# Patient Record
Sex: Male | Born: 1960 | Hispanic: Yes | Marital: Married | Smoking: Never smoker
Health system: Southern US, Community
[De-identification: ages and names within clinical notes are randomized; demographics above are authoritative.]

## PROBLEM LIST (undated history)

## (undated) DIAGNOSIS — K219 Gastro-esophageal reflux disease without esophagitis: Secondary | ICD-10-CM

## (undated) DIAGNOSIS — E119 Type 2 diabetes mellitus without complications: Secondary | ICD-10-CM

## (undated) DIAGNOSIS — I10 Essential (primary) hypertension: Secondary | ICD-10-CM

## (undated) DIAGNOSIS — E7800 Pure hypercholesterolemia, unspecified: Secondary | ICD-10-CM

## (undated) DIAGNOSIS — R600 Localized edema: Secondary | ICD-10-CM

## (undated) DIAGNOSIS — Q613 Polycystic kidney, unspecified: Secondary | ICD-10-CM

## (undated) DIAGNOSIS — E785 Hyperlipidemia, unspecified: Secondary | ICD-10-CM

## (undated) DIAGNOSIS — N179 Acute kidney failure, unspecified: Secondary | ICD-10-CM

## (undated) DIAGNOSIS — K810 Acute cholecystitis: Secondary | ICD-10-CM

## (undated) DIAGNOSIS — M1990 Unspecified osteoarthritis, unspecified site: Secondary | ICD-10-CM

## (undated) DIAGNOSIS — I872 Venous insufficiency (chronic) (peripheral): Secondary | ICD-10-CM

## (undated) DIAGNOSIS — L97209 Non-pressure chronic ulcer of unspecified calf with unspecified severity: Secondary | ICD-10-CM

## (undated) HISTORY — DX: Acute cholecystitis: K81.0

## (undated) HISTORY — DX: Essential (primary) hypertension: I10

## (undated) HISTORY — DX: Type 2 diabetes mellitus without complications: E11.9

## (undated) HISTORY — DX: Non-pressure chronic ulcer of unspecified calf with unspecified severity: L97.209

## (undated) HISTORY — PX: VEIN SURGERY: SHX48

## (undated) HISTORY — DX: Localized edema: R60.0

## (undated) HISTORY — DX: Venous insufficiency (chronic) (peripheral): I87.2

---

## 2006-09-18 ENCOUNTER — Other Ambulatory Visit: Payer: Self-pay

## 2006-09-18 ENCOUNTER — Emergency Department: Payer: Self-pay | Admitting: Emergency Medicine

## 2007-04-12 ENCOUNTER — Ambulatory Visit: Payer: Self-pay | Admitting: Family Medicine

## 2011-03-31 ENCOUNTER — Emergency Department: Payer: Self-pay | Admitting: Emergency Medicine

## 2011-05-17 ENCOUNTER — Emergency Department: Payer: Self-pay

## 2011-06-27 ENCOUNTER — Emergency Department: Payer: Self-pay | Admitting: Emergency Medicine

## 2011-06-27 LAB — URINALYSIS, COMPLETE
Bacteria: NONE SEEN
Glucose,UR: NEGATIVE mg/dL (ref 0–75)
Ketone: NEGATIVE
Protein: NEGATIVE
Specific Gravity: 1.019 (ref 1.003–1.030)

## 2011-09-21 ENCOUNTER — Emergency Department: Payer: Self-pay | Admitting: Emergency Medicine

## 2011-10-02 ENCOUNTER — Ambulatory Visit: Payer: Self-pay | Admitting: Internal Medicine

## 2011-11-10 ENCOUNTER — Encounter: Payer: Self-pay | Admitting: Cardiothoracic Surgery

## 2011-11-10 ENCOUNTER — Encounter: Payer: Self-pay | Admitting: Nurse Practitioner

## 2011-12-03 ENCOUNTER — Encounter: Payer: Self-pay | Admitting: Cardiothoracic Surgery

## 2011-12-03 ENCOUNTER — Encounter: Payer: Self-pay | Admitting: Nurse Practitioner

## 2012-03-06 ENCOUNTER — Encounter: Payer: Self-pay | Admitting: Cardiothoracic Surgery

## 2012-03-06 ENCOUNTER — Encounter: Payer: Self-pay | Admitting: Nurse Practitioner

## 2012-04-03 ENCOUNTER — Encounter: Payer: Self-pay | Admitting: Cardiothoracic Surgery

## 2012-04-03 ENCOUNTER — Encounter: Payer: Self-pay | Admitting: Nurse Practitioner

## 2013-01-04 ENCOUNTER — Ambulatory Visit: Payer: Self-pay | Admitting: Family Medicine

## 2013-08-29 DIAGNOSIS — L97209 Non-pressure chronic ulcer of unspecified calf with unspecified severity: Secondary | ICD-10-CM | POA: Insufficient documentation

## 2013-08-29 HISTORY — DX: Non-pressure chronic ulcer of unspecified calf with unspecified severity: L97.209

## 2014-01-02 DIAGNOSIS — R600 Localized edema: Secondary | ICD-10-CM

## 2014-01-02 HISTORY — DX: Localized edema: R60.0

## 2014-01-16 DIAGNOSIS — I872 Venous insufficiency (chronic) (peripheral): Secondary | ICD-10-CM

## 2014-01-16 HISTORY — DX: Venous insufficiency (chronic) (peripheral): I87.2

## 2016-12-09 DIAGNOSIS — R112 Nausea with vomiting, unspecified: Secondary | ICD-10-CM | POA: Diagnosis not present

## 2016-12-09 DIAGNOSIS — R1013 Epigastric pain: Secondary | ICD-10-CM | POA: Insufficient documentation

## 2016-12-09 DIAGNOSIS — I10 Essential (primary) hypertension: Secondary | ICD-10-CM | POA: Diagnosis not present

## 2016-12-09 DIAGNOSIS — E119 Type 2 diabetes mellitus without complications: Secondary | ICD-10-CM | POA: Insufficient documentation

## 2016-12-09 DIAGNOSIS — E860 Dehydration: Secondary | ICD-10-CM | POA: Diagnosis not present

## 2016-12-09 DIAGNOSIS — N289 Disorder of kidney and ureter, unspecified: Secondary | ICD-10-CM | POA: Diagnosis not present

## 2016-12-09 DIAGNOSIS — R109 Unspecified abdominal pain: Secondary | ICD-10-CM | POA: Diagnosis present

## 2016-12-10 ENCOUNTER — Emergency Department
Admission: EM | Admit: 2016-12-10 | Discharge: 2016-12-10 | Disposition: A | Discharge status: Home / Self Care | Payer: Medicaid Other | Attending: Emergency Medicine | Admitting: Emergency Medicine

## 2016-12-10 ENCOUNTER — Encounter: Payer: Self-pay | Admitting: Radiology

## 2016-12-10 ENCOUNTER — Emergency Department: Payer: Medicaid Other

## 2016-12-10 ENCOUNTER — Encounter: Payer: Self-pay | Admitting: Emergency Medicine

## 2016-12-10 ENCOUNTER — Inpatient Hospital Stay
Admission: EM | Admit: 2016-12-10 | Discharge: 2016-12-13 | DRG: 418 | Disposition: A | Discharge status: Home / Self Care | Payer: Medicaid Other | Attending: General Surgery | Admitting: General Surgery

## 2016-12-10 DIAGNOSIS — Z23 Encounter for immunization: Secondary | ICD-10-CM

## 2016-12-10 DIAGNOSIS — E118 Type 2 diabetes mellitus with unspecified complications: Secondary | ICD-10-CM

## 2016-12-10 DIAGNOSIS — Z6841 Body Mass Index (BMI) 40.0 and over, adult: Secondary | ICD-10-CM

## 2016-12-10 DIAGNOSIS — E119 Type 2 diabetes mellitus without complications: Secondary | ICD-10-CM

## 2016-12-10 DIAGNOSIS — K819 Cholecystitis, unspecified: Secondary | ICD-10-CM

## 2016-12-10 DIAGNOSIS — E785 Hyperlipidemia, unspecified: Secondary | ICD-10-CM | POA: Diagnosis present

## 2016-12-10 DIAGNOSIS — E7800 Pure hypercholesterolemia, unspecified: Secondary | ICD-10-CM | POA: Diagnosis present

## 2016-12-10 DIAGNOSIS — R0602 Shortness of breath: Secondary | ICD-10-CM

## 2016-12-10 DIAGNOSIS — R112 Nausea with vomiting, unspecified: Secondary | ICD-10-CM

## 2016-12-10 DIAGNOSIS — Z01811 Encounter for preprocedural respiratory examination: Secondary | ICD-10-CM

## 2016-12-10 DIAGNOSIS — K810 Acute cholecystitis: Principal | ICD-10-CM | POA: Diagnosis present

## 2016-12-10 DIAGNOSIS — E860 Dehydration: Secondary | ICD-10-CM

## 2016-12-10 DIAGNOSIS — K219 Gastro-esophageal reflux disease without esophagitis: Secondary | ICD-10-CM | POA: Diagnosis present

## 2016-12-10 DIAGNOSIS — R1013 Epigastric pain: Secondary | ICD-10-CM

## 2016-12-10 DIAGNOSIS — I10 Essential (primary) hypertension: Secondary | ICD-10-CM | POA: Diagnosis present

## 2016-12-10 DIAGNOSIS — N289 Disorder of kidney and ureter, unspecified: Secondary | ICD-10-CM

## 2016-12-10 DIAGNOSIS — Z833 Family history of diabetes mellitus: Secondary | ICD-10-CM

## 2016-12-10 HISTORY — DX: Type 2 diabetes mellitus without complications: E11.9

## 2016-12-10 HISTORY — DX: Gastro-esophageal reflux disease without esophagitis: K21.9

## 2016-12-10 HISTORY — DX: Essential (primary) hypertension: I10

## 2016-12-10 HISTORY — DX: Unspecified osteoarthritis, unspecified site: M19.90

## 2016-12-10 HISTORY — DX: Hyperlipidemia, unspecified: E78.5

## 2016-12-10 HISTORY — DX: Polycystic kidney, unspecified: Q61.3

## 2016-12-10 HISTORY — DX: Acute kidney failure, unspecified: N17.9

## 2016-12-10 HISTORY — DX: Pure hypercholesterolemia, unspecified: E78.00

## 2016-12-10 LAB — CBC WITH DIFFERENTIAL/PLATELET
Basophils Absolute: 0.1 10*3/uL (ref 0–0.1)
Basophils Relative: 1 %
EOS ABS: 0 10*3/uL (ref 0–0.7)
Eosinophils Relative: 0 %
HEMATOCRIT: 41.1 % (ref 40.0–52.0)
HEMOGLOBIN: 14.5 g/dL (ref 13.0–18.0)
Lymphocytes Relative: 5 %
Lymphs Abs: 1.2 10*3/uL (ref 1.0–3.6)
MCH: 31.2 pg (ref 26.0–34.0)
MCHC: 35.2 g/dL (ref 32.0–36.0)
MCV: 88.7 fL (ref 80.0–100.0)
MONOS PCT: 5 %
Monocytes Absolute: 1.2 10*3/uL — ABNORMAL HIGH (ref 0.2–1.0)
NEUTROS PCT: 89 %
Neutro Abs: 21.3 10*3/uL — ABNORMAL HIGH (ref 1.4–6.5)
Platelets: 210 10*3/uL (ref 150–440)
RBC: 4.63 MIL/uL (ref 4.40–5.90)
RDW: 12.8 % (ref 11.5–14.5)
WBC: 23.9 10*3/uL — ABNORMAL HIGH (ref 3.8–10.6)

## 2016-12-10 LAB — URINALYSIS, COMPLETE (UACMP) WITH MICROSCOPIC
BACTERIA UA: NONE SEEN
Bilirubin Urine: NEGATIVE
Glucose, UA: >=500 mg/dL — AB
Ketones, ur: NEGATIVE mg/dL
Leukocytes, UA: NEGATIVE
Nitrite: NEGATIVE
Protein, ur: 30 mg/dL — AB
SPECIFIC GRAVITY, URINE: 1.024 (ref 1.005–1.030)
SQUAMOUS EPITHELIAL / LPF: NONE SEEN
pH: 5 (ref 5.0–8.0)

## 2016-12-10 LAB — CBC
HCT: 45.5 % (ref 40.0–52.0)
HEMOGLOBIN: 15.9 g/dL (ref 13.0–18.0)
MCH: 31.1 pg (ref 26.0–34.0)
MCHC: 35 g/dL (ref 32.0–36.0)
MCV: 88.9 fL (ref 80.0–100.0)
Platelets: 249 10*3/uL (ref 150–440)
RBC: 5.11 MIL/uL (ref 4.40–5.90)
RDW: 13.1 % (ref 11.5–14.5)
WBC: 15.3 10*3/uL — AB (ref 3.8–10.6)

## 2016-12-10 LAB — COMPREHENSIVE METABOLIC PANEL
ALBUMIN: 3.6 g/dL (ref 3.5–5.0)
ALBUMIN: 4.4 g/dL (ref 3.5–5.0)
ALK PHOS: 121 U/L (ref 38–126)
ALT: 20 U/L (ref 17–63)
ALT: 29 U/L (ref 17–63)
ANION GAP: 10 (ref 5–15)
AST: 19 U/L (ref 15–41)
AST: 32 U/L (ref 15–41)
Alkaline Phosphatase: 86 U/L (ref 38–126)
Anion gap: 8 (ref 5–15)
BILIRUBIN TOTAL: 0.7 mg/dL (ref 0.3–1.2)
BUN: 20 mg/dL (ref 6–20)
BUN: 43 mg/dL — AB (ref 6–20)
CALCIUM: 9.3 mg/dL (ref 8.9–10.3)
CHLORIDE: 98 mmol/L — AB (ref 101–111)
CO2: 22 mmol/L (ref 22–32)
CO2: 25 mmol/L (ref 22–32)
Calcium: 8.9 mg/dL (ref 8.9–10.3)
Chloride: 101 mmol/L (ref 101–111)
Creatinine, Ser: 0.97 mg/dL (ref 0.61–1.24)
Creatinine, Ser: 1.59 mg/dL — ABNORMAL HIGH (ref 0.61–1.24)
GFR calc Af Amer: 54 mL/min — ABNORMAL LOW (ref >60)
GFR calc Af Amer: >60 mL/min (ref >60)
GFR calc non Af Amer: >60 mL/min (ref >60)
GFR, EST NON AFRICAN AMERICAN: 46 mL/min — AB (ref >60)
GLUCOSE: 336 mg/dL — AB (ref 65–99)
GLUCOSE: 385 mg/dL — AB (ref 65–99)
POTASSIUM: 4.2 mmol/L (ref 3.5–5.1)
POTASSIUM: 4.5 mmol/L (ref 3.5–5.1)
Sodium: 131 mmol/L — ABNORMAL LOW (ref 135–145)
Sodium: 133 mmol/L — ABNORMAL LOW (ref 135–145)
TOTAL PROTEIN: 8.7 g/dL — AB (ref 6.5–8.1)
Total Bilirubin: 1.4 mg/dL — ABNORMAL HIGH (ref 0.3–1.2)
Total Protein: 7.6 g/dL (ref 6.5–8.1)

## 2016-12-10 LAB — LIPASE, BLOOD
LIPASE: 47 U/L (ref 11–51)
Lipase: 37 U/L (ref 11–51)

## 2016-12-10 LAB — ETHANOL

## 2016-12-10 LAB — LACTIC ACID, PLASMA: LACTIC ACID, VENOUS: 1.6 mmol/L (ref 0.5–1.9)

## 2016-12-10 LAB — TROPONIN I

## 2016-12-10 MED ORDER — MORPHINE SULFATE (PF) 4 MG/ML IV SOLN
4.0000 mg | Freq: Once | INTRAVENOUS | Status: AC
  Administered 2016-12-10: 4 mg via INTRAVENOUS
  Filled 2016-12-10: qty 1

## 2016-12-10 MED ORDER — ONDANSETRON 4 MG PO TBDP: 4.0000 mg | ORAL_TABLET | Freq: Three times a day (TID) | ORAL | 0 refills | Status: DC | PRN

## 2016-12-10 MED ORDER — FENTANYL CITRATE (PF) 100 MCG/2ML IJ SOLN
50.0000 ug | Freq: Once | INTRAMUSCULAR | Status: AC
  Administered 2016-12-10: 50 ug via INTRAVENOUS
  Filled 2016-12-10: qty 2

## 2016-12-10 MED ORDER — HYDROMORPHONE HCL 1 MG/ML IJ SOLN
0.5000 mg | Freq: Once | INTRAMUSCULAR | Status: AC
  Administered 2016-12-10: 0.5 mg via INTRAVENOUS
  Filled 2016-12-10: qty 1

## 2016-12-10 MED ORDER — ONDANSETRON HCL 4 MG/2ML IJ SOLN
4.0000 mg | Freq: Once | INTRAMUSCULAR | Status: AC
  Administered 2016-12-10: 4 mg via INTRAVENOUS
  Filled 2016-12-10: qty 2

## 2016-12-10 MED ORDER — SODIUM CHLORIDE 0.9 % IV BOLUS (SEPSIS)
1000.0000 mL | Freq: Once | INTRAVENOUS | Status: AC
  Administered 2016-12-10: 1000 mL via INTRAVENOUS

## 2016-12-10 MED ORDER — FAMOTIDINE IN NACL 20-0.9 MG/50ML-% IV SOLN
20.0000 mg | Freq: Once | INTRAVENOUS | Status: AC
  Administered 2016-12-10: 20 mg via INTRAVENOUS
  Filled 2016-12-10: qty 50

## 2016-12-10 MED ORDER — PIPERACILLIN-TAZOBACTAM 3.375 G IVPB 30 MIN
3.3750 g | Freq: Once | INTRAVENOUS | Status: AC
  Administered 2016-12-10: 3.375 g via INTRAVENOUS

## 2016-12-10 MED ORDER — SODIUM CHLORIDE 0.9 % IV BOLUS (SEPSIS)
1000.0000 mL | INTRAVENOUS | Status: AC
  Administered 2016-12-10: 1000 mL via INTRAVENOUS

## 2016-12-10 MED ORDER — ONDANSETRON HCL 4 MG/2ML IJ SOLN
4.0000 mg | INTRAMUSCULAR | Status: AC
  Administered 2016-12-10: 4 mg via INTRAVENOUS
  Filled 2016-12-10: qty 2

## 2016-12-10 MED ORDER — TRAMADOL HCL 50 MG PO TABS: 50.0000 mg | ORAL_TABLET | Freq: Four times a day (QID) | ORAL | 0 refills | Status: DC | PRN

## 2016-12-10 MED ORDER — IOPAMIDOL (ISOVUE-300) INJECTION 61%
100.0000 mL | Freq: Once | INTRAVENOUS | Status: AC | PRN
  Administered 2016-12-10: 100 mL via INTRAVENOUS

## 2016-12-10 MED ORDER — PIPERACILLIN-TAZOBACTAM 3.375 G IVPB 30 MIN
INTRAVENOUS | Status: AC
  Administered 2016-12-10: 3.375 g via INTRAVENOUS
  Filled 2016-12-10: qty 50

## 2016-12-10 NOTE — ED Triage Notes (Signed)
Pt was seen here last night and discharged at 5am, pt states the abd pain has not improved..states pain started around 5pm yesterday..Marland Kitchen

## 2016-12-10 NOTE — ED Triage Notes (Signed)
Pt to tx room direct from triage, report epigastric pain x 6 hours with vomiting x 3, denies diarrhea.    Triage and assessment completed w/ help of tele-interpreter

## 2016-12-10 NOTE — ED Provider Notes (Signed)
[facility] Emergency Department Provider Note   ____________________________________________   First MD Initiated Contact with Patient 12/10/16 0010     (approximate)  I have reviewed the triage vital signs and the nursing notes.   HISTORY  Chief Complaint Abdominal Pain    HPI Richard Hunt is a 56 y.o. male who presents to the ED from home with a chief complaint of abdominal pain, nausea and vomiting. Patient reports onset of epigastric pain approximately 6 PM after eating beef and tortillas.notes sharp, nonradiating epigastric pain which has been constant. Symptoms associated with nausea and vomiting 3. Denies associated fever, chills, chest pain, shortness of breath, dysuria, diarrhea. Denies recent travel or trauma. Nothing makes his symptoms better or worse. No similar symptoms previously.   Past Medical History:  Diagnosis Date  . Diabetes mellitus without complication (HCC)   . Hypertension     There are no active problems to display for this patient.   History reviewed. No pertinent surgical history.  Prior to Admission medications   Not on File    Allergies Patient has no known allergies.  History reviewed. No pertinent family history.  Social History Social History  Substance Use Topics  . Smoking status: Never Smoker  . Smokeless tobacco: Never Used  . Alcohol use No    Review of Systems  Constitutional: No fever/chills. Eyes: No visual changes. ENT: No sore throat. Cardiovascular: Denies chest pain. Respiratory: Denies shortness of breath. Gastrointestinal: positive for abdominal pain, nausea and vomiting.  No diarrhea.  No constipation. Genitourinary: Negative for dysuria. Musculoskeletal: Negative for back pain. Skin: Negative for rash. Neurological: Negative for headaches, focal weakness or numbness.   ____________________________________________   PHYSICAL EXAM:  VITAL SIGNS: ED Triage Vitals   Enc Vitals Group     BP 12/10/16 0008 (!) 190/97     Pulse Rate 12/10/16 0008 89     Resp 12/10/16 0008 18     Temp 12/10/16 0008 97.7 F (36.5 C)     Temp Source 12/10/16 0008 Oral     SpO2 12/10/16 0008 100 %     Weight 12/10/16 0013 240 lb (108.9 kg)     Height 12/10/16 0013  (1.651 m)     Head Circumference --      Peak Flow --      Pain Score 12/10/16 0013 10     Pain Loc --      Pain Edu? --      Excl. in GC? --     Constitutional: Alert and oriented. Well appearing and in moderate acute distress. Tearful. Eyes: Conjunctivae are normal. PERRL. EOMI. Head: Atraumatic. Nose: No congestion/rhinnorhea. Mouth/Throat: Mucous membranes are moist.  Oropharynx non-erythematous. Neck: No stridor.   Cardiovascular: Normal rate, regular rhythm. Grossly normal heart sounds.  Good peripheral circulation. Respiratory: Normal respiratory effort.  No retractions. Lungs CTAB. Gastrointestinal: Soft and moderately tender to palpation epigastrium with voluntary guarding, no rebound. No distention. No abdominal bruits. No CVA tenderness. Musculoskeletal: No lower extremity tenderness nor edema.  No joint effusions. Neurologic:  Normal speech and language. No gross focal neurologic deficits are appreciated.  Skin:  Skin is warm, dry and intact. No rash noted. Psychiatric: Mood and affect are normal. Speech and behavior are normal.  ____________________________________________   LABS (all labs ordered are listed, but only abnormal results are displayed)  Labs Reviewed  COMPREHENSIVE METABOLIC PANEL - Abnormal; Notable for the following:       Result Value  Sodium 133 (*)    Glucose, Bld 385 (*)    BUN 43 (*)    Creatinine, Ser 1.59 (*)    Total Protein 8.7 (*)    GFR calc non Af Amer 46 (*)    GFR calc Af Amer 54 (*)    All other components within normal limits  CBC - Abnormal; Notable for the following:    WBC 15.3 (*)    All other components within normal limits    URINALYSIS, COMPLETE (UACMP) WITH MICROSCOPIC - Abnormal; Notable for the following:    Color, Urine STRAW (*)    APPearance CLEAR (*)    Glucose, UA >=500 (*)    Hgb urine dipstick SMALL (*)    Protein, ur 30 (*)    All other components within normal limits  LIPASE, BLOOD  TROPONIN I  ETHANOL   ____________________________________________  EKG  ED ECG REPORT I, Altan Kraai J, the attending physician, personally viewed and interpreted this ECG.   Date: 12/10/2016  EKG Time: 0003  Rate: 86  Rhythm: normal EKG, normal sinus rhythm  Axis: normal  Intervals:none  ST&T Change: nonspecific  ____________________________________________  RADIOLOGY  US Abdomen Limited Ruq  Result Date: 12/10/2016 CLINICAL DATA:  Epigastric pain.  Nausea and vomiting. EXAM: ULTRASOUND ABDOMEN LIMITED RIGHT UPPER QUADRANT COMPARISON:  None. FINDINGS: Gallbladder: Physiologically distended. Layering sludge without shadowing stone. No gallbladder wall thickening or pericholecystic fluid. No sonographic Murphy sign noted by sonographer. Common bile duct: Diameter: 6 mm. Liver: Increased heterogeneous hepatic parenchyma, difficult to penetrate. No evidence of gross focal lesion. Portal vein is patent on color Doppler imaging with normal direction of blood flow towards the liver. IMPRESSION: 1. Gallbladder sludge without gallstones or gallbladder inflammation. No biliary dilatation. 2. Hepatic steatosis, liver parenchyma is difficult to penetrate. Electronically Signed   By: Rubye Oaks M.D.   On: 12/10/2016 02:28    ____________________________________________   PROCEDURES  Procedure(s) performed: None  Procedures  Critical Care performed: No  ____________________________________________   INITIAL IMPRESSION / ASSESSMENT AND PLAN / ED COURSE  Pertinent labs & imaging results that were available during my care of the patient were reviewed by me and considered in my medical decision making (see  chart for details).  56 year old male with diabetes and hypertension who presents with epigastric pain with vomiting. Laboratory results notable for leukocytosis, hyperglycemia and renal insufficiency. No prior labs for comparison. Symptoms clinically suspicious for cholecystitis. No relief after initial dose of IV morphine. Will administer additional analgesia and obtain abdominal ultrasound.  Clinical Course as of Dec 10 501  Wynelle Link Dec 10, 2016  1610 Updated patient and family members of ultrasound results. I had the chance to review ultrasound results directly with surgeon on call Dr. Earlene Plater. Currently there is no indication that patient requires urgent surgical intervention. Dr. Earlene Plater did recommend fentanyl for analgesic and conversion to oral tramadol. Patient's pain is improved and currently 7/10 after Dilaudid. he is no longer tearful and smiling. Will administer fentanyl bolus and reassess.  [JS]  0501 Pain is much improved. patient was sleeping comfortably and had to be awakened for reexamination. Will discharge home on tramadol with outpatient surgery follow-up. Strict return precautions given. Patient and family members verbalize understanding and agree with plan of care.  [JS]    Clinical Course User Index [JS] Irean Hong, MD     ____________________________________________   FINAL CLINICAL IMPRESSION(S) / ED DIAGNOSES  Final diagnoses:  Epigastric pain  Non-intractable vomiting with nausea, unspecified vomiting  type  Renal insufficiency  Dehydration      NEW MEDICATIONS STARTED DURING THIS VISIT:  New Prescriptions   No medications on file     Note:  This document was prepared using Dragon voice recognition software and may include unintentional dictation errors.    Irean HongSung, Rheagan Nayak J, MD 12/10/16 [phone]

## 2016-12-10 NOTE — ED Notes (Signed)
Pt actively vomiting.

## 2016-12-10 NOTE — ED Notes (Signed)
Pt taken to US

## 2016-12-10 NOTE — Discharge Instructions (Signed)
1. You may take pain and nausea medicines as needed (tramadol/Zofran #20). 2. Parke SimmersBland diet 5 days, then slowly advance diet as tolerated. Avoid fatty, greasy, spicy meals and alcohol. 3. Return to the ER for worsening symptoms, persistent vomiting, difficulty breathing or other concerns.

## 2016-12-10 NOTE — ED Provider Notes (Signed)
[facility] Emergency Department Provider Note  ____________________________________________   First MD Initiated Contact with Patient 12/10/16 2226     (approximate)  I have reviewed the triage vital signs and the nursing notes.   HISTORY  Chief Complaint Abdominal Pain  The patient and/or family speak(s) Spanish.  They understand they have the right to the use of a hospital interpreter, however at this time they prefer to speak directly with me in Spanish.  They know that they can ask for an interpreter at any time.   HPI Richard Hunt is a 56 y.o. male with medical history as listed below presents for reevaluation of abdominal pain.  He was seen overnight last night for the same symptoms.His symptoms started acutely yesterday evening shortly after eating dinner.  He reports sharp and nonradiating epigastric pain that has been constant since that time.  He has had some nausea and multiple episodes of vomiting yesterday although he has not vomited today.  He came to the emergency department and his labs were generally reassuring with a slight elevation of his creatinine and a mild leukocytosis of 15.3.  He had a generally reassuring ultrasound as well with some gallbladder sludge but no wall thickening or stones and no ductal dilatation.  The ED physician spoke with the surgeon on call and agreed that if his symptoms were adequately controlled with pain medicine he could follow-up as an outpatient.  Symptoms were much improved and he agreed with the plan to go home.    He reports that the pain WAS improved when he left the hospital early this morning.  He went home and went to sleep, but when he woke up his pain was back and severe.  Kidneys to be sharp and located primarily in his epigastrium and right upper quadrant.  Nothing makes his symptoms better or worse when he tried to eat.  Today he has had nausea but no more vomiting.  His pain is severe tonight  so he came back to the emergency department for reevaluation.  He is unaware of having any fevers.  He denies chest pain and shortness of breath.  He has had no diarrhea.   Past Medical History:  Diagnosis Date  . Diabetes mellitus without complication (HCC)   . Hypertension     There are no active problems to display for this patient.   No past surgical history on file.  Prior to Admission medications   Medication Sig Start Date End Date Taking? Authorizing Provider  ondansetron (ZOFRAN ODT) 4 MG disintegrating tablet Take 1 tablet (4 mg total) by mouth every 8 (eight) hours as needed for nausea or vomiting. 12/10/16  Yes Irean HongSung, Jade J, MD  traMADol (ULTRAM) 50 MG tablet Take 1 tablet (50 mg total) by mouth every 6 (six) hours as needed. 12/10/16  Yes Irean HongSung, Jade J, MD    Allergies Patient has no known allergies.  No family history on file.  Social History Social History  Substance Use Topics  . Smoking status: Never Smoker  . Smokeless tobacco: Never Used  . Alcohol use No    Review of Systems Constitutional: No fever/chills Eyes: No visual changes. ENT: No sore throat. Cardiovascular: Denies chest pain. Respiratory: Denies shortness of breath. Gastrointestinal: Severe epigastric and right upper quadrant pain with nausea, but no vomiting since yesterday.  No diarrhea. Genitourinary: Negative for dysuria. Musculoskeletal: Negative for neck pain.  Negative for back pain. Integumentary: Negative for rash. Neurological: Negative for headaches, focal  weakness or numbness.   ____________________________________________   PHYSICAL EXAM:  VITAL SIGNS: ED Triage Vitals  Enc Vitals Group     BP 12/10/16 1745 (!) 168/79     Pulse Rate 12/10/16 1745 (!) 117     Resp --      Temp 12/10/16 1745 99.8 F (37.7 C)     Temp Source 12/10/16 1745 Oral     SpO2 12/10/16 1745 94 %     Weight 12/10/16 1747 110.2 kg (243 lb)     Height 12/10/16 1747 1.651 m ( )     Head  Circumference --      Peak Flow --      Pain Score 12/10/16 1756 8     Pain Loc --      Pain Edu? --      Excl. in GC? --     Constitutional: Alert and oriented. Appears uncomfortable Eyes: Conjunctivae are normal.  Head: Atraumatic. Nose: No congestion/rhinnorhea. Mouth/Throat: Mucous membranes are moist. Neck: No stridor.  No meningeal signs.   Cardiovascular: Normal rate, regular rhythm. Good peripheral circulation. Grossly normal heart sounds. Respiratory: Normal respiratory effort.  No retractions. Lungs CTAB. Gastrointestinal: Obese.  Severe epigastric and right upper quadrant tenderness with guarding.  Mild distention. Musculoskeletal: No lower extremity tenderness nor edema. No gross deformities of extremities. Neurologic:  Normal speech and language. No gross focal neurologic deficits are appreciated.  Skin:  Skin is warm, dry and intact. No rash noted. Psychiatric: Mood and affect are normal. Speech and behavior are normal.  ____________________________________________   LABS (all labs ordered are listed, but only abnormal results are displayed)  Labs Reviewed  COMPREHENSIVE METABOLIC PANEL - Abnormal; Notable for the following:       Result Value   Sodium 131 (*)    Chloride 98 (*)    Glucose, Bld 336 (*)    Total Bilirubin 1.4 (*)    All other components within normal limits  CBC WITH DIFFERENTIAL/PLATELET - Abnormal; Notable for the following:    WBC 23.9 (*)    Neutro Abs 21.3 (*)    Monocytes Absolute 1.2 (*)    All other components within normal limits  LIPASE, BLOOD  LACTIC ACID, PLASMA  TROPONIN I  LACTIC ACID, PLASMA   ____________________________________________  EKG  ED ECG REPORT I, Natisha Trzcinski, the attending physician, personally viewed and interpreted this ECG.  Date: 12/10/2016 EKG Time: 22:09 Rate: 103 Rhythm: normal sinus rhythm QRS Axis: normal Intervals: normal ST/T Wave abnormalities: normal Narrative Interpretation: no  evidence of acute ischemia  ____________________________________________  RADIOLOGY   Ct Abdomen Pelvis W Contrast  Result Date: 12/10/2016 CLINICAL DATA:  Abdominal pain with nausea and vomiting which began at 6 p.m. tonight. EXAM: CT ABDOMEN AND PELVIS WITH CONTRAST TECHNIQUE: Multidetector CT imaging of the abdomen and pelvis was performed using the standard protocol following bolus administration of intravenous contrast. CONTRAST:  100 ml ISOVUE-300 IOPAMIDOL (ISOVUE-300) INJECTION 61% COMPARISON:  Right upper quadrant ultrasound this same day. FINDINGS: Lower chest: Mild dependent atelectasis is seen lung bases. No pleural or pericardial effusion. Heart size is normal. Hepatobiliary: Fatty infiltration of the liver is identified. No focal lesion. There is stranding about the gallbladder. No stones are identified. Pancreas: Unremarkable. No pancreatic ductal dilatation or surrounding inflammatory changes. Spleen: Normal in size without focal abnormality. Adrenals/Urinary Tract: 2 small left renal cysts are identified. There is a punctate low attenuating lesion the right kidney which is also likely a cyst. The kidneys are  otherwise unremarkable. Ureters and urinary bladder appear normal. The adrenal glands are normal appearance P Stomach/Bowel: Stomach is within normal limits. Appendix appears normal. No evidence of bowel wall thickening, distention, or inflammatory changes. Vascular/Lymphatic: Aortic atherosclerosis. No enlarged abdominal or pelvic lymph nodes. Reproductive: The prostate gland is mildly prominent. Other: No ascites. Very small fat containing umbilical hernia noted. Musculoskeletal: No lytic or sclerotic bony lesion. No acute bony abnormality. IMPRESSION: Stranding about the gallbladder worrisome for inflammatory change. Note is made the patient's right upper quadrant ultrasound showed only some gallbladder sludge. Fatty infiltration of the liver. Atherosclerosis. Mild prostatomegaly.  Very small fat containing umbilical hernia. Electronically Signed   By: Drusilla Kanner M.D.   On: 12/10/2016 23:27   US Abdomen Limited Ruq  Result Date: 12/10/2016 CLINICAL DATA:  Epigastric pain.  Nausea and vomiting. EXAM: ULTRASOUND ABDOMEN LIMITED RIGHT UPPER QUADRANT COMPARISON:  None. FINDINGS: Gallbladder: Physiologically distended. Layering sludge without shadowing stone. No gallbladder wall thickening or pericholecystic fluid. No sonographic Murphy sign noted by sonographer. Common bile duct: Diameter: 6 mm. Liver: Increased heterogeneous hepatic parenchyma, difficult to penetrate. No evidence of gross focal lesion. Portal vein is patent on color Doppler imaging with normal direction of blood flow towards the liver. IMPRESSION: 1. Gallbladder sludge without gallstones or gallbladder inflammation. No biliary dilatation. 2. Hepatic steatosis, liver parenchyma is difficult to penetrate. Electronically Signed   By: Rubye Oaks M.D.   On: 12/10/2016 02:28    ____________________________________________   PROCEDURES  Critical Care performed: No   Procedure(s) performed:   Procedures   ____________________________________________   INITIAL IMPRESSION / ASSESSMENT AND PLAN / ED COURSE  Pertinent labs & imaging results that were available during my care of the patient were reviewed by me and considered in my medical decision making (see chart for details).    Clinical Course as of Dec 10 2356  Wynelle Link Dec 10, 2016  2301 Talked to Dr. Earlene Plater with general surgery about the fact the patient's white count has gone up to 24 since last night in spite of his any function improving, his ongoing pain, etc.  He feels we should obtain a CT scan because biliary colic/gallbladder sludge would be very unlikely to cause that substantial of a leukocytosis.  I will proceed with a CT scan of the abdomen and pelvis with IV contrast only and we will reassess.  He will see the patient after the CT scan  is available for review.  In the meantime I am giving a liter of fluids, analgesia and antiemetics, and under the circumstances I will give him a dose of empiric antibiotics.  [CF]  2336 CT scan is back showing inflammatory changes around the gallbladder.  I discussed the case with Dr. Earlene Plater and he will come down to see the patient and admit him for probable cholecystectomy.  [CF]    Clinical Course User Index [CF] Loleta Rose, MD    ____________________________________________  FINAL CLINICAL IMPRESSION(S) / ED DIAGNOSES  Final diagnoses:  Cholecystitis     MEDICATIONS GIVEN DURING THIS VISIT:  Medications  sodium chloride 0.9 % bolus 1,000 mL (1,000 mLs Intravenous New Bag/Given 12/10/16 2327)  fentaNYL (SUBLIMAZE) injection 50 mcg (50 mcg Intravenous Given 12/10/16 2328)  ondansetron (ZOFRAN) injection 4 mg (4 mg Intravenous Given 12/10/16 2327)  piperacillin-tazobactam (ZOSYN) IVPB 3.375 g (3.375 g Intravenous New Bag/Given 12/10/16 2328)  iopamidol (ISOVUE-300) 61 % injection 100 mL (100 mLs Intravenous Contrast Given 12/10/16 2308)     NEW OUTPATIENT MEDICATIONS STARTED  DURING THIS VISIT:  New Prescriptions   No medications on file    Modified Medications   No medications on file    Discontinued Medications   No medications on file     Note:  This document was prepared using Dragon voice recognition software and may include unintentional dictation errors.    Loleta Rose, MD 12/10/16 2358

## 2016-12-11 ENCOUNTER — Observation Stay: Payer: Medicaid Other | Admitting: Anesthesiology

## 2016-12-11 ENCOUNTER — Observation Stay: Payer: Medicaid Other

## 2016-12-11 ENCOUNTER — Encounter
Admission: EM | Disposition: A | Discharge status: Home / Self Care | Payer: Self-pay | Source: Home / Self Care | Attending: General Surgery

## 2016-12-11 ENCOUNTER — Encounter: Payer: Self-pay | Admitting: Internal Medicine

## 2016-12-11 DIAGNOSIS — I10 Essential (primary) hypertension: Secondary | ICD-10-CM

## 2016-12-11 DIAGNOSIS — K810 Acute cholecystitis: Secondary | ICD-10-CM | POA: Diagnosis not present

## 2016-12-11 DIAGNOSIS — E119 Type 2 diabetes mellitus without complications: Secondary | ICD-10-CM

## 2016-12-11 DIAGNOSIS — E785 Hyperlipidemia, unspecified: Secondary | ICD-10-CM | POA: Diagnosis present

## 2016-12-11 HISTORY — DX: Type 2 diabetes mellitus without complications: E11.9

## 2016-12-11 HISTORY — DX: Acute cholecystitis: K81.0

## 2016-12-11 HISTORY — PX: CHOLECYSTECTOMY: SHX55

## 2016-12-11 HISTORY — DX: Essential (primary) hypertension: I10

## 2016-12-11 LAB — COMPREHENSIVE METABOLIC PANEL
ALBUMIN: 3.3 g/dL — AB (ref 3.5–5.0)
ALK PHOS: 79 U/L (ref 38–126)
ALT: 29 U/L (ref 17–63)
AST: 30 U/L (ref 15–41)
Anion gap: 8 (ref 5–15)
BILIRUBIN TOTAL: 1.8 mg/dL — AB (ref 0.3–1.2)
BUN: 20 mg/dL (ref 6–20)
CALCIUM: 8.4 mg/dL — AB (ref 8.9–10.3)
CO2: 25 mmol/L (ref 22–32)
CREATININE: 1.08 mg/dL (ref 0.61–1.24)
Chloride: 99 mmol/L — ABNORMAL LOW (ref 101–111)
GFR calc Af Amer: >60 mL/min (ref >60)
GFR calc non Af Amer: >60 mL/min (ref >60)
GLUCOSE: 346 mg/dL — AB (ref 65–99)
Potassium: 4.3 mmol/L (ref 3.5–5.1)
SODIUM: 132 mmol/L — AB (ref 135–145)
Total Protein: 7 g/dL (ref 6.5–8.1)

## 2016-12-11 LAB — LIPID PANEL
CHOL/HDL RATIO: 3.4 ratio
Cholesterol: 156 mg/dL (ref 0–200)
HDL: 46 mg/dL (ref >40)
LDL CALC: 85 mg/dL (ref 0–99)
Triglycerides: 127 mg/dL (ref <150)
VLDL: 25 mg/dL (ref 0–40)

## 2016-12-11 LAB — SURGICAL PCR SCREEN
MRSA, PCR: NEGATIVE
Staphylococcus aureus: POSITIVE — AB

## 2016-12-11 LAB — CBC
HCT: 38 % — ABNORMAL LOW (ref 40.0–52.0)
Hemoglobin: 13.6 g/dL (ref 13.0–18.0)
MCH: 31.9 pg (ref 26.0–34.0)
MCHC: 35.8 g/dL (ref 32.0–36.0)
MCV: 89 fL (ref 80.0–100.0)
PLATELETS: 182 10*3/uL (ref 150–440)
RBC: 4.27 MIL/uL — ABNORMAL LOW (ref 4.40–5.90)
RDW: 12.9 % (ref 11.5–14.5)
WBC: 22.2 10*3/uL — ABNORMAL HIGH (ref 3.8–10.6)

## 2016-12-11 LAB — GLUCOSE, CAPILLARY
GLUCOSE-CAPILLARY: 297 mg/dL — AB (ref 65–99)
GLUCOSE-CAPILLARY: 236 mg/dL — AB (ref 65–99)
GLUCOSE-CAPILLARY: 316 mg/dL — AB (ref 65–99)
Glucose-Capillary: 295 mg/dL — ABNORMAL HIGH (ref 65–99)
Glucose-Capillary: 214 mg/dL — ABNORMAL HIGH (ref 65–99)
Glucose-Capillary: 277 mg/dL — ABNORMAL HIGH (ref 65–99)
Glucose-Capillary: 164 mg/dL — ABNORMAL HIGH (ref 65–99)

## 2016-12-11 LAB — TROPONIN I
TROPONIN I: 0.06 ng/mL — AB (ref <0.03)
Troponin I: <0.03 ng/mL (ref <0.03)

## 2016-12-11 LAB — LACTIC ACID, PLASMA: LACTIC ACID, VENOUS: 1.3 mmol/L (ref 0.5–1.9)

## 2016-12-11 SURGERY — LAPAROSCOPIC CHOLECYSTECTOMY
Anesthesia: General | Wound class: Clean Contaminated

## 2016-12-11 MED ORDER — DEXTROSE 5 % IV SOLN
2.0000 g | INTRAVENOUS | Status: DC
  Administered 2016-12-11: 2 g via INTRAVENOUS
  Filled 2016-12-11 (×3): qty 2

## 2016-12-11 MED ORDER — LIDOCAINE HCL (PF) 1 % IJ SOLN
INTRAMUSCULAR | Status: AC
  Filled 2016-12-11: qty 30

## 2016-12-11 MED ORDER — MUPIROCIN 2 % EX OINT
1.0000 "application " | TOPICAL_OINTMENT | Freq: Two times a day (BID) | CUTANEOUS | Status: DC
  Administered 2016-12-11 – 2016-12-13 (×5): 1 via NASAL
  Filled 2016-12-11: qty 22

## 2016-12-11 MED ORDER — ROCURONIUM BROMIDE 50 MG/5ML IV SOLN
INTRAVENOUS | Status: AC
  Filled 2016-12-11: qty 1

## 2016-12-11 MED ORDER — BUPIVACAINE HCL (PF) 0.5 % IJ SOLN
INTRAMUSCULAR | Status: AC
  Filled 2016-12-11: qty 30

## 2016-12-11 MED ORDER — ACETAMINOPHEN 325 MG PO TABS
650.0000 mg | ORAL_TABLET | Freq: Four times a day (QID) | ORAL | Status: DC | PRN
  Administered 2016-12-13: 650 mg via ORAL
  Filled 2016-12-11: qty 2

## 2016-12-11 MED ORDER — FENTANYL CITRATE (PF) 100 MCG/2ML IJ SOLN
50.0000 ug | INTRAMUSCULAR | Status: DC | PRN
  Administered 2016-12-11: 50 ug via INTRAVENOUS
  Filled 2016-12-11: qty 2

## 2016-12-11 MED ORDER — ONDANSETRON HCL 4 MG/2ML IJ SOLN
4.0000 mg | Freq: Four times a day (QID) | INTRAMUSCULAR | Status: DC | PRN
  Administered 2016-12-11: 4 mg via INTRAVENOUS
  Filled 2016-12-11: qty 2

## 2016-12-11 MED ORDER — ONDANSETRON HCL 4 MG/2ML IJ SOLN
INTRAMUSCULAR | Status: AC
  Filled 2016-12-11: qty 2

## 2016-12-11 MED ORDER — ENOXAPARIN SODIUM 40 MG/0.4ML ~~LOC~~ SOLN
40.0000 mg | SUBCUTANEOUS | Status: DC
  Administered 2016-12-12 – 2016-12-13 (×2): 40 mg via SUBCUTANEOUS
  Filled 2016-12-11 (×2): qty 0.4

## 2016-12-11 MED ORDER — FENTANYL CITRATE (PF) 100 MCG/2ML IJ SOLN
25.0000 ug | INTRAMUSCULAR | Status: DC | PRN
  Administered 2016-12-11: 50 ug via INTRAVENOUS

## 2016-12-11 MED ORDER — FENTANYL CITRATE (PF) 100 MCG/2ML IJ SOLN
INTRAMUSCULAR | Status: AC
  Filled 2016-12-11: qty 2

## 2016-12-11 MED ORDER — INSULIN ASPART 100 UNIT/ML ~~LOC~~ SOLN
0.0000 [IU] | SUBCUTANEOUS | Status: DC
  Administered 2016-12-11: 15 [IU] via SUBCUTANEOUS
  Administered 2016-12-11: 4 [IU] via SUBCUTANEOUS
  Administered 2016-12-11 (×3): 11 [IU] via SUBCUTANEOUS
  Administered 2016-12-12: 7 [IU] via SUBCUTANEOUS
  Administered 2016-12-12: 4 [IU] via SUBCUTANEOUS
  Administered 2016-12-12: 3 [IU] via SUBCUTANEOUS
  Administered 2016-12-12: 7 [IU] via SUBCUTANEOUS
  Administered 2016-12-12: 3 [IU] via SUBCUTANEOUS
  Administered 2016-12-12: 7 [IU] via SUBCUTANEOUS
  Administered 2016-12-13 (×2): 3 [IU] via SUBCUTANEOUS
  Administered 2016-12-13: 4 [IU] via SUBCUTANEOUS
  Filled 2016-12-11 (×13): qty 1

## 2016-12-11 MED ORDER — PROPOFOL 10 MG/ML IV BOLUS
INTRAVENOUS | Status: AC
  Filled 2016-12-11: qty 20

## 2016-12-11 MED ORDER — LACTATED RINGERS IV SOLN
INTRAVENOUS | Status: DC
  Administered 2016-12-11 – 2016-12-12 (×6): via INTRAVENOUS

## 2016-12-11 MED ORDER — PNEUMOCOCCAL VAC POLYVALENT 25 MCG/0.5ML IJ INJ
0.5000 mL | INJECTION | INTRAMUSCULAR | Status: AC
  Administered 2016-12-12: 0.5 mL via INTRAMUSCULAR
  Filled 2016-12-11: qty 0.5

## 2016-12-11 MED ORDER — OXYCODONE HCL 5 MG/5ML PO SOLN: 5.0000 mg | Freq: Once | ORAL | Status: DC | PRN

## 2016-12-11 MED ORDER — FENTANYL CITRATE (PF) 100 MCG/2ML IJ SOLN
INTRAMUSCULAR | Status: DC | PRN
  Administered 2016-12-11: 100 ug via INTRAVENOUS
  Administered 2016-12-11 (×3): 50 ug via INTRAVENOUS

## 2016-12-11 MED ORDER — OXYCODONE HCL 5 MG PO TABS: 5.0000 mg | ORAL_TABLET | Freq: Once | ORAL | Status: DC | PRN

## 2016-12-11 MED ORDER — SUGAMMADEX SODIUM 500 MG/5ML IV SOLN
INTRAVENOUS | Status: AC
  Filled 2016-12-11: qty 5

## 2016-12-11 MED ORDER — INSULIN GLARGINE 100 UNIT/ML ~~LOC~~ SOLN
20.0000 [IU] | Freq: Every day | SUBCUTANEOUS | Status: DC
  Administered 2016-12-11 – 2016-12-12 (×2): 20 [IU] via SUBCUTANEOUS
  Filled 2016-12-11 (×3): qty 0.2

## 2016-12-11 MED ORDER — MIDAZOLAM HCL 2 MG/2ML IJ SOLN
INTRAMUSCULAR | Status: DC | PRN
  Administered 2016-12-11: 2 mg via INTRAVENOUS

## 2016-12-11 MED ORDER — LIDOCAINE HCL (PF) 2 % IJ SOLN
INTRAMUSCULAR | Status: AC
  Filled 2016-12-11: qty 2

## 2016-12-11 MED ORDER — ACETAMINOPHEN 650 MG RE SUPP: 650.0000 mg | Freq: Four times a day (QID) | RECTAL | Status: DC | PRN

## 2016-12-11 MED ORDER — BUPIVACAINE HCL 0.5 % IJ SOLN
INTRAMUSCULAR | Status: DC | PRN
  Administered 2016-12-11: 20 mL via INTRAMUSCULAR

## 2016-12-11 MED ORDER — OXYCODONE-ACETAMINOPHEN 5-325 MG PO TABS
1.0000 | ORAL_TABLET | ORAL | Status: DC | PRN
  Administered 2016-12-11 – 2016-12-12 (×4): 1 via ORAL
  Administered 2016-12-13: 2 via ORAL
  Filled 2016-12-11 (×4): qty 1
  Filled 2016-12-11: qty 2
  Filled 2016-12-11: qty 1

## 2016-12-11 MED ORDER — PROPOFOL 10 MG/ML IV BOLUS
INTRAVENOUS | Status: DC | PRN
  Administered 2016-12-11: 50 mg via INTRAVENOUS
  Administered 2016-12-11: 160 mg via INTRAVENOUS

## 2016-12-11 MED ORDER — CEFAZOLIN SODIUM-DEXTROSE 1-4 GM/50ML-% IV SOLN
INTRAVENOUS | Status: DC | PRN
  Administered 2016-12-11: 2 g via INTRAVENOUS

## 2016-12-11 MED ORDER — MIDAZOLAM HCL 2 MG/2ML IJ SOLN
INTRAMUSCULAR | Status: AC
  Filled 2016-12-11: qty 2

## 2016-12-11 MED ORDER — PIPERACILLIN-TAZOBACTAM 3.375 G IVPB
3.3750 g | Freq: Three times a day (TID) | INTRAVENOUS | Status: DC
  Administered 2016-12-11 – 2016-12-13 (×6): 3.375 g via INTRAVENOUS
  Filled 2016-12-11 (×6): qty 50

## 2016-12-11 MED ORDER — ROCURONIUM BROMIDE 100 MG/10ML IV SOLN
INTRAVENOUS | Status: DC | PRN
  Administered 2016-12-11: 40 mg via INTRAVENOUS
  Administered 2016-12-11: 10 mg via INTRAVENOUS
  Administered 2016-12-11: 5 mg via INTRAVENOUS

## 2016-12-11 MED ORDER — LIDOCAINE HCL (CARDIAC) 20 MG/ML IV SOLN
INTRAVENOUS | Status: DC | PRN
  Administered 2016-12-11: 100 mg via INTRAVENOUS

## 2016-12-11 MED ORDER — CHLORHEXIDINE GLUCONATE CLOTH 2 % EX PADS
6.0000 | MEDICATED_PAD | Freq: Every day | CUTANEOUS | Status: DC
  Administered 2016-12-11 – 2016-12-13 (×3): 6 via TOPICAL

## 2016-12-11 MED ORDER — ONDANSETRON 4 MG PO TBDP: 4.0000 mg | ORAL_TABLET | Freq: Four times a day (QID) | ORAL | Status: DC | PRN

## 2016-12-11 MED ORDER — CHLORHEXIDINE GLUCONATE CLOTH 2 % EX PADS: 6.0000 | MEDICATED_PAD | Freq: Once | CUTANEOUS | Status: DC

## 2016-12-11 MED ORDER — PHENYLEPHRINE HCL 10 MG/ML IJ SOLN
INTRAMUSCULAR | Status: DC | PRN
  Administered 2016-12-11: 200 ug via INTRAVENOUS
  Administered 2016-12-11: 100 ug via INTRAVENOUS

## 2016-12-11 MED ORDER — SUCCINYLCHOLINE CHLORIDE 20 MG/ML IJ SOLN
INTRAMUSCULAR | Status: DC | PRN
  Administered 2016-12-11: 100 mg via INTRAVENOUS

## 2016-12-11 MED ORDER — ONDANSETRON HCL 4 MG/2ML IJ SOLN
INTRAMUSCULAR | Status: DC | PRN
Start: 2016-12-11 — End: 2016-12-11
  Administered 2016-12-11: 4 mg via INTRAVENOUS

## 2016-12-11 MED ORDER — SUGAMMADEX SODIUM 500 MG/5ML IV SOLN
INTRAVENOUS | Status: DC | PRN
  Administered 2016-12-11: 220 mg via INTRAVENOUS

## 2016-12-11 SURGICAL SUPPLY — 43 items
ADHESIVE MASTISOL STRL (MISCELLANEOUS) ×3 IMPLANT
APPLIER CLIP ROT 10 11.4 M/L (STAPLE) ×3
BLADE SURG SZ11 CARB STEEL (BLADE) ×3 IMPLANT
CANISTER SUCT 1200ML W/VALVE (MISCELLANEOUS) ×3 IMPLANT
CATH CHOLANG 76X19 KUMAR (CATHETERS) ×3 IMPLANT
CHLORAPREP W/TINT 26ML (MISCELLANEOUS) ×3 IMPLANT
CLIP APPLIE ROT 10 11.4 M/L (STAPLE) ×1 IMPLANT
CLOSURE WOUND 1/2 X4 (GAUZE/BANDAGES/DRESSINGS)
CONRAY 60ML FOR OR (MISCELLANEOUS) IMPLANT
DECANTER SPIKE VIAL GLASS SM (MISCELLANEOUS) ×6 IMPLANT
DRAPE SHEET LG 3/4 BI-LAMINATE (DRAPES) ×3 IMPLANT
DRSG TEGADERM 2-3/8X2-3/4 SM (GAUZE/BANDAGES/DRESSINGS) ×12 IMPLANT
DRSG TELFA 4X3 1S NADH ST (GAUZE/BANDAGES/DRESSINGS) ×3 IMPLANT
ELECT REM PT RETURN 9FT ADLT (ELECTROSURGICAL) ×3
ELECTRODE REM PT RTRN 9FT ADLT (ELECTROSURGICAL) ×1 IMPLANT
GLOVE BIO SURGEON STRL SZ7.5 (GLOVE) ×3 IMPLANT
GLOVE INDICATOR 8.0 STRL GRN (GLOVE) ×3 IMPLANT
GOWN STRL REUS W/ TWL LRG LVL3 (GOWN DISPOSABLE) ×3 IMPLANT
GOWN STRL REUS W/TWL LRG LVL3 (GOWN DISPOSABLE) ×6
GRASPER SUT TROCAR 14GX15 (MISCELLANEOUS) IMPLANT
IRRIGATION STRYKERFLOW (MISCELLANEOUS) ×1 IMPLANT
IRRIGATOR STRYKERFLOW (MISCELLANEOUS) ×3
IV NS 1000ML (IV SOLUTION) ×2
IV NS 1000ML BAXH (IV SOLUTION) ×1 IMPLANT
L-HOOK LAP DISP 36CM (ELECTROSURGICAL) ×3
LABEL OR SOLS (LABEL) ×3 IMPLANT
LHOOK LAP DISP 36CM (ELECTROSURGICAL) ×1 IMPLANT
NEEDLE HYPO 25X1 1.5 SAFETY (NEEDLE) ×3 IMPLANT
NEEDLE VERESS 14GA 120MM (NEEDLE) ×3 IMPLANT
NS IRRIG 500ML POUR BTL (IV SOLUTION) ×3 IMPLANT
PACK LAP CHOLECYSTECTOMY (MISCELLANEOUS) ×3 IMPLANT
PENCIL ELECTRO HAND CTR (MISCELLANEOUS) ×3 IMPLANT
POUCH ENDO CATCH 10MM SPEC (MISCELLANEOUS) ×3 IMPLANT
SCISSORS METZENBAUM CVD 33 (INSTRUMENTS) ×3 IMPLANT
SLEEVE ENDOPATH XCEL 5M (ENDOMECHANICALS) ×6 IMPLANT
STRIP CLOSURE SKIN 1/2X4 (GAUZE/BANDAGES/DRESSINGS) IMPLANT
SUT MNCRL 4-0 (SUTURE) ×2
SUT MNCRL 4-0 27XMFL (SUTURE) ×1
SUT VICRYL 0 AB UR-6 (SUTURE) IMPLANT
SUTURE MNCRL 4-0 27XMF (SUTURE) ×1 IMPLANT
TROCAR XCEL 12X100 BLDLESS (ENDOMECHANICALS) ×3 IMPLANT
TROCAR XCEL NON-BLD 5MMX100MML (ENDOMECHANICALS) ×3 IMPLANT
TUBING INSUFFLATOR HI FLOW (MISCELLANEOUS) ×3 IMPLANT

## 2016-12-11 NOTE — Progress Notes (Signed)
[facility] - [place] [facility]   PATIENT NAME: Richard Hunt    MR#:  [id number]  DATE OF BIRTH:  [birth year]  SUBJECTIVE:  CHIEF COMPLAINT:   Chief Complaint  Patient presents with  . Abdominal Pain   The patient has mild abdominal pain, status post laparoscopy cholecystectomy REVIEW OF SYSTEMS:  Review of Systems  Constitutional: Negative for chills, fever and malaise/fatigue.  HENT: Negative for sore throat.   Eyes: Negative for blurred vision and double vision.  Respiratory: Negative for cough, hemoptysis, shortness of breath, wheezing and stridor.   Cardiovascular: Negative for chest pain, palpitations, orthopnea and leg swelling.  Gastrointestinal: Positive for abdominal pain. Negative for blood in stool, diarrhea, melena, nausea and vomiting.  Genitourinary: Negative for dysuria, flank pain and hematuria.  Musculoskeletal: Negative for back pain and joint pain.  Neurological: Negative for dizziness, sensory change, focal weakness, seizures, loss of consciousness, weakness and headaches.  Endo/Heme/Allergies: Negative for polydipsia.  Psychiatric/Behavioral: Negative for depression. The patient is not nervous/anxious.     DRUG ALLERGIES:  No Known Allergies VITALS:  Blood pressure (!) 144/83, pulse 100, temperature 97.9 F (36.6 C), resp. rate 20, height  (1.651 m), weight 243 lb (110.2 kg), SpO2 97 %. PHYSICAL EXAMINATION:  Physical Exam  Constitutional: He is oriented to person, place, and time.  Morbid obesity  HENT:  Head: Normocephalic.  Mouth/Throat: Oropharynx is clear and moist.  Eyes: Conjunctivae and EOM are normal. No scleral icterus.  Neck: Normal range of motion. Neck supple. No JVD present. No tracheal deviation present.  Cardiovascular: Normal rate, regular rhythm and normal heart sounds.  Exam reveals no gallop.   No murmur heard. Pulmonary/Chest: Effort normal and breath sounds normal. No respiratory distress.  He has no wheezes. He has no rales.  Abdominal: Soft. He exhibits no distension. There is tenderness. There is no rebound.  no bowel sounds.  Musculoskeletal: Normal range of motion. He exhibits no edema or tenderness.  Neurological: He is alert and oriented to person, place, and time. No cranial nerve deficit.  Skin: No rash noted. No erythema.  Psychiatric: Affect normal.   LABORATORY PANEL:  Male CBC  Recent Labs Lab 12/11/16 0303  WBC 22.2*  HGB 13.6  HCT 38.0*  PLT 182   ------------------------------------------------------------------------------------------------------------------ Chemistries   Recent Labs Lab 12/11/16 0303  NA 132*  K 4.3  CL 99*  CO2 25  GLUCOSE 346*  BUN 20  CREATININE 1.08  CALCIUM 8.4*  AST 30  ALT 29  ALKPHOS 79  BILITOT 1.8*   RADIOLOGY:  Dg Chest 2 View  Result Date: 12/11/2016 CLINICAL DATA:  56 year old male with a history of cholelithiasis. Preoperative chest x-ray EXAM: CHEST  2 VIEW COMPARISON:  None. FINDINGS: Cardiomediastinal silhouette within normal limits. No evidence of central vascular congestion. No pneumothorax or pleural effusion. Low lung volumes. No confluent airspace disease. Likely atelectasis. No displaced fracture IMPRESSION: Low lung volumes without evidence of acute cardiopulmonary disease Electronically Signed   By: Gilmer Mor D.O.   On: 12/11/2016 08:56   Ct Abdomen Pelvis W Contrast  Result Date: 12/10/2016 CLINICAL DATA:  Abdominal pain with nausea and vomiting which began at 6 p.m. tonight. EXAM: CT ABDOMEN AND PELVIS WITH CONTRAST TECHNIQUE: Multidetector CT imaging of the abdomen and pelvis was performed using the standard protocol following bolus administration of intravenous contrast. CONTRAST:  100 ml ISOVUE-300 IOPAMIDOL (ISOVUE-300) INJECTION 61% COMPARISON:  Right upper quadrant ultrasound this same day. FINDINGS: Lower chest:  Mild dependent atelectasis is seen lung bases. No pleural or pericardial  effusion. Heart size is normal. Hepatobiliary: Fatty infiltration of the liver is identified. No focal lesion. There is stranding about the gallbladder. No stones are identified. Pancreas: Unremarkable. No pancreatic ductal dilatation or surrounding inflammatory changes. Spleen: Normal in size without focal abnormality. Adrenals/Urinary Tract: 2 small left renal cysts are identified. There is a punctate low attenuating lesion the right kidney which is also likely a cyst. The kidneys are otherwise unremarkable. Ureters and urinary bladder appear normal. The adrenal glands are normal appearance P Stomach/Bowel: Stomach is within normal limits. Appendix appears normal. No evidence of bowel wall thickening, distention, or inflammatory changes. Vascular/Lymphatic: Aortic atherosclerosis. No enlarged abdominal or pelvic lymph nodes. Reproductive: The prostate gland is mildly prominent. Other: No ascites. Very small fat containing umbilical hernia noted. Musculoskeletal: No lytic or sclerotic bony lesion. No acute bony abnormality. IMPRESSION: Stranding about the gallbladder worrisome for inflammatory change. Note is made the patient's right upper quadrant ultrasound showed only some gallbladder sludge. Fatty infiltration of the liver. Atherosclerosis. Mild prostatomegaly. Very small fat containing umbilical hernia. Electronically Signed   By: Drusilla Kannerhomas  Dalessio M.D.   On: 12/10/2016 23:27   ASSESSMENT AND PLAN:    Acute cholecystitis with leukocytosis. Status post laparoscopy cholecystectomy today.  Continue the antibiotics. Pain control. Follow-up CBC.    Diabetes (HCC)  BS>200. On sliding scale insulin with corresponding glucose checks, add lantus HS.    HTN (hypertension). Controlled without hypertension medication.Marland Kitchen.    HLD (hyperlipidemia) Unremarkable lipid panel.  Morbid obesity.  All the records are reviewed and case discussed with Care Management/Social Worker. Management plans discussed with the  patient, family and they are in agreement.  CODE STATUS: Full Code  TOTAL TIME TAKING CARE OF THIS PATIENT: 32 minutes.   More than 50% of the time was spent in counseling/coordination of care: YES  POSSIBLE D/C IN 1-2 DAYS, DEPENDING ON CLINICAL CONDITION.   Shaune Pollackhen, Kalesha Irving M.D on 12/11/2016 at 3:52 PM  Between 7am to 6pm - Pager - [phone]  After 6pm go to [email or web address] - Therapist, nutritionalpassword [email or web address]  [facility]

## 2016-12-11 NOTE — H&P (Addendum)
SURGICAL ADMISSION HISTORY & PHYSICAL  HISTORY OF PRESENT ILLNESS (HPI):  The history below was obtained with the assistance of a certified medical Spanish-English interpreter.  56 y.o. obese male returned to [facility] ED tonight for worsening abdominal pain after having presented and been discharged home with outpatient surgical follow-up for similar but lesser and transient abdominal pain last night with findings of biliary sludge without cholecystitis on RUQ abdominal ultrasound. Patient reports the pain in his upper abdomen > RUQ first began yesterday ~4 pm after he ate beef and cheese tortillas. Though patient reports daily moderately severe heartburn, for which he takes only Tums, this pain he describes was different and worse than his heartburn and was associated with nausea without emesis. After returning home, the patient says he ate only a peach, after which his pain returned and worsened until he returned to the ED, since which the pain has improved, though not resolved.   Patient otherwise denies any prior similar episodes and denies fever/chills, CP, or SOB at rest. Though he says he can walk several blocks and up/down a flight of steps without CP or SOB, he frequently gets SOB with exertion working at his [employer] job. Of note, patient states he has a PMD that he last saw 1 year ago, at which time he was prescribed medications for DM, HTN, and high cholesterol, but patient has not since taken any of the medications prescribed, stating he did not like the way the medications made him feel. He was also told he knees B/L knee replacements.  Due to patient's uncontrolled GERD with epigastric pain, worsened leukocytosis, and a RUQ ultrasound last night demonstrating only biliary sludge without cholecystitis, CT abdominal imaging was advised and performed.  Surgery is consulted by ED physician Dr. York Cerise in this context for evaluation and management of acute cholecystitis.  PAST  MEDICAL HISTORY (PMH):      Past Medical History:  Diagnosis Date  . Acute kidney injury (nontraumatic) (HCC)   . Diabetes mellitus without complication (HCC)   . GERD (gastroesophageal reflux disease)   . HLD (hyperlipidemia)   . Hypercholesterolemia   . Hypertension   . Osteoarthritis   . Polycystic kidney disease     PAST SURGICAL HISTORY (PSH):  No past surgical history on file.   MEDICATIONS:         Prior to Admission medications   Medication Sig Start Date End Date Taking? Authorizing Provider  ondansetron (ZOFRAN ODT) 4 MG disintegrating tablet Take 1 tablet (4 mg total) by mouth every 8 (eight) hours as needed for nausea or vomiting. 12/10/16  Yes Irean Hong, MD  traMADol (ULTRAM) 50 MG tablet Take 1 tablet (50 mg total) by mouth every 6 (six) hours as needed. 12/10/16  Yes Irean Hong, MD     ALLERGIES:  No Known Allergies   SOCIAL HISTORY:  Social History   Social History  . Marital status: Married    Spouse name: N/A  . Number of children: N/A  . Years of education: N/A      Occupational History  . Not on file.       Social History Main Topics  . Smoking status: Never Smoker  . Smokeless tobacco: Never Used  . Alcohol use No  . Drug use: Unknown  . Sexual activity: Not on file       Other Topics Concern  . Not on file      Social History Narrative  . No narrative on file  The patient currently resides (home / rehab facility / nursing home): Home The patient normally is (ambulatory / bedbound): Ambulatory   FAMILY HISTORY:  No family history on file.   REVIEW OF SYSTEMS:  Constitutional: denies weight loss, fever, chills, or sweats  Eyes: denies any other vision changes, history of eye injury  ENT: denies sore throat, hearing problems  Respiratory: denies shortness of breath, wheezing  Cardiovascular: denies chest pain, palpitations  Gastrointestinal: abdominal pain, N/V, and bowel function as per  HPI Genitourinary: denies burning with urination or urinary frequency Musculoskeletal: denies any other joint pains or cramps  Skin: denies any other rashes or skin discolorations  Neurological: denies any other headache, dizziness, weakness  Psychiatric: denies any other depression, anxiety   All other review of systems were negative   VITAL SIGNS:  Temp:  [99.8 F (37.7 C)] 99.8 F (37.7 C) (09/09 1745) Pulse Rate:  [88-117] 88 (09/10 0000) Resp:  [15-20] 15 (09/10 0000) BP: (110-192)/(58-104) 110/68 (09/10 0000) SpO2:  [90 %-97 %] 95 % (09/10 0000) Weight:  [243 lb (110.2 kg)] 243 lb (110.2 kg) (09/09 1747)     Height: 5\' 5"  (165.1 cm) Weight: 243 lb (110.2 kg) BMI (Calculated): 40.44   INTAKE/OUTPUT:  This shift: No intake/output data recorded.  Last 2 shifts: @IOLAST2SHIFTS @   PHYSICAL EXAM:  Constitutional:  -- Obese body habitus  -- Awake, alert, and oriented x3  Eyes:  -- Pupils equally round and reactive to light  -- No scleral icterus  Ear, nose, and throat:  -- No jugular venous distension  Pulmonary:  -- No crackles  -- Equal breath sounds bilaterally -- Breathing non-labored at rest Cardiovascular:  -- S1, S2 present  -- No pericardial rubs Gastrointestinal:  -- Abdomen soft and obese, but non-distended with RUQ > epigastric tenderness to palpation, no guarding or rebound tenderness -- No abdominal masses appreciated, pulsatile or otherwise  Musculoskeletal and Integumentary:  -- Wounds or skin discoloration: None appreciated -- Extremities: B/L UE and LE FROM, hands and feet warm, no edema  Neurologic:  -- Motor function: intact and symmetric -- Sensation: intact and symmetric  Labs:  CBC Latest Ref Rng & Units 12/10/2016 12/10/2016  WBC 3.8 - 10.6 K/uL 23.9(H) 15.3(H)  Hemoglobin 13.0 - 18.0 g/dL 40.914.5 81.115.9  Hematocrit 91.440.0 - 52.0 % 41.1 45.5  Platelets 150 - 440 K/uL 210 249   CMP Latest Ref Rng & Units 12/10/2016 12/10/2016  Glucose 65 - 99 mg/dL  782(N336(H) 562(Z385(H)  BUN 6 - 20 mg/dL 20 30(Q43(H)  Creatinine 6.570.61 - 1.24 mg/dL 8.460.97 9.62(X1.59(H)  Sodium 528135 - 145 mmol/L 131(L) 133(L)  Potassium 3.5 - 5.1 mmol/L 4.2 4.5  Chloride 101 - 111 mmol/L 98(L) 101  CO2 22 - 32 mmol/L 25 22  Calcium 8.9 - 10.3 mg/dL 8.9 9.3  Total Protein 6.5 - 8.1 g/dL 7.6 4.1(L8.7(H)  Total Bilirubin 0.3 - 1.2 mg/dL 2.4(M1.4(H) 0.7  Alkaline Phos 38 - 126 U/L 86 121  AST 15 - 41 U/L 32 19  ALT 17 - 63 U/L 29 20   Imaging studies:  CT Abdomen and Pelvis with Contrast (12/10/2016) - personally reviewed and discussed with patient Hepatobiliary: Fatty infiltration of the liver is identified. No focal lesion. There is stranding about the gallbladder, worrisome  for inflammatory change. No stones are identified.  Stomach/Bowel: Stomach is within normal limits. Appendix  Appears normal. No evidence of bowel wall thickening, distention,  or inflammatory changes.  Pancreas: Unremarkable. No pancreatic ductal dilatation  or surrounding inflammatory changes.  Adrenals/Urinary Tract: 2 small left renal cysts are identified. There is a punctate low attenuating lesion the right kidney which is also likely a cyst. The kidneys are otherwise unremarkable. Ureters and urinary bladder appear normal. The adrenal glands are normal.  Limited RUQ Abdominal Ultrasound (12/10/2016) - personally reviewed and discussed with patient Gallbladder is physiologically distended. Layering sludge without  shadowing stone. No gallbladder wall thickening or pericholecystic  fluid. No sonographic Murphy sign noted by sonographer.  Common bile duct diameter measures 6 mm.  Assessment/Plan: (ICD-10's: K81.0) 56 y.o. male with acute cholecystitis attributable to biliary sludge (microcholelithiasis), complicated by leukocytosis, likely reactive hyperbilirubinemia, and resolved AKI, as well as by pertinent comorbidities including morbid obesity (BMI >40), polycystic kidney disease, osteoarthritis, and  uncontrolled DM, HTN, hypercholesterolemia, and GERD.              - NPO, IVF              - pain control prn, IV antibiotics             - repeat morning WBC (CBC) and bilirubin (CMP)             - all risks, benefits, and alternatives to cholecystectomy and subsequent recovery were discussed with patient and his wife (bedside), including that it will likely be Dr. Tonita Cong performing the surgery, all of their questions were answered to their expressed satisfaction, patient expresses he wishes to proceed, and informed consent was obtained.             - medical consultation for pre-op risk stratification and optimization and management of comorbidities             - anticipate laparoscopic cholecystectomy prior to discharge             - DVT prophylaxis  All of the above findings and recommendations were discussed with the patient and his wife (bedside), and all of patient's and his family's questions were answered to their expressed satisfaction.  -- Scherrie Gerlach Earlene Plater, MD, RPVI [place]: [facility] General Surgery - Partnering for exceptional care. Office: [phone]

## 2016-12-11 NOTE — Progress Notes (Signed)
  Patient admitted overnight with likely acute cholecystitis. Appreciate internal medicine assistance.  I discussed the procedure in detail. We discussed the risks and benefits of a laparoscopic cholecystectomy and possible cholangiogram including, but not limited to bleeding, infection, injury to surrounding structures such as the intestine or liver, bile leak, retained gallstones, need to convert to an open procedure, prolonged diarrhea, blood clots such as  DVT, common bile duct injury, anesthesia risks, and possible need for additional procedures.  The likelihood of improvement in symptoms and return to the patient's normal status is good. We discussed the typical post-operative recovery course.  Discussed with the patient, given his mildly elevated troponin this morning that it would be repeated. Depending on the results of that lab will determine whether or not we proceed with surgery or a percutaneous cholecystostomy.  Patient voiced understanding.   Ricarda Frameharles Zaydn Gutridge, MD [facility] General Surgeon [facility]  Day [facility] [phone](7a-7p) [phone] Night [facility] [phone](7p-7a) [phone]

## 2016-12-11 NOTE — Consult Note (Addendum)
SURGICAL CONSULTATION NOTE (initial) - cpt: 99244  HISTORY OF PRESENT ILLNESS (HPI):  The history below was obtained with the assistance of a certified medical Spanish-English interpreter.  56 y.o. obese male returned to [facility] ED tonight for worsening abdominal pain after having presented and been discharged home with outpatient surgical follow-up for similar but lesser and transient abdominal pain last night with findings of biliary sludge without cholecystitis on RUQ abdominal ultrasound. Patient reports the pain in his upper abdomen > RUQ first began yesterday ~4 pm after he ate beef and cheese tortillas. Though patient reports daily moderately severe heartburn, for which he takes only Tums, this pain he describes was different and worse than his heartburn and was associated with nausea without emesis. After returning home, the patient says he ate only a peach, after which his pain returned and worsened until he returned to the ED, since which the pain has improved, though not resolved.   Patient otherwise denies any prior similar episodes and denies fever/chills, CP, or SOB at rest. Though he says he can walk several blocks and up/down a flight of steps without CP or SOB, he frequently gets SOB with exertion working at his [employer] job. Of note, patient states he has a PMD that he last saw 1 year ago, at which time he was prescribed medications for DM, HTN, and high cholesterol, but patient has not since taken any of the medications prescribed, stating he did not like the way the medications made him feel. He was also told he knees B/L knee replacements.  Due to patient's uncontrolled GERD with epigastric pain, worsened leukocytosis, and a RUQ ultrasound last night demonstrating only biliary sludge without cholecystitis, CT abdominal imaging was advised and performed.  Surgery is consulted by ED physician Dr. York Cerise in this context for evaluation and management of acute cholecystitis.  PAST  MEDICAL HISTORY (PMH):  Past Medical History:  Diagnosis Date  . Acute kidney injury (nontraumatic) (HCC)   . Diabetes mellitus without complication (HCC)   . GERD (gastroesophageal reflux disease)   . HLD (hyperlipidemia)   . Hypercholesterolemia   . Hypertension   . Osteoarthritis   . Polycystic kidney disease     PAST SURGICAL HISTORY (PSH):  No past surgical history on file.   MEDICATIONS:  Prior to Admission medications   Medication Sig Start Date End Date Taking? Authorizing Provider  ondansetron (ZOFRAN ODT) 4 MG disintegrating tablet Take 1 tablet (4 mg total) by mouth every 8 (eight) hours as needed for nausea or vomiting. 12/10/16  Yes Irean Hong, MD  traMADol (ULTRAM) 50 MG tablet Take 1 tablet (50 mg total) by mouth every 6 (six) hours as needed. 12/10/16  Yes Irean Hong, MD     ALLERGIES:  No Known Allergies   SOCIAL HISTORY:  Social History   Social History  . Marital status: Married    Spouse name: N/A  . Number of children: N/A  . Years of education: N/A   Occupational History  . Not on file.   Social History Main Topics  . Smoking status: Never Smoker  . Smokeless tobacco: Never Used  . Alcohol use No  . Drug use: Unknown  . Sexual activity: Not on file   Other Topics Concern  . Not on file   Social History Narrative  . No narrative on file    The patient currently resides (home / rehab facility / nursing home): Home The patient normally is (ambulatory / bedbound): Ambulatory  FAMILY HISTORY:  No family history on file.   REVIEW OF SYSTEMS:  Constitutional: denies weight loss, fever, chills, or sweats  Eyes: denies any other vision changes, history of eye injury  ENT: denies sore throat, hearing problems  Respiratory: denies shortness of breath, wheezing  Cardiovascular: denies chest pain, palpitations  Gastrointestinal: abdominal pain, N/V, and bowel function as per HPI Genitourinary: denies burning with urination or urinary  frequency Musculoskeletal: denies any other joint pains or cramps  Skin: denies any other rashes or skin discolorations  Neurological: denies any other headache, dizziness, weakness  Psychiatric: denies any other depression, anxiety   All other review of systems were negative   VITAL SIGNS:  Temp:  [99.8 F (37.7 C)] 99.8 F (37.7 C) (09/09 1745) Pulse Rate:  [88-117] 88 (09/10 0000) Resp:  [15-20] 15 (09/10 0000) BP: (110-192)/(58-104) 110/68 (09/10 0000) SpO2:  [90 %-97 %] 95 % (09/10 0000) Weight:  [243 lb (110.2 kg)] 243 lb (110.2 kg) (09/09 1747)     Height: 5\' 5"  (165.1 cm) Weight: 243 lb (110.2 kg) BMI (Calculated): 40.44   INTAKE/OUTPUT:  This shift: No intake/output data recorded.  Last 2 shifts: @IOLAST2SHIFTS @   PHYSICAL EXAM:  Constitutional:  -- Obese body habitus  -- Awake, alert, and oriented x3  Eyes:  -- Pupils equally round and reactive to light  -- No scleral icterus  Ear, nose, and throat:  -- No jugular venous distension  Pulmonary:  -- No crackles  -- Equal breath sounds bilaterally -- Breathing non-labored at rest Cardiovascular:  -- S1, S2 present  -- No pericardial rubs Gastrointestinal:  -- Abdomen soft and obese, but non-distended with RUQ > epigastric tenderness to palpation, no guarding or rebound tenderness -- No abdominal masses appreciated, pulsatile or otherwise  Musculoskeletal and Integumentary:  -- Wounds or skin discoloration: None appreciated -- Extremities: B/L UE and LE FROM, hands and feet warm, no edema  Neurologic:  -- Motor function: intact and symmetric -- Sensation: intact and symmetric  Labs:  CBC Latest Ref Rng & Units 12/10/2016 12/10/2016  WBC 3.8 - 10.6 K/uL 23.9(H) 15.3(H)  Hemoglobin 13.0 - 18.0 g/dL 16.114.5 09.615.9  Hematocrit 04.540.0 - 52.0 % 41.1 45.5  Platelets 150 - 440 K/uL 210 249   CMP Latest Ref Rng & Units 12/10/2016 12/10/2016  Glucose 65 - 99 mg/dL 409(W336(H) 119(J385(H)  BUN 6 - 20 mg/dL 20 47(W43(H)  Creatinine 2.950.61 - 1.24  mg/dL 6.210.97 3.08(M1.59(H)  Sodium 578135 - 145 mmol/L 131(L) 133(L)  Potassium 3.5 - 5.1 mmol/L 4.2 4.5  Chloride 101 - 111 mmol/L 98(L) 101  CO2 22 - 32 mmol/L 25 22  Calcium 8.9 - 10.3 mg/dL 8.9 9.3  Total Protein 6.5 - 8.1 g/dL 7.6 4.6(N8.7(H)  Total Bilirubin 0.3 - 1.2 mg/dL 6.2(X1.4(H) 0.7  Alkaline Phos 38 - 126 U/L 86 121  AST 15 - 41 U/L 32 19  ALT 17 - 63 U/L 29 20   Imaging studies:  CT Abdomen and Pelvis with Contrast (12/10/2016) - personally reviewed and discussed with patient Hepatobiliary: Fatty infiltration of the liver is identified. No focal lesion. There is stranding about the gallbladder, worrisome  for inflammatory change. No stones are identified.  Stomach/Bowel: Stomach is within normal limits. Appendix  Appears normal. No evidence of bowel wall thickening, distention,  or inflammatory changes.  Pancreas: Unremarkable. No pancreatic ductal dilatation or surrounding inflammatory changes.  Adrenals/Urinary Tract: 2 small left renal cysts are identified. There is a punctate low attenuating lesion the  right kidney which is also likely a cyst. The kidneys are otherwise unremarkable. Ureters and urinary bladder appear normal. The adrenal glands are normal.  Limited RUQ Abdominal Ultrasound (12/10/2016) - personally reviewed and discussed with patient Gallbladder is physiologically distended. Layering sludge without  shadowing stone. No gallbladder wall thickening or pericholecystic  fluid. No sonographic Murphy sign noted by sonographer.  Common bile duct diameter measures 6 mm.  Assessment/Plan: (ICD-10's: K81.0) 56 y.o. male with acute cholecystitis attributable to biliary sludge (microcholelithiasis), complicated by leukocytosis, likely reactive hyperbilirubinemia, and resolved AKI, as well as by pertinent comorbidities including morbid obesity (BMI >40), polycystic kidney disease, osteoarthritis, and uncontrolled DM, HTN, hypercholesterolemia, and GERD.   - NPO, IVF   - pain  control prn, IV antibiotics  - repeat morning WBC (CBC) and bilirubin (CMP)  - all risks, benefits, and alternatives to cholecystectomy and subsequent recovery were discussed with patient and his wife (bedside), including that it will likely be Dr. Tonita Cong performing the surgery, all of their questions were answered to their expressed satisfaction, patient expresses he wishes to proceed, and informed consent was obtained.  - medical consultation for pre-op risk stratification and optimization and management of comorbidities  - anticipate laparoscopic cholecystectomy prior to discharge  - DVT prophylaxis  All of the above findings and recommendations were discussed with the patient and his wife (bedside), and all of patient's and his family's questions were answered to their expressed satisfaction.  Thank you for the opportunity to participate in this patient's care.   -- Scherrie Gerlach Earlene Plater, MD, RPVI [place]: [facility] General Surgery - Partnering for exceptional care. Office: [phone]

## 2016-12-11 NOTE — Brief Op Note (Signed)
12/10/2016 - 12/11/2016  12:48 PM  PATIENT:  Richard Hunt  56 y.o. male  PRE-OPERATIVE DIAGNOSIS:  acute  cholecystitis  POST-OPERATIVE DIAGNOSIS:  acute  cholecystitis  PROCEDURE:  Procedure(s): LAPAROSCOPIC CHOLECYSTECTOMY (N/A)  SURGEON:  Surgeon(s) and Role:    * Ricarda FrameWoodham, Leeam Cedrone, MD - Primary  PHYSICIAN ASSISTANT:   ASSISTANTS: none   ANESTHESIA:   general  EBL:  Total I/O In: 616 [I.V.:616] Out: 850 [Urine:850]  BLOOD ADMINISTERED:none  DRAINS: none   LOCAL MEDICATIONS USED:  MARCAINE   , XYLOCAINE  and Amount: 20 ml  SPECIMEN:  Source of Specimen:  gallbladder  DISPOSITION OF SPECIMEN:  PATHOLOGY  COUNTS:  YES  TOURNIQUET:  * No tourniquets in log *  DICTATION: .Dragon Dictation  PLAN OF CARE: return to inpatient status  PATIENT DISPOSITION:  PACU - hemodynamically stable.   Delay start of Pharmacological VTE agent (>24hrs) due to surgical blood loss or risk of bleeding: no

## 2016-12-11 NOTE — Progress Notes (Signed)
Inpatient Diabetes Program Recommendations  AACE/ADA: New Consensus Statement on Inpatient Glycemic Control (2015)  Target Ranges:  Prepandial:   less than 140 mg/dL      Peak postprandial:   less than 180 mg/dL (1-2 hours)      Critically ill patients:  140 - 180 mg/dL   Lab Results  Component Value Date   GLUCAP 214 (H) 12/11/2016    Review of Glycemic ControlResults for Richard Hunt, Richard Hunt (MRN [id number]) as of 12/11/2016 13:32  Ref. Range 12/11/2016 04:27 12/11/2016 07:48 12/11/2016 10:23 12/11/2016 13:08  Glucose-Capillary Latest Ref Range: 65 - 99 mg/dL 409295 (H) 811297 (H) 914236 (H) 214 (H)   Diabetes history: Type 2 diabetes Outpatient Diabetes medications: None noted Current orders for Inpatient glycemic control:  Novolog resistant q 4 hours Inpatient Diabetes Program Recommendations:   Please consider checking A1C to determine pre-hospitalization glycemic control. If fasting blood sugars remain >180 mg/dL, consider adding Lantus 20 units daily.    Thanks, Beryl MeagerJenny Kaedon Fanelli, RN, BC-ADM Inpatient Diabetes Coordinator Pager [phone] (8a-5p)

## 2016-12-11 NOTE — Anesthesia Post-op Follow-up Note (Signed)
Anesthesia QCDR form completed.        

## 2016-12-11 NOTE — Transfer of Care (Signed)
Immediate Anesthesia Transfer of Care Note  Patient: Richard Hunt  Procedure(s) Performed: Procedure(s): LAPAROSCOPIC CHOLECYSTECTOMY (N/A)  Patient Location: PACU  Anesthesia Type:General  Level of Consciousness: sedated and responds to stimulation  Airway & Oxygen Therapy: Patient Spontanous Breathing and Patient connected to face mask oxygen  Post-op Assessment: Report given to RN and Post -op Vital signs reviewed and stable  Post vital signs: Reviewed and stable  Last Vitals:  Vitals:   12/11/16 1302 12/11/16 1305  BP: 121/71 121/71  Pulse: (!) 102 (!) 102  Resp: (!) 29 (!) 30  Temp: (!) 36.3 C   SpO2: 91% 91%    Last Pain:  Vitals:   12/11/16 1022  TempSrc: Tympanic  PainSc: 4          Complications: No apparent anesthesia complications

## 2016-12-11 NOTE — Consult Note (Signed)
[facility] [employer] at [facility]   PATIENT NAME: Richard Hunt    MR#:  [id number]  DATE OF BIRTH:  22-Sep-1960  DATE OF ADMISSION:  12/10/2016  PRIMARY CARE PHYSICIAN: [facility]   REQUESTING/REFERRING PHYSICIAN: Earlene Plateravis, MD  CHIEF COMPLAINT:   Chief Complaint  Patient presents with  . Abdominal Pain    HISTORY OF PRESENT ILLNESS:  Richard Hunt  is a 56 y.o. male who presents with Abdominal pain, noted to have cholecystitis with biliary sludge here in the ED. Surgery contacted and plans were cholecystectomy. Patient has significant comorbid disease with diabetes, hypertension, hyperlipidemia. Hospitalists called for consult for comanagement of medical conditions  PAST MEDICAL HISTORY:   Past Medical History:  Diagnosis Date  . Diabetes mellitus without complication (HCC)   . HLD (hyperlipidemia)   . Hypertension     PAST SURGICAL HISTOIRY:   Past Surgical History:  Procedure Laterality Date  . VEIN SURGERY      SOCIAL HISTORY:   Social History  Substance Use Topics  . Smoking status: Never Smoker  . Smokeless tobacco: Never Used  . Alcohol use No    FAMILY HISTORY:   Family History  Problem Relation Age of Onset  . Diabetes Mother     DRUG ALLERGIES:  No Known Allergies  REVIEW OF SYSTEMS:  Review of Systems  Constitutional: Positive for malaise/fatigue. Negative for chills, fever and weight loss.  HENT: Negative for ear pain, hearing loss and tinnitus.   Eyes: Negative for blurred vision, double vision, pain and redness.  Respiratory: Negative for cough, hemoptysis and shortness of breath.   Cardiovascular: Negative for chest pain, palpitations, orthopnea and leg swelling.  Gastrointestinal: Positive for abdominal pain and nausea. Negative for constipation, diarrhea and vomiting.  Genitourinary: Negative for dysuria, frequency and hematuria.  Musculoskeletal: Negative for back  pain, joint pain and neck pain.  Skin:       No acne, rash, or lesions  Neurological: Negative for dizziness, tremors, focal weakness and weakness.  Endo/Heme/Allergies: Negative for polydipsia. Does not bruise/bleed easily.  Psychiatric/Behavioral: Negative for depression. The patient is not nervous/anxious and does not have insomnia.     MEDICATIONS AT HOME:   Prior to Admission medications   Medication Sig Start Date End Date Taking? Authorizing Provider  ondansetron (ZOFRAN ODT) 4 MG disintegrating tablet Take 1 tablet (4 mg total) by mouth every 8 (eight) hours as needed for nausea or vomiting. 12/10/16  Yes Irean HongSung, Jade J, MD  traMADol (ULTRAM) 50 MG tablet Take 1 tablet (50 mg total) by mouth every 6 (six) hours as needed. 12/10/16  Yes Irean HongSung, Jade J, MD      VITAL SIGNS:   Vitals:   12/11/16 0000 12/11/16 0030 12/11/16 0100 12/11/16 0130  BP: 110/68 139/87 119/72 119/75  Pulse: 88 89 87 78  Resp: 15 20 14 16   Temp:      TempSrc:      SpO2: 95% 97% 96% 96%  Weight:      Height:       Wt Readings from Last 3 Encounters:  12/10/16 110.2 kg (243 lb)  12/10/16 108.9 kg (240 lb)    PHYSICAL EXAMINATION:  Physical Exam  Vitals reviewed. Constitutional: He is oriented to person, place, and time. He appears well-developed and well-nourished. No distress.  HENT:  Head: Normocephalic and atraumatic.  Mouth/Throat: Oropharynx is clear and moist.  Eyes: Pupils are equal, round, and reactive to light. Conjunctivae and  EOM are normal. No scleral icterus.  Neck: Normal range of motion. Neck supple. No JVD present. No thyromegaly present.  Cardiovascular: Normal rate, regular rhythm and intact distal pulses.  Exam reveals no gallop and no friction rub.   No murmur heard. Respiratory: Effort normal and breath sounds normal. No respiratory distress. He has no wheezes. He has no rales.  GI: Soft. Bowel sounds are normal. He exhibits no distension. There is tenderness.  Musculoskeletal:  Normal range of motion. He exhibits no edema.  No arthritis, no gout  Lymphadenopathy:    He has no cervical adenopathy.  Neurological: He is alert and oriented to person, place, and time. No cranial nerve deficit.  No dysarthria, no aphasia  Skin: Skin is warm and dry. No rash noted. No erythema.  Psychiatric: He has a normal mood and affect. His behavior is normal. Judgment and thought content normal.     LABORATORY PANEL:   CBC  Recent Labs Lab 12/10/16 2213  WBC 23.9*  HGB 14.5  HCT 41.1  PLT 210   ------------------------------------------------------------------------------------------------------------------  Chemistries   Recent Labs Lab 12/10/16 2213  NA 131*  K 4.2  CL 98*  CO2 25  GLUCOSE 336*  BUN 20  CREATININE 0.97  CALCIUM 8.9  AST 32  ALT 29  ALKPHOS 86  BILITOT 1.4*   ------------------------------------------------------------------------------------------------------------------  Cardiac Enzymes  Recent Labs Lab 12/10/16 0022  TROPONINI <0.03   ------------------------------------------------------------------------------------------------------------------  RADIOLOGY:  Ct Abdomen Pelvis W Contrast  Result Date: 12/10/2016 CLINICAL DATA:  Abdominal pain with nausea and vomiting which began at 6 p.m. tonight. EXAM: CT ABDOMEN AND PELVIS WITH CONTRAST TECHNIQUE: Multidetector CT imaging of the abdomen and pelvis was performed using the standard protocol following bolus administration of intravenous contrast. CONTRAST:  100 ml ISOVUE-300 IOPAMIDOL (ISOVUE-300) INJECTION 61% COMPARISON:  Right upper quadrant ultrasound this same day. FINDINGS: Lower chest: Mild dependent atelectasis is seen lung bases. No pleural or pericardial effusion. Heart size is normal. Hepatobiliary: Fatty infiltration of the liver is identified. No focal lesion. There is stranding about the gallbladder. No stones are identified. Pancreas: Unremarkable. No pancreatic ductal  dilatation or surrounding inflammatory changes. Spleen: Normal in size without focal abnormality. Adrenals/Urinary Tract: 2 small left renal cysts are identified. There is a punctate low attenuating lesion the right kidney which is also likely a cyst. The kidneys are otherwise unremarkable. Ureters and urinary bladder appear normal. The adrenal glands are normal appearance P Stomach/Bowel: Stomach is within normal limits. Appendix appears normal. No evidence of bowel wall thickening, distention, or inflammatory changes. Vascular/Lymphatic: Aortic atherosclerosis. No enlarged abdominal or pelvic lymph nodes. Reproductive: The prostate gland is mildly prominent. Other: No ascites. Very small fat containing umbilical hernia noted. Musculoskeletal: No lytic or sclerotic bony lesion. No acute bony abnormality. IMPRESSION: Stranding about the gallbladder worrisome for inflammatory change. Note is made the patient's right upper quadrant ultrasound showed only some gallbladder sludge. Fatty infiltration of the liver. Atherosclerosis. Mild prostatomegaly. Very small fat containing umbilical hernia. Electronically Signed   By: Drusilla Kanner M.D.   On: 12/10/2016 23:27   US Abdomen Limited Ruq  Result Date: 12/10/2016 CLINICAL DATA:  Epigastric pain.  Nausea and vomiting. EXAM: ULTRASOUND ABDOMEN LIMITED RIGHT UPPER QUADRANT COMPARISON:  None. FINDINGS: Gallbladder: Physiologically distended. Layering sludge without shadowing stone. No gallbladder wall thickening or pericholecystic fluid. No sonographic Murphy sign noted by sonographer. Common bile duct: Diameter: 6 mm. Liver: Increased heterogeneous hepatic parenchyma, difficult to penetrate. No evidence of gross focal lesion.  Portal vein is patent on color Doppler imaging with normal direction of blood flow towards the liver. IMPRESSION: 1. Gallbladder sludge without gallstones or gallbladder inflammation. No biliary dilatation. 2. Hepatic steatosis, liver parenchyma  is difficult to penetrate. Electronically Signed   By: Rubye Oaks M.D.   On: 12/10/2016 02:28    EKG:   Orders placed or performed during the hospital encounter of 12/10/16  . ED EKG  . ED EKG  . EKG 12-Lead  . EKG 12-Lead  . EKG 12-Lead  . EKG 12-Lead    IMPRESSION AND PLAN:  Principal Problem:   Acute cholecystitis - surgical intervention planned, defer to primary surgical team Active Problems:   Diabetes (HCC) - sliding scale insulin with corresponding glucose checks   HTN (hypertension) - currently stable, verify home meds and continue at home dose while here in the hospital   HLD (hyperlipidemia) - unclear if patient is currently taking any antilipid medication, check lipid panel  All the records are reviewed and case discussed with ED provider. Management plans discussed with the patient and/or family.  CODE STATUS: Full    Code Status Orders        Start     Ordered   12/11/16 0100  Full code  Continuous     12/11/16 0113    Code Status History    Date Active Date Inactive Code Status Order ID Comments User Context   This patient has a current code status but no historical code status.    Full Code  TOTAL TIME TAKING CARE OF THIS PATIENT: 40 minutes.    Anne Hahn, Karletta Millay FIELDING 12/11/2016, 1:47 AM  [employer]  [phone]  CC: Primary care Physician: [facility]  Note:  This document was prepared using Conservation officer, historic buildings and may include unintentional dictation errors.

## 2016-12-11 NOTE — Anesthesia Procedure Notes (Signed)
Procedure Name: Intubation Performed by: Casey BurkittHOANG, Genea Rheaume Pre-anesthesia Checklist: Patient identified, Patient being monitored, Timeout performed, Emergency Drugs available and Suction available Patient Re-evaluated:Patient Re-evaluated prior to induction Oxygen Delivery Method: Circle system utilized Preoxygenation: Pre-oxygenation with 100% oxygen Induction Type: IV induction Ventilation: Mask ventilation without difficulty and Oral airway inserted - appropriate to patient size Laryngoscope Size: McGraph and 4 Grade View: Grade II Tube type: Oral Tube size: 7.5 mm Number of attempts: 1 Airway Equipment and Method: Stylet Placement Confirmation: ETT inserted through vocal cords under direct vision,  positive ETCO2 and breath sounds checked- equal and bilateral Secured at: 22 cm Tube secured with: Tape Dental Injury: Teeth and Oropharynx as per pre-operative assessment  Future Recommendations: Recommend- induction with short-acting agent, and alternative techniques readily available

## 2016-12-11 NOTE — Op Note (Signed)
Laparoscopic Cholecystectomy  Pre-operative Diagnosis: Acute cholecystitis  Post-operative Diagnosis: Acute gangrenous cholecystitis  Procedure: Laparoscopic Cholecystectomy  Surgeon: Leonette Most T. Tonita Cong, MD FACS  Anesthesia: Gen. with endotracheal tube  Assistant: None  Procedure Details  The patient was seen again in the Holding Room. The benefits, complications, treatment options, and expected outcomes were discussed with the patient. The risks of bleeding, infection, recurrence of symptoms, failure to resolve symptoms, bile duct damage, bile duct leak, retained common bile duct stone, bowel injury, any of which could require further surgery and/or ERCP, stent, or papillotomy were reviewed with the patient. The likelihood of improving the patient's symptoms with return to their baseline status is good.  The patient and/or family concurred with the proposed plan, giving informed consent.  The patient was taken to Operating Room, identified as Visente Kirker and the procedure verified as Laparoscopic Cholecystectomy.  A Time Out was held and the above information confirmed.  Prior to the induction of general anesthesia, antibiotic prophylaxis was administered. VTE prophylaxis was in place. General endotracheal anesthesia was then administered and tolerated well. After the induction, the abdomen was prepped with Chloraprep and draped in the sterile fashion. The patient was positioned in the supine position.  Local anesthetic  was injected into the skin near the umbilicus and an incision made. The Veress needle was placed. Pneumoperitoneum was then created with CO2 and tolerated well without any adverse changes in the patient's vital signs. A 5mm port was placed in the periumbilical position and the abdominal cavity was explored.  Two 5-mm ports were placed in the right upper quadrant and a 12 mm epigastric port was placed all under direct vision. All skin incisions  were infiltrated with  a local anesthetic agent before making the incision and placing the trocars.   The patient was positioned  in reverse Trendelenburg, tilted slightly to the patient's left.  The gallbladder was identified and noted to be gangrenous and tensely distended. At this point the decision was made to decompress the gallbladder with the decompression needle and syringe. A total of 40 mL of bile was extracted which completely decompressed the fundus of the gallbladder. The fundus was grasped and retracted cephalad. Adhesions were lysed bluntly. The infundibulum was grasped and retracted laterally, exposing the peritoneum overlying the triangle of Calot. This was then divided and exposed in a blunt fashion. A critical view of the cystic duct and cystic artery was obtained.  The cystic duct was clearly identified and bluntly dissected.   The duct and artery were clearly identified. These were serially clipped with endoclips. 2 in the proximal stay and one distal go. They're cut in between endoscopic shears.  The gallbladder was taken from the gallbladder fossa in a retrograde fashion with the electrocautery. The gallbladder was removed and placed in an Endocatch bag. The liver bed was irrigated and inspected. Hemostasis was achieved with the electrocautery. Copious irrigation was utilized and was repeatedly aspirated until clear.  The gallbladder and Endocatch sac were then removed through the epigastric port site. The epigastric port site had to be bluntly and sharply enlarged in order for the gallbladder be removed.  Inspection of the right upper quadrant was performed. No bleeding, bile duct injury or leak, or bowel injury was noted. Pneumoperitoneum was released.  The epigastric port site was closed with figure-of-eight 0 Vicryl sutures. 4-0 subcuticular Monocryl was used to close the skin. Steristrips and Mastisol and sterile dressings were  applied.  The patient was then extubated and brought  to the recovery room  in stable condition. Sponge, lap, and needle counts were correct at closure and at the conclusion of the case.   Findings: Acute gangrenous Cholecystitis   Estimated Blood Loss: 20 mL         Drains: None         Specimens: Gallbladder           Complications: none               Kevonna Nolte T. Tonita CongWoodham, MD, FACS

## 2016-12-11 NOTE — Progress Notes (Signed)
Reported critical Troponin of 0.06 to Dr. Sheryle Hailiamond

## 2016-12-11 NOTE — Anesthesia Preprocedure Evaluation (Signed)
Anesthesia Evaluation  Patient identified by MRN, date of birth, ID band Patient awake    Reviewed: Allergy & Precautions, H&P , NPO status , Patient's Chart, lab work & pertinent test results  History of Anesthesia Complications Negative for: history of anesthetic complications  Airway Mallampati: III  TM Distance: <3 FB Neck ROM: full    Dental  (+) Poor Dentition, Chipped, Missing   Pulmonary neg pulmonary ROS, neg shortness of breath,           Cardiovascular Exercise Tolerance: Good hypertension, (-) angina(-) Past MI and (-) DOE      Neuro/Psych negative neurological ROS  negative psych ROS   GI/Hepatic Neg liver ROS, GERD  Medicated and Controlled,  Endo/Other  diabetes, Poorly Controlled, Type 2  Renal/GU Renal disease     Musculoskeletal  (+) Arthritis ,   Abdominal   Peds  Hematology negative hematology ROS (+)   Anesthesia Other Findings Past Medical History: No date: Acute kidney injury (nontraumatic) (HCC) No date: Diabetes mellitus without complication (HCC) No date: GERD (gastroesophageal reflux disease) No date: HLD (hyperlipidemia) No date: Hypercholesterolemia No date: Hypertension No date: Osteoarthritis No date: Polycystic kidney disease  Past Surgical History: No date: VEIN SURGERY  BMI    Body Mass Index:  40.44 kg/m      Reproductive/Obstetrics negative OB ROS                             Anesthesia Physical Anesthesia Plan  ASA: III  Anesthesia Plan: General ETT   Post-op Pain Management:    Induction: Intravenous  PONV Risk Score and Plan: 3 and Ondansetron, Dexamethasone, Midazolam and Treatment may vary due to age or medical condition  Airway Management Planned: Oral ETT and Video Laryngoscope Planned  Additional Equipment:   Intra-op Plan:   Post-operative Plan: Extubation in OR  Informed Consent: I have reviewed the patients History  and Physical, chart, labs and discussed the procedure including the risks, benefits and alternatives for the proposed anesthesia with the patient or authorized representative who has indicated his/her understanding and acceptance.   Dental Advisory Given  Plan Discussed with: Anesthesiologist, CRNA and Surgeon  Anesthesia Plan Comments: (Patient had 1 elevated cardiac enzyme which had resolved upon repeat.  No active cardiac symptoms endorsed by patient.  Patient consented for risks of anesthesia including but not limited to:  - adverse reactions to medications - damage to teeth, lips or other oral mucosa - sore throat or hoarseness - Damage to heart, brain, lungs or loss of life  Patient voiced understanding.)        Anesthesia Quick Evaluation

## 2016-12-11 NOTE — ED Notes (Signed)
Surgeon at bedside.  

## 2016-12-12 ENCOUNTER — Encounter: Payer: Self-pay | Admitting: General Surgery

## 2016-12-12 DIAGNOSIS — Z833 Family history of diabetes mellitus: Secondary | ICD-10-CM | POA: Diagnosis not present

## 2016-12-12 DIAGNOSIS — I10 Essential (primary) hypertension: Secondary | ICD-10-CM | POA: Diagnosis present

## 2016-12-12 DIAGNOSIS — K810 Acute cholecystitis: Secondary | ICD-10-CM | POA: Diagnosis present

## 2016-12-12 DIAGNOSIS — E7800 Pure hypercholesterolemia, unspecified: Secondary | ICD-10-CM | POA: Diagnosis present

## 2016-12-12 DIAGNOSIS — Z23 Encounter for immunization: Secondary | ICD-10-CM | POA: Diagnosis not present

## 2016-12-12 DIAGNOSIS — Z6841 Body Mass Index (BMI) 40.0 and over, adult: Secondary | ICD-10-CM | POA: Diagnosis not present

## 2016-12-12 DIAGNOSIS — K219 Gastro-esophageal reflux disease without esophagitis: Secondary | ICD-10-CM | POA: Diagnosis present

## 2016-12-12 DIAGNOSIS — E785 Hyperlipidemia, unspecified: Secondary | ICD-10-CM | POA: Diagnosis present

## 2016-12-12 DIAGNOSIS — E119 Type 2 diabetes mellitus without complications: Secondary | ICD-10-CM | POA: Diagnosis present

## 2016-12-12 LAB — COMPREHENSIVE METABOLIC PANEL
ALK PHOS: 80 U/L (ref 38–126)
ALT: 48 U/L (ref 17–63)
AST: 59 U/L — AB (ref 15–41)
Albumin: 2.8 g/dL — ABNORMAL LOW (ref 3.5–5.0)
Anion gap: 7 (ref 5–15)
BILIRUBIN TOTAL: 0.9 mg/dL (ref 0.3–1.2)
BUN: 16 mg/dL (ref 6–20)
CALCIUM: 8.2 mg/dL — AB (ref 8.9–10.3)
CHLORIDE: 103 mmol/L (ref 101–111)
CO2: 26 mmol/L (ref 22–32)
CREATININE: 0.91 mg/dL (ref 0.61–1.24)
GFR calc Af Amer: >60 mL/min (ref >60)
Glucose, Bld: 150 mg/dL — ABNORMAL HIGH (ref 65–99)
Potassium: 3.6 mmol/L (ref 3.5–5.1)
Sodium: 136 mmol/L (ref 135–145)
TOTAL PROTEIN: 6.5 g/dL (ref 6.5–8.1)

## 2016-12-12 LAB — HIV ANTIBODY (ROUTINE TESTING W REFLEX): HIV Screen 4th Generation wRfx: NONREACTIVE

## 2016-12-12 LAB — CBC
HEMATOCRIT: 35.5 % — AB (ref 40.0–52.0)
HEMOGLOBIN: 12.3 g/dL — AB (ref 13.0–18.0)
MCH: 31.2 pg (ref 26.0–34.0)
MCHC: 34.7 g/dL (ref 32.0–36.0)
MCV: 89.9 fL (ref 80.0–100.0)
Platelets: 150 10*3/uL (ref 150–440)
RBC: 3.95 MIL/uL — ABNORMAL LOW (ref 4.40–5.90)
RDW: 12.9 % (ref 11.5–14.5)
WBC: 17.1 10*3/uL — AB (ref 3.8–10.6)

## 2016-12-12 LAB — GLUCOSE, CAPILLARY
GLUCOSE-CAPILLARY: 232 mg/dL — AB (ref 65–99)
Glucose-Capillary: 141 mg/dL — ABNORMAL HIGH (ref 65–99)
Glucose-Capillary: 148 mg/dL — ABNORMAL HIGH (ref 65–99)
Glucose-Capillary: 163 mg/dL — ABNORMAL HIGH (ref 65–99)
Glucose-Capillary: 215 mg/dL — ABNORMAL HIGH (ref 65–99)
Glucose-Capillary: 236 mg/dL — ABNORMAL HIGH (ref 65–99)

## 2016-12-12 LAB — HEMOGLOBIN A1C
Hgb A1c MFr Bld: 10.4 % — ABNORMAL HIGH (ref 4.8–5.6)
Mean Plasma Glucose: 252 mg/dL

## 2016-12-12 LAB — SURGICAL PATHOLOGY

## 2016-12-12 MED ORDER — LIVING WELL WITH DIABETES BOOK - IN SPANISH
Freq: Once | Status: AC
  Administered 2016-12-12: 13:00:00
  Filled 2016-12-12: qty 1

## 2016-12-12 MED ORDER — BISACODYL 5 MG PO TBEC
5.0000 mg | DELAYED_RELEASE_TABLET | Freq: Every day | ORAL | Status: DC | PRN
  Administered 2016-12-12: 5 mg via ORAL
  Filled 2016-12-12: qty 1

## 2016-12-12 NOTE — Progress Notes (Signed)
Nutrition Education Note   RD consulted for nutrition education regarding diabetes.   Lab Results  Component Value Date   HGBA1C 10.4 (H) 12/11/2016    RD provided "Nutrition and Type II Diabetes" handout in Spanish from the Academy of [employer]etics. Discussed different food groups and their effects on blood sugar, emphasizing carbohydrate-containing foods. Provided list of carbohydrates and recommended serving sizes of common foods.  Discussed importance of controlled and consistent carbohydrate intake throughout the day. Provided examples of ways to balance meals/snacks and encouraged intake of high-fiber, whole grain complex carbohydrates. Teach back method used.  Expect good compliance.  Body mass index is 40.44 kg/m. Pt meets criteria for morbid obesity based on current BMI.  Current diet order is soft , patient is consuming approximately 100% of meals at this time. Labs and medications reviewed. No further nutrition interventions warranted at this time. RD contact information provided. If additional nutrition issues arise, please re-consult RD.  Betsey Holidayasey Mekaela Azizi MS, RD, LDN Pager #- [phone] After Hours Pager: [phone]

## 2016-12-12 NOTE — Progress Notes (Signed)
[employer] - [place]   PATIENT NAME: Richard Hunt    MR#:  [id number]  DATE OF BIRTH:  1960/09/26  SUBJECTIVE:  CHIEF COMPLAINT:   Chief Complaint  Patient presents with  . Abdominal Pain   The patient has no abdominal pain, status post laparoscopy cholecystectomy. No BM. REVIEW OF SYSTEMS:  Review of Systems  Constitutional: Negative for chills, fever and malaise/fatigue.  HENT: Negative for sore throat.   Eyes: Negative for blurred vision and double vision.  Respiratory: Negative for cough, hemoptysis, shortness of breath, wheezing and stridor.   Cardiovascular: Negative for chest pain, palpitations, orthopnea and leg swelling.  Gastrointestinal: Negative for abdominal pain, blood in stool, diarrhea, melena, nausea and vomiting.  Genitourinary: Negative for dysuria, flank pain and hematuria.  Musculoskeletal: Negative for back pain and joint pain.  Neurological: Negative for dizziness, sensory change, focal weakness, seizures, loss of consciousness, weakness and headaches.  Endo/Heme/Allergies: Negative for polydipsia.  Psychiatric/Behavioral: Negative for depression. The patient is not nervous/anxious.     DRUG ALLERGIES:  No Known Allergies VITALS:  Blood pressure 133/81, pulse 83, temperature (!) 97.5 F (36.4 C), temperature source Oral, resp. rate (!) 24, height  (1.651 m), weight 243 lb (110.2 kg), SpO2 100 %. PHYSICAL EXAMINATION:  Physical Exam  Constitutional: He is oriented to person, place, and time.  Morbid obesity  HENT:  Head: Normocephalic.  Mouth/Throat: Oropharynx is clear and moist.  Eyes: Conjunctivae and EOM are normal. No scleral icterus.  Neck: Normal range of motion. Neck supple. No JVD present. No tracheal deviation present.  Cardiovascular: Normal rate, regular rhythm and normal heart sounds.  Exam reveals no gallop.   No murmur heard. Pulmonary/Chest: Effort normal and breath sounds normal. No  respiratory distress. He has no wheezes. He has no rales.  Abdominal: Soft. Bowel sounds are normal. He exhibits no distension. There is tenderness. There is no rebound.  Musculoskeletal: Normal range of motion. He exhibits no edema or tenderness.  Neurological: He is alert and oriented to person, place, and time. No cranial nerve deficit.  Skin: No rash noted. No erythema.  Psychiatric: Affect normal.   LABORATORY PANEL:  Male CBC  Recent Labs Lab 12/12/16 0452  WBC 17.1*  HGB 12.3*  HCT 35.5*  PLT 150   ------------------------------------------------------------------------------------------------------------------ Chemistries   Recent Labs Lab 12/12/16 0452  NA 136  K 3.6  CL 103  CO2 26  GLUCOSE 150*  BUN 16  CREATININE 0.91  CALCIUM 8.2*  AST 59*  ALT 48  ALKPHOS 80  BILITOT 0.9   RADIOLOGY:  No results found. ASSESSMENT AND PLAN:    Acute cholecystitis with leukocytosis. Status post laparoscopy cholecystectomy POD1. Continue zosyn. Pain control. Leukocytosis is improving. Follow-up CBC.    Diabetes (HCC)  BS is better controlled. A1C 10.4. On sliding scale insulin with corresponding glucose checks, added lantus HS. He need metformin per DM coordinator. But the patient said he had bad reaction to metformin before.    HTN (hypertension). Controlled without hypertension medication.Marland Kitchen    HLD (hyperlipidemia) Unremarkable lipid panel.  Morbid obesity.  All the records are reviewed and case discussed with Care Management/Social Worker. Management plans discussed with the patient, his wife and they are in agreement.  CODE STATUS: Full Code  TOTAL TIME TAKING CARE OF THIS PATIENT: 36 minutes.   More than 50% of the time was spent in counseling/coordination of care: YES  POSSIBLE D/C IN 1-2 DAYS, DEPENDING ON CLINICAL CONDITION.  Trayden Brandy M.D on 9/11Shaune Pollack/2018 at 3:53 PM  Between 7am to 6pm - Pager - [phone]  After 6pm go to [email or web address] -  Therapist, nutritionalpassword [email or web address]  [employer]  Hospitalists

## 2016-12-12 NOTE — Anesthesia Postprocedure Evaluation (Signed)
Anesthesia Post Note  Patient: Richard Hunt  Procedure(s) Performed: Procedure(s) (LRB): LAPAROSCOPIC CHOLECYSTECTOMY (N/A)  Patient location during evaluation: PACU Anesthesia Type: General Level of consciousness: awake and alert Pain management: pain level controlled Vital Signs Assessment: post-procedure vital signs reviewed and stable Respiratory status: spontaneous breathing, nonlabored ventilation, respiratory function stable and patient connected to nasal cannula oxygen Cardiovascular status: blood pressure returned to baseline and stable Postop Assessment: no signs of nausea or vomiting Anesthetic complications: no     Last Vitals:  Vitals:   12/11/16 2314 12/12/16 0504  BP: 123/67 (!) 154/75  Pulse: 90 94  Resp: 20 (!) 24  Temp: 36.8 C (!) 36.4 C  SpO2: 97% 92%    Last Pain:  Vitals:   12/12/16 0519  TempSrc:   PainSc: 7                  Cleda MccreedyJoseph K Eilan Mcinerny

## 2016-12-12 NOTE — Progress Notes (Signed)
1 Day Post-Op   Subjective:  56 year old male status post laparoscopic cholecystectomy for gangrenous cholecystitis. Patient reports feeling better this morning but continues to have some discomfort in the right upper quadrant. Tolerating his diet without difficulty. Currently still on oxygen though.  Vital signs in last 24 hours: Temp:  [97.4 F (36.3 C)-100.8 F (38.2 C)] 97.5 F (36.4 C) (09/11 0504) Pulse Rate:  [90-108] 94 (09/11 0504) Resp:  [17-30] 24 (09/11 0504) BP: (121-158)/(67-96) 154/75 (09/11 0504) SpO2:  [91 %-98 %] 92 % (09/11 0504) Last BM Date: 12/09/16  Intake/Output from previous day: 09/10 0701 - 09/11 0700 In: 3189 [I.V.:3099; IV Piggyback:90] Out: 2090 [Urine:2075; Blood:15]  GI: Abdomen soft, nondistended, appropriately tender to palpation at his incision sites. Incisions are well approximated with dressings in place are clean, dry, intact.  Lab Results:  CBC  Recent Labs  12/11/16 0303 12/12/16 0452  WBC 22.2* 17.1*  HGB 13.6 12.3*  HCT 38.0* 35.5*  PLT 182 150   CMP     Component Value Date/Time   NA 136 12/12/2016 0452   K 3.6 12/12/2016 0452   CL 103 12/12/2016 0452   CO2 26 12/12/2016 0452   GLUCOSE 150 (H) 12/12/2016 0452   BUN 16 12/12/2016 0452   CREATININE 0.91 12/12/2016 0452   CALCIUM 8.2 (L) 12/12/2016 0452   PROT 6.5 12/12/2016 0452   ALBUMIN 2.8 (L) 12/12/2016 0452   AST 59 (H) 12/12/2016 0452   ALT 48 12/12/2016 0452   ALKPHOS 80 12/12/2016 0452   BILITOT 0.9 12/12/2016 0452   GFRNONAA >60 12/12/2016 0452   GFRAA >60 12/12/2016 0452   PT/INR No results for input(s): LABPROT, INR in the last 72 hours.  Studies/Results: Dg Chest 2 View  Result Date: 12/11/2016 CLINICAL DATA:  56 year old male with a history of cholelithiasis. Preoperative chest x-ray EXAM: CHEST  2 VIEW COMPARISON:  None. FINDINGS: Cardiomediastinal silhouette within normal limits. No evidence of central vascular congestion. No pneumothorax or pleural  effusion. Low lung volumes. No confluent airspace disease. Likely atelectasis. No displaced fracture IMPRESSION: Low lung volumes without evidence of acute cardiopulmonary disease Electronically Signed   By: Gilmer Mor D.O.   On: 12/11/2016 08:56   Ct Abdomen Pelvis W Contrast  Result Date: 12/10/2016 CLINICAL DATA:  Abdominal pain with nausea and vomiting which began at 6 p.m. tonight. EXAM: CT ABDOMEN AND PELVIS WITH CONTRAST TECHNIQUE: Multidetector CT imaging of the abdomen and pelvis was performed using the standard protocol following bolus administration of intravenous contrast. CONTRAST:  100 ml ISOVUE-300 IOPAMIDOL (ISOVUE-300) INJECTION 61% COMPARISON:  Right upper quadrant ultrasound this same day. FINDINGS: Lower chest: Mild dependent atelectasis is seen lung bases. No pleural or pericardial effusion. Heart size is normal. Hepatobiliary: Fatty infiltration of the liver is identified. No focal lesion. There is stranding about the gallbladder. No stones are identified. Pancreas: Unremarkable. No pancreatic ductal dilatation or surrounding inflammatory changes. Spleen: Normal in size without focal abnormality. Adrenals/Urinary Tract: 2 small left renal cysts are identified. There is a punctate low attenuating lesion the right kidney which is also likely a cyst. The kidneys are otherwise unremarkable. Ureters and urinary bladder appear normal. The adrenal glands are normal appearance P Stomach/Bowel: Stomach is within normal limits. Appendix appears normal. No evidence of bowel wall thickening, distention, or inflammatory changes. Vascular/Lymphatic: Aortic atherosclerosis. No enlarged abdominal or pelvic lymph nodes. Reproductive: The prostate gland is mildly prominent. Other: No ascites. Very small fat containing umbilical hernia noted. Musculoskeletal: No lytic or  sclerotic bony lesion. No acute bony abnormality. IMPRESSION: Stranding about the gallbladder worrisome for inflammatory change. Note is  made the patient's right upper quadrant ultrasound showed only some gallbladder sludge. Fatty infiltration of the liver. Atherosclerosis. Mild prostatomegaly. Very small fat containing umbilical hernia. Electronically Signed   By: Drusilla Kannerhomas  Dalessio M.D.   On: 12/10/2016 23:27    Assessment/Plan: 56 year old male status post laparoscopic cholecystectomy for gangrenous cholecystitis. Appreciate internal medicine assistance with his chronic medical problems. Advance his diet regular. Attempt to wean off of oxygen as tolerated. Encourage ambulation and incentive spirometer. Discussed with the patient that we would continue IV antibiotics until his white blood cell count was closer to normal prior to converting to a course of oral antibiotics.   Ricarda Frameharles Joscelynn Brutus, MD [facility] HospitalFACS General Surgeon [facility]  Day ASCOM [phone](7a-7p) [phone] Night ASCOM [phone](7p-7a) [phone]  12/12/2016

## 2016-12-12 NOTE — Progress Notes (Signed)
Inpatient Diabetes Program Recommendations  AACE/ADA: New Consensus Statement on Inpatient Glycemic Control (2015)  Target Ranges:  Prepandial:   less than 140 mg/dL      Peak postprandial:   less than 180 mg/dL (1-2 hours)      Critically ill patients:  140 - 180 mg/dL   Lab Results  Component Value Date   GLUCAP 163 (H) 12/12/2016   HGBA1C 10.4 (H) 12/11/2016    Review of Glycemic ControlResults for Richard FortsFRAUSTO Hunt, [name] (MRN [id number]) as of 12/12/2016 12:05  Ref. Range 12/11/2016 16:40 12/11/2016 20:46 12/11/2016 23:12 12/12/2016 05:06 12/12/2016 07:36  Glucose-Capillary Latest Ref Range: 65 - 99 mg/dL 409316 (H) 811277 (H) 914164 (H) 141 (H) 163 (H)    Diabetes history: Type 2 diabetes Outpatient Diabetes medications: None Current orders for Inpatient glycemic control:  Novolog resistant q 4 hours, Lantus 20 units q HS  Inpatient Diabetes Program Recommendations:    Note elevated A1C.  Discussed in detail with patient and wife.  He works [employer] and drinks lots of gatorade/juices.  He has been on oral agents for diabetes in the past but stopped taking.  We discussed complications of diabetes, hypoglycemia and hyperglycemia.  Patient admits that he does not check his blood sugars b/c he does not like to stick his fingers.  We discussed importance of using sides of fingers for less pain and washing hands.  We also discussed normal blood sugar values (80-120 mg/dL and no greater than 782180 mg/dL after eating).  He has not had education regarding diabetes in the past but is interested in attending outpatient diabetes education.  We briefly discussed diet but note that dietician consult in place.  I also will order him Living Well with diabetes in Spanish per patient request.  Discussed with MD. Patient likely to d/c on oral agents for diabetes and not insulin, however he will need to follow-up ASAP.   Thanks, Beryl MeagerJenny Kinshasa Throckmorton, RN, BC-ADM Inpatient Diabetes Coordinator Pager [phone]  (8a-5p)

## 2016-12-12 NOTE — Progress Notes (Signed)
Pt ambulated around the nursing station twice O2 was checked while ambulating and pt.'s O2 dropped to 88 % on room air, pt stopped and took a couple of deep breaths and O2 jumped back to 93 %. RN will continue to encourage ambulation.  Martisha Toulouse [place]itten[place]

## 2016-12-13 LAB — GLUCOSE, CAPILLARY
GLUCOSE-CAPILLARY: 136 mg/dL — AB (ref 65–99)
GLUCOSE-CAPILLARY: 124 mg/dL — AB (ref 65–99)
GLUCOSE-CAPILLARY: 176 mg/dL — AB (ref 65–99)

## 2016-12-13 LAB — CBC
HCT: 36.4 % — ABNORMAL LOW (ref 40.0–52.0)
Hemoglobin: 12.9 g/dL — ABNORMAL LOW (ref 13.0–18.0)
MCH: 31.7 pg (ref 26.0–34.0)
MCHC: 35.4 g/dL (ref 32.0–36.0)
MCV: 89.4 fL (ref 80.0–100.0)
PLATELETS: 174 10*3/uL (ref 150–440)
RBC: 4.07 MIL/uL — AB (ref 4.40–5.90)
RDW: 12.7 % (ref 11.5–14.5)
WBC: 11.9 10*3/uL — AB (ref 3.8–10.6)

## 2016-12-13 MED ORDER — AMOXICILLIN-POT CLAVULANATE 875-125 MG PO TABS
1.0000 | ORAL_TABLET | Freq: Two times a day (BID) | ORAL | Status: DC
  Administered 2016-12-13: 1 via ORAL
  Filled 2016-12-13: qty 1

## 2016-12-13 MED ORDER — OXYCODONE-ACETAMINOPHEN 5-325 MG PO TABS: 1.0000 | ORAL_TABLET | ORAL | 0 refills | Status: DC | PRN

## 2016-12-13 MED ORDER — GLIPIZIDE 5 MG PO TABS
5.0000 mg | ORAL_TABLET | Freq: Two times a day (BID) | ORAL | Status: DC
  Filled 2016-12-13: qty 1

## 2016-12-13 MED ORDER — AMOXICILLIN-POT CLAVULANATE 875-125 MG PO TABS: 1.0000 | ORAL_TABLET | Freq: Two times a day (BID) | ORAL | 0 refills | Status: AC

## 2016-12-13 MED ORDER — GLIPIZIDE 5 MG PO TABS
5.0000 mg | ORAL_TABLET | Freq: Two times a day (BID) | ORAL | 0 refills | Status: DC
Start: 2016-12-13 — End: 2023-12-29

## 2016-12-13 NOTE — Discharge Summary (Signed)
Patient ID: Richard Hunt MRN: [id number] DOB/AGE: [birth year] 56 y.o.  Admit date: 12/10/2016 Discharge date: 12/13/2016   Discharge Diagnoses:  Principal Problem:   Acute cholecystitis Active Problems:   Diabetes (HCC)   HTN (hypertension)   HLD (hyperlipidemia)   Procedures: Laparoscopic cholecystectomy  Hospital Course:  Patient was admitted on 9/9 with acute cholecystitis.  He went to the OR on 9/10 and underwent a laparoscopic cholecystectomy.  Post-op, he was on IV antibiotics until 9/12 due to an elevated WBC.  It improved significantly on 9/12 and was transitioned to oral Augmentin.  His diabetes was poorly controlled and internal medicine was consulted.  He was started on glipizide 5 mg BID.  His pain was well controlled, was tolerating a soft diet, was ambulating, had regained bowel function, and was deemed ready for discharge.  Consults:  Internal Medicine  Disposition: 01-Home or Self Care  Discharge Instructions    Ambulatory referral to Nutrition and Diabetic Education    Complete by:  As directed    Will need 1:1- speaks English/Spanish- may need translator-A1C=10.4%   Call MD for:  difficulty breathing, headache or visual disturbances    Complete by:  As directed    Call MD for:  persistant nausea and vomiting    Complete by:  As directed    Call MD for:  redness, tenderness, or signs of infection (pain, swelling, redness, odor or green/yellow discharge around incision site)    Complete by:  As directed    Call MD for:  severe uncontrolled pain    Complete by:  As directed    Call MD for:  temperature >100.4    Complete by:  As directed    Diet Carb Modified    Complete by:  As directed    Discharge instructions    Complete by:  As directed    1.  Patient may shower, but do not scrub wound heavily and dab dry only. 2.  Do not submerge wounds in pool/tub 3.  Do not remove steri strips and allow them to fall off on their own. 4.  It is important that  you take your antibiotics until completed. 5.  It is important that you follow up with your PCP as soon as possible for your diabetes management.   Driving Restrictions    Complete by:  As directed    Do not drive while taking narcotics for pain control.   Increase activity slowly    Complete by:  As directed    Lifting restrictions    Complete by:  As directed    No heavy lifting or pushing of more than 10-15 lbs for 4 weeks.   No dressing needed    Complete by:  As directed    Do not remove steri strips.     Allergies as of 12/13/2016   No Known Allergies     Medication List    STOP taking these medications   ondansetron 4 MG disintegrating tablet Commonly known as:  ZOFRAN ODT     TAKE these medications   amoxicillin-clavulanate 875-125 MG tablet Commonly known as:  AUGMENTIN Take 1 tablet by mouth every 12 (twelve) hours.   glipiZIDE 5 MG tablet Commonly known as:  GLUCOTROL Take 1 tablet (5 mg total) by mouth 2 (two) times daily before a meal.   oxyCODONE-acetaminophen 5-325 MG tablet Commonly known as:  PERCOCET/ROXICET Take 1-2 tablets by mouth every 4 (four) hours as needed for moderate pain.   traMADol 50  MG tablet Commonly known as:  ULTRAM Take 1 tablet (50 mg total) by mouth every 6 (six) hours as needed.            Discharge Care Instructions        Start     Ordered   12/13/16 0000  amoxicillin-clavulanate (AUGMENTIN) 875-125 MG tablet  Every 12 hours     12/13/16 1538   12/13/16 0000  glipiZIDE (GLUCOTROL) 5 MG tablet  2 times daily before meals     12/13/16 1538   12/13/16 0000  oxyCODONE-acetaminophen (PERCOCET/ROXICET) 5-325 MG tablet  Every 4 hours PRN     12/13/16 1538   12/13/16 0000  Increase activity slowly     12/13/16 1538   12/13/16 0000  Driving Restrictions    Comments:  Do not drive while taking narcotics for pain control.   12/13/16 1538   12/13/16 0000  Lifting restrictions    Comments:  No heavy lifting or pushing of more  than 10-15 lbs for 4 weeks.   12/13/16 1538   12/13/16 0000  Call MD for:  temperature >100.4     12/13/16 1538   12/13/16 0000  Call MD for:  persistant nausea and vomiting     12/13/16 1538   12/13/16 0000  Call MD for:  severe uncontrolled pain     12/13/16 1538   12/13/16 0000  Call MD for:  redness, tenderness, or signs of infection (pain, swelling, redness, odor or green/yellow discharge around incision site)     12/13/16 1538   12/13/16 0000  Call MD for:  difficulty breathing, headache or visual disturbances     12/13/16 1538   12/13/16 0000  Discharge instructions    Comments:  1.  Patient may shower, but do not scrub wound heavily and dab dry only. 2.  Do not submerge wounds in pool/tub 3.  Do not remove steri strips and allow them to fall off on their own. 4.  It is important that you take your antibiotics until completed. 5.  It is important that you follow up with your PCP as soon as possible for your diabetes management.   12/13/16 1538   12/13/16 0000  Diet Carb Modified     12/13/16 1538   12/13/16 0000  No dressing needed    Comments:  Do not remove steri strips.   12/13/16 1538   12/12/16 0000  Ambulatory referral to Nutrition and Diabetic Education    Comments:  Will need 1:1- speaks English/Spanish- may need translator-A1C=10.4%   12/12/16 1211     Follow-up Information    Ricarda Frame, MD Follow up in 2 week(s).   Specialty:  General Surgery Contact information: [address] [place] [address] [phone]        [facility] Follow up.   Specialty:  General Practice Why:  Follow up with your PCP as soon as possible for your diabetes management. Contact information: [address] [place] [address] [phone]

## 2016-12-13 NOTE — Progress Notes (Signed)
12/13/2016  Subjective: Patient is 2 Days Post-Op status post laparoscopic cholecystectomy. Patient had some bloatedness yesterday of with diet progression but did better in the evening. He is now on a soft diet for breakfast. The pain is controlled though he's still having some pain at the epigastric incision. Reports having flatus.  Vital signs: Temp:  [97.5 F (36.4 C)-98.7 F (37.1 C)] 98.2 F (36.8 C) (09/12 0432) Pulse Rate:  [83-87] 85 (09/12 0432) Resp:  [19-20] 20 (09/12 0432) BP: (131-133)/(71-81) 131/78 (09/12 0432) SpO2:  [94 %-100 %] 94 % (09/12 0432)   Intake/Output: 09/11 0701 - 09/12 0700 In: 1923.8 [P.O.:480; I.V.:1343.8; IV Piggyback:100] Out: 900 [Urine:900] Last BM Date: 12/09/16  Physical Exam: Constitutional: No acute distress Abdomen:  Soft, nondistended, appropriately tender to palpation. Incisions are clean dry and intact with Steri-Strips in place.  Labs:   Recent Labs  12/12/16 0452 12/13/16 0834  WBC 17.1* 11.9*  HGB 12.3* 12.9*  HCT 35.5* 36.4*  PLT 150 174    Recent Labs  12/11/16 0303 12/12/16 0452  NA 132* 136  K 4.3 3.6  CL 99* 103  CO2 25 26  GLUCOSE 346* 150*  BUN 20 16  CREATININE 1.08 0.91  CALCIUM 8.4* 8.2*   No results for input(s): LABPROT, INR in the last 72 hours.  Imaging: No results found.  Assessment/Plan: 56 year old male status post laparoscopic cholecystectomy.  -White blood cell count is much better today at 11.9. We will transition to oral antibiotics. -Continue soft diet today. -Continue pain control. -Appreciate internal medicine input. We'll have the patient on glipizide 5 mg twice a day. -Probable discharge to home today in the afternoon as long as the patient continues to do well. We'll be discharging with antibiotic course, pain medications, and new prescription for glipizide.   Richard Hunt Richard Luis Aleana Fifita, MD [facility]

## 2016-12-13 NOTE — Progress Notes (Signed)
Discharge order received. Patient is alert and oriented. Vital signs stable . No signs of acute distress. Discharge instructions given with an interpreter. Patient verbalized understanding. No other issues noted at this time.

## 2016-12-13 NOTE — Progress Notes (Signed)
[employer] [facility] [place] [facility]   PATIENT NAME: Richard Hunt    MR#:  [id number]  DATE OF BIRTH:  [birth year]  SUBJECTIVE:  CHIEF COMPLAINT:   Chief Complaint  Patient presents with  . Abdominal Pain   The patient has no complaint. REVIEW OF SYSTEMS:  Review of Systems  Constitutional: Negative for chills, fever and malaise/fatigue.  HENT: Negative for sore throat.   Eyes: Negative for blurred vision and double vision.  Respiratory: Negative for cough, hemoptysis, shortness of breath, wheezing and stridor.   Cardiovascular: Negative for chest pain, palpitations, orthopnea and leg swelling.  Gastrointestinal: Negative for abdominal pain, blood in stool, diarrhea, melena, nausea and vomiting.  Genitourinary: Negative for dysuria, flank pain and hematuria.  Musculoskeletal: Negative for back pain and joint pain.  Neurological: Negative for dizziness, sensory change, focal weakness, seizures, loss of consciousness, weakness and headaches.  Endo/Heme/Allergies: Negative for polydipsia.  Psychiatric/Behavioral: Negative for depression. The patient is not nervous/anxious.     DRUG ALLERGIES:  No Known Allergies VITALS:  Blood pressure 131/78, pulse 85, temperature 98.2 F (36.8 C), temperature source Oral, resp. rate 20, height 5\' 5"  (1.651 m), weight 243 lb (110.2 kg), SpO2 94 %. PHYSICAL EXAMINATION:  Physical Exam  Constitutional: He is oriented to person, place, and time.  Morbid obesity  HENT:  Head: Normocephalic.  Mouth/Throat: Oropharynx is clear and moist.  Eyes: Conjunctivae and EOM are normal. No scleral icterus.  Neck: Normal range of motion. Neck supple. No JVD present. No tracheal deviation present.  Cardiovascular: Normal rate, regular rhythm and normal heart sounds.  Exam reveals no gallop.   No murmur heard. Pulmonary/Chest: Effort normal and breath sounds normal. No respiratory distress. He has no wheezes. He has no rales.    Abdominal: Soft. Bowel sounds are normal. He exhibits no distension. There is no tenderness. There is no rebound.  Musculoskeletal: Normal range of motion. He exhibits no edema or tenderness.  Neurological: He is alert and oriented to person, place, and time. No cranial nerve deficit.  Skin: No rash noted. No erythema.  Psychiatric: Affect normal.   LABORATORY PANEL:  Male CBC  Recent Labs Lab 12/13/16 0834  WBC 11.9*  HGB 12.9*  HCT 36.4*  PLT 174   ------------------------------------------------------------------------------------------------------------------ Chemistries   Recent Labs Lab 12/12/16 0452  NA 136  K 3.6  CL 103  CO2 26  GLUCOSE 150*  BUN 16  CREATININE 0.91  CALCIUM 8.2*  AST 59*  ALT 48  ALKPHOS 80  BILITOT 0.9   RADIOLOGY:  No results found. ASSESSMENT AND PLAN:    Acute cholecystitis with leukocytosis. Status post laparoscopy cholecystectomy POD2. On zosyn. Pain control. Leukocytosis improved.    Diabetes (HCC)  BS is better controlled. A1C 10.4. On sliding scale insulin with corresponding glucose checks, added lantus HS. He need metformin per DM coordinator. But the patient said he had bad reaction to metformin before. I will add glipizide 5 mg bid (he was on before), follow up PCP for adjustment.    HTN (hypertension). Controlled without hypertension medication.Marland Kitchen.    HLD (hyperlipidemia) Unremarkable lipid panel.  Morbid obesity. Outpatient evaluation for OSA.  Medically stable, sign off.  All the records are reviewed and case discussed with Care Management/Social Worker. Management plans discussed with the patient, his wife and they are in agreement.  CODE STATUS: Full Code  TOTAL TIME TAKING CARE OF THIS PATIENT:  23 minutes.   More than 50% of the time was  spent in counseling/coordination of care: YES  POSSIBLE D/C TODAYS, DEPENDING ON CLINICAL CONDITION.   Shaune Pollack M.D on 12/13/2016 at 9:32 AM  Between 7am to 6pm -  Pager - [phone]  After 6pm go to [email or web address] - Therapist, nutritional Hospitalists

## 2016-12-14 ENCOUNTER — Telehealth: Payer: Self-pay

## 2016-12-14 NOTE — Telephone Encounter (Signed)
Post-op call made to patient at this time. Spoke with Patient. Post-op interview questions below.  1. How are you feeling? OK but sore  2. Is your pain controlled? No, but my wife is just picking up my pain medicine now.  3. What are you doing for the pain? Nothing at this time.  4. Are you having any Nausea or Vomiting? No  5. Are you having any Fever or Chills? No  6. Are you having any Constipation or Diarrhea? None  7. Is there any Swelling or Bruising you are concerned about? No  8. Do you have any questions or concerns at this time? None   Discussion: Reviewed post-op appointment with patient. He verbalizes understanding and was encouraged to return phone call with any questions or concerns.

## 2016-12-20 ENCOUNTER — Ambulatory Visit
Admission: RE | Admit: 2016-12-20 | Discharge: 2016-12-20 | Disposition: A | Discharge status: Home / Self Care | Payer: Medicaid Other | Source: Ambulatory Visit | Attending: Primary Care | Admitting: Primary Care

## 2016-12-20 ENCOUNTER — Emergency Department
Admission: EM | Admit: 2016-12-20 | Discharge: 2016-12-20 | Disposition: A | Discharge status: Home / Self Care | Payer: Medicaid Other | Attending: Emergency Medicine | Admitting: Emergency Medicine

## 2016-12-20 ENCOUNTER — Emergency Department: Payer: Medicaid Other

## 2016-12-20 ENCOUNTER — Encounter: Payer: Self-pay | Admitting: *Deleted

## 2016-12-20 ENCOUNTER — Other Ambulatory Visit: Payer: Self-pay | Admitting: Primary Care

## 2016-12-20 DIAGNOSIS — M7122 Synovial cyst of popliteal space [Baker], left knee: Secondary | ICD-10-CM | POA: Insufficient documentation

## 2016-12-20 DIAGNOSIS — M6520 Calcific tendinitis, unspecified site: Secondary | ICD-10-CM

## 2016-12-20 DIAGNOSIS — Z7984 Long term (current) use of oral hypoglycemic drugs: Secondary | ICD-10-CM | POA: Insufficient documentation

## 2016-12-20 DIAGNOSIS — M65262 Calcific tendinitis, left lower leg: Secondary | ICD-10-CM | POA: Diagnosis not present

## 2016-12-20 DIAGNOSIS — E119 Type 2 diabetes mellitus without complications: Secondary | ICD-10-CM | POA: Insufficient documentation

## 2016-12-20 DIAGNOSIS — I10 Essential (primary) hypertension: Secondary | ICD-10-CM | POA: Diagnosis not present

## 2016-12-20 DIAGNOSIS — I872 Venous insufficiency (chronic) (peripheral): Secondary | ICD-10-CM

## 2016-12-20 DIAGNOSIS — M79605 Pain in left leg: Secondary | ICD-10-CM

## 2016-12-20 DIAGNOSIS — R600 Localized edema: Secondary | ICD-10-CM

## 2016-12-20 DIAGNOSIS — M7732 Calcaneal spur, left foot: Secondary | ICD-10-CM

## 2016-12-20 NOTE — ED Triage Notes (Signed)
Pt to ED reporting he was seen at the clinic for left leg pain for the past two days. PT had US done that showed a Bakers cyst in left leg. NO DVT noted. PT has strong pedal pulse and no decreased movement. Sensation and color intact. PT reports pain when ambulating but denies pain when pressure is applied to calf.

## 2016-12-20 NOTE — ED Notes (Signed)
This RN called [facility] to ask what blood was drawn and reported elevated. Nurse reported D-dimer was elevated at 3.2 and pt was sent to the ED to rule out a DVT.

## 2016-12-20 NOTE — ED Provider Notes (Signed)
[facility] Emergency Department Provider Note  ____________________________________________  Time seen: Approximately 3:37 PM  I have reviewed the triage vital signs and the nursing notes.   HISTORY  Chief Complaint Leg Pain    HPI Richard Hunt is a 56 y.o. male that presents to the emergency department with left foot pain and swelling for 1 day. Patient saw his PCP this morning for symptoms and had an ultrasound of his leg this afternoon. He was called after the ultrasound and was told that his d-dimer came back high and to come to the emergency room for evaluation.Gallbladder was removed 9 days ago. He has not had any complications after surgery. He has been taking Percocet since surgery, which helps with the pain. He denies abdominal pain, nausea, vomiting, numbness, tingling.   Past Medical History:  Diagnosis Date  . Acute kidney injury (nontraumatic) (HCC)   . Diabetes mellitus without complication (HCC)   . GERD (gastroesophageal reflux disease)   . HLD (hyperlipidemia)   . Hypercholesterolemia   . Hypertension   . Osteoarthritis   . Polycystic kidney disease     Patient Active Problem List   Diagnosis Date Noted  . Acute cholecystitis 12/11/2016  . Diabetes (HCC) 12/11/2016  . HTN (hypertension) 12/11/2016  . HLD (hyperlipidemia) 12/11/2016    Past Surgical History:  Procedure Laterality Date  . CHOLECYSTECTOMY N/A 12/11/2016   Procedure: LAPAROSCOPIC CHOLECYSTECTOMY;  Surgeon: Ricarda Frame, MD;  Location: [facility] ORS;  Service: General;  Laterality: N/A;  . VEIN SURGERY      Prior to Admission medications   Medication Sig Start Date End Date Taking? Authorizing Provider  amoxicillin-clavulanate (AUGMENTIN) 875-125 MG tablet Take 1 tablet by mouth every 12 (twelve) hours. 12/13/16 12/23/16  Henrene Dodge, MD  glipiZIDE (GLUCOTROL) 5 MG tablet Take 1 tablet (5 mg total) by mouth 2 (two) times daily before a meal. 12/13/16  01/12/17  Piscoya, Elita Quick, MD  oxyCODONE-acetaminophen (PERCOCET/ROXICET) 5-325 MG tablet Take 1-2 tablets by mouth every 4 (four) hours as needed for moderate pain. 12/13/16   Henrene Dodge, MD  traMADol (ULTRAM) 50 MG tablet Take 1 tablet (50 mg total) by mouth every 6 (six) hours as needed. 12/10/16   Irean Hong, MD    Allergies Patient has no known allergies.  Family History  Problem Relation Age of Onset  . Diabetes Mother     Social History Social History  Substance Use Topics  . Smoking status: Never Smoker  . Smokeless tobacco: Never Used  . Alcohol use No     Review of Systems  Constitutional: No fever/chills Cardiovascular: No chest pain. Respiratory: No SOB. Gastrointestinal: No abdominal pain.  No nausea, no vomiting.  Musculoskeletal: Positive for foot pain. Skin: Negative for rash, abrasions, lacerations, ecchymosis. Neurological: Negative for headaches, numbness or tingling   ____________________________________________   PHYSICAL EXAM:  VITAL SIGNS: ED Triage Vitals  Enc Vitals Group     BP 12/20/16 1437 132/89     Pulse Rate 12/20/16 1437 91     Resp 12/20/16 1437 16     Temp 12/20/16 1437 97.9 F (36.6 C)     Temp Source 12/20/16 1437 Oral     SpO2 12/20/16 1437 98 %     Weight 12/20/16 1437 243 lb (110.2 kg)     Height 12/20/16 1437  (1.651 m)     Head Circumference --      Peak Flow --      Pain Score 12/20/16 1436 7  Pain Loc --      Pain Edu? --      Excl. in GC? --      Constitutional: Alert and oriented. Well appearing and in no acute distress. Eyes: Conjunctivae are normal. PERRL. EOMI. Head: Atraumatic. ENT:      Ears:      Nose: No congestion/rhinnorhea.      Mouth/Throat: Mucous membranes are moist.  Neck: No stridor.   Cardiovascular: Normal rate, regular rhythm.  Good peripheral circulation. Possible dorsalis pedis pulses. Respiratory: Normal respiratory effort without tachypnea or retractions. Lungs CTAB. Good air  entry to the bases with no decreased or absent breath sounds. Gastrointestinal: Bowel sounds 4 quadrants. Soft and nontender to palpation. No guarding or rigidity. No palpable masses. No distention.  Musculoskeletal: Full range of motion to all extremities. No gross deformities appreciated. Full range of motion of foot and ankle. Tenderness to palpation under her foot. No visible swelling to foot. Neurologic:  Normal speech and language. No gross focal neurologic deficits are appreciated.  Skin:  Skin is warm, dry and intact. Laparoscopic incisions to abdomen. No surrounding erythema or drainage.  ____________________________________________   LABS (all labs ordered are listed, but only abnormal results are displayed)  Labs Reviewed - No data to display ____________________________________________  EKG   ____________________________________________  RADIOLOGY Lexine Baton, personally viewed and evaluated these images (plain radiographs) as part of my medical decision making, as well as reviewing the written report by the radiologist.  US Venous Img Lower Unilateral Left  Result Date: 12/20/2016 CLINICAL DATA:  Left lower extremity pain and edema. History of varicose veins. Evaluate DVT. EXAM: LEFT LOWER EXTREMITY VENOUS DOPPLER ULTRASOUND TECHNIQUE: Gray-scale sonography with graded compression, as well as color Doppler and duplex ultrasound were performed to evaluate the lower extremity deep venous systems from the level of the common femoral vein and including the common femoral, femoral, profunda femoral, popliteal and calf veins including the posterior tibial, peroneal and gastrocnemius veins when visible. The superficial great saphenous vein was also interrogated. Spectral Doppler was utilized to evaluate flow at rest and with distal augmentation maneuvers in the common femoral, femoral and popliteal veins. COMPARISON:  None. FINDINGS: Contralateral Common Femoral Vein: Respiratory  phasicity is normal and symmetric with the symptomatic side. No evidence of thrombus. Normal compressibility. Common Femoral Vein: No evidence of thrombus. Normal compressibility, respiratory phasicity and response to augmentation. Saphenofemoral Junction: No evidence of thrombus. Normal compressibility and flow on color Doppler imaging. Profunda Femoral Vein: No evidence of thrombus. Normal compressibility and flow on color Doppler imaging. Femoral Vein: No evidence of thrombus. Normal compressibility, respiratory phasicity and response to augmentation. Popliteal Vein: No evidence of thrombus. Normal compressibility, respiratory phasicity and response to augmentation. Calf Veins: No evidence of thrombus. Normal compressibility and flow on color Doppler imaging. Superficial Great Saphenous Vein: No evidence of thrombus. Normal compressibility and flow on color Doppler imaging. Venous Reflux:  None. Other Findings: Note is made of an approximately 1.3 x 1.1 x 0.5 cm serpiginous fluid collection with the left popliteal fossa favored to represent a tiny Baker cyst. IMPRESSION: 1. No evidence of DVT within the left lower extremity. 2. Instilling noted tiny (approximately 1.3 cm) left-sided Baker's cyst. Electronically Signed   By: Simonne Come M.D.   On: 12/20/2016 13:20   Dg Foot Complete Left  Result Date: 12/20/2016 CLINICAL DATA:  Left foot pain for 2 days. EXAM: LEFT FOOT - COMPLETE 3+ VIEW COMPARISON:  None FINDINGS: The joint spaces are  maintained. No acute fracture is identified. Type 1 os naviculare noted. Moderate to large calcaneal heel spur. Changes of distal Achilles calcific tendinitis. IMPRESSION: No acute bony findings. Moderate to large calcaneal heel spur and calcific tendinitis involving the distal Achilles tendon. Electronically Signed   By: Rudie Meyer M.D.   On: 12/20/2016 16:35    ____________________________________________    PROCEDURES  Procedure(s) performed:     Procedures    Medications - No data to display   ____________________________________________   INITIAL IMPRESSION / ASSESSMENT AND PLAN / ED COURSE  Pertinent labs & imaging results that were available during my care of the patient were reviewed by me and considered in my medical decision making (see chart for details).  Review of the [place] CSRS was performed in accordance of the [facility] prior to dispensing any controlled drugs.   Patient presented to the emergency department for evaluation of left foot pain and swelling. Vital signs and exam are reassuring. He saw his primary care provider today and was told to come to the emergency room for elevated d-dimer. Ultrasound negative for DVT. Ultrasound consistent with Baker's cyst. Foot x-ray negative for acute abnormalities. Foot x-ray shows large heel spur and calcific tendinitis. Patient is to follow up with PCP and podiatry as directed. Patient is given ED precautions to return to the ED for any worsening or new symptoms.     ____________________________________________  FINAL CLINICAL IMPRESSION(S) / ED DIAGNOSES  Final diagnoses:  Calcific tendinitis  Heel spur, left  Synovial cyst of left popliteal space      NEW MEDICATIONS STARTED DURING THIS VISIT:  Discharge Medication List as of 12/20/2016  5:10 PM          This chart was dictated using voice recognition software/Dragon. Despite best efforts to proofread, errors can occur which can change the meaning. Any change was purely unintentional.    Enid Derry, PA-C 12/20/16 2223    Jeanmarie Plant, MD 12/20/16 [phone]

## 2017-01-03 ENCOUNTER — Encounter: Payer: Medicaid Other | Admitting: General Surgery

## 2017-01-04 ENCOUNTER — Encounter: Payer: Self-pay | Admitting: General Surgery

## 2017-01-04 ENCOUNTER — Ambulatory Visit (INDEPENDENT_AMBULATORY_CARE_PROVIDER_SITE_OTHER): Payer: Medicaid Other | Admitting: General Surgery

## 2017-01-04 VITALS — BP 149/63 | HR 80 | Temp 97.6°F | Ht 65.0 in | Wt 237.4 lb

## 2017-01-04 DIAGNOSIS — Z4889 Encounter for other specified surgical aftercare: Secondary | ICD-10-CM

## 2017-01-04 NOTE — Progress Notes (Signed)
Outpatient Surgical Follow Up  01/04/2017  Richard Hunt is an 56 y.o. male.   Chief Complaint  Patient presents with  . Routine Post Op    Laparoscopic Cholecystectomy-12/11/16 Dr.Antone Summons    HPI: 56 year old male returns to clinic now 3 weeks status post laparoscopic cholecystectomy. Patient reports he continues to have intermittent discomfort to his upper midline incision were his collar was removed. Is worsened after eating and states he's also having hiccups and belching. He denies any fevers but states she's had subjective chills at home he denies any nausea, vomiting, chest pain, shortness of breath, diarrhea, constipation. He is been out of pain medications for the last 10 days. He was seen in the ER since he was discharged from the hospital due to foot pain.  Past Medical History:  Diagnosis Date  . Acute cholecystitis 12/11/2016  . Acute kidney injury (nontraumatic) (HCC)   . Bilateral edema of lower extremity 01/02/2014  . Chronic venous insufficiency 01/16/2014  . Diabetes (HCC) 12/11/2016  . Diabetes mellitus without complication (HCC)   . GERD (gastroesophageal reflux disease)   . HLD (hyperlipidemia)   . HTN (hypertension) 12/11/2016  . Hypercholesterolemia   . Hypertension   . Osteoarthritis   . Polycystic kidney disease   . Ulcer of calf (HCC) 08/29/2013    Past Surgical History:  Procedure Laterality Date  . CHOLECYSTECTOMY N/A 12/11/2016   Procedure: LAPAROSCOPIC CHOLECYSTECTOMY;  Surgeon: Ricarda Frame, MD;  Location: [facility] ORS;  Service: General;  Laterality: N/A;  . VEIN SURGERY      Family History  Problem Relation Age of Onset  . Diabetes Mother     Social History:  reports that he has never smoked. He has never used smokeless tobacco. He reports that he does not drink alcohol or use drugs.  Allergies: No Known Allergies  Medications reviewed.    ROS A multipoint review of systems was completed. All pertinent positives and negatives are  documented in the history of present illness and remainder are negative   BP (!) 149/63   Pulse 80   Temp 97.6 F (36.4 C) (Oral)   Ht  (1.651 m)   Wt 107.7 kg (237 lb 6.4 oz)   BMI 39.51 kg/m   Physical Exam Gen.: No acute distress Chest: Clear to auscultation [employer] heart: Regular rate and rhythm Abdomen: Soft, nondistended, minimally tender to deep palpation to the upper midline incision without any evidence of erythema or drainage to any incision site. Well proximal laparoscopic incision sites.    No results found for this or any previous visit (from the past 48 hour(s)). No results found.  Assessment/Plan:  1. Aftercare following surgery 56 year old male status post laparoscopic cholecystectomy for gangrenous cholecystitis. Doing very well. Having appropriate discomfort who has upper midline incision. Discussed anticipated recovery and return to normal activities. He will follow-up in clinic on an as-needed basis.     Ricarda Frame, MD FACS General Surgeon  01/04/2017,10:18 AM

## 2017-01-04 NOTE — Patient Instructions (Addendum)
GENERAL POST-OPERATIVE PATIENT INSTRUCTIONS   WOUND CARE INSTRUCTIONS:  Keep a dry clean dressing on the wound if there is drainage. The initial bandage may be removed after 24 hours.  Once the wound has quit draining you may leave it open to air.  If clothing rubs against the wound or causes irritation and the wound is not draining you may cover it with a dry dressing during the daytime.  Try to keep the wound dry and avoid ointments on the wound unless directed to do so.  If the wound becomes bright red and painful or starts to drain infected material that is not clear, please contact your physician immediately.  If the wound is mildly pink and has a thick firm ridge underneath it, this is normal, and is referred to as a healing ridge.  This will resolve over the next 4-6 weeks.  BATHING: You may shower if you have been informed of this by your surgeon. However, Please do not submerge in a tub, hot tub, or pool until incisions are completely sealed or have been told by your surgeon that you may do so.  DIET:  You may eat any foods that you can tolerate.  It is a good idea to eat a high fiber diet and take in plenty of fluids to prevent constipation.  If you do become constipated you may want to take a mild laxative or take ducolax tablets on a daily basis until your bowel habits are regular.  Constipation can be very uncomfortable, along with straining, after recent surgery.  ACTIVITY:  You are encouraged to cough and deep breath or use your incentive spirometer if you were given one, every 15-30 minutes when awake.  This will help prevent respiratory complications and low grade fevers post-operatively if you had a general anesthetic.  You may want to hug a pillow when coughing and sneezing to add additional support to the surgical area, if you had abdominal or chest surgery, which will decrease pain during these times.  You are encouraged to walk and engage in light activity for the next two weeks.  You  should not lift more than 20 pounds, until 01/21/2017 as it could put you at increased risk for complications.  Twenty pounds is roughly equivalent to a plastic bag of groceries. At that time- Listen to your body when lifting, if you have pain when lifting, stop and then try again in a few days. Soreness after doing exercises or activities of daily living is normal as you get back in to your normal routine.  MEDICATIONS:  Try to take narcotic medications and anti-inflammatory medications, such as tylenol, ibuprofen, naprosyn, etc., with food.  This will minimize stomach upset from the medication.  Should you develop nausea and vomiting from the pain medication, or develop a rash, please discontinue the medication and contact your physician.  You should not drive, make important decisions, or operate machinery when taking narcotic pain medication.  SUNBLOCK Use sun block to incision area over the next year if this area will be exposed to sun. This helps decrease scarring and will allow you avoid a permanent darkened area over your incision.  QUESTIONS:  Please feel free to call our office if you have any questions, and we will be glad to assist you. [phone]    

## 2019-06-17 IMAGING — US US ABDOMEN LIMITED
1 series · 14 of 25 positions shown · non-contrast
Comparison: None.

CLINICAL DATA: Epigastric pain.  Nausea and vomiting.

EXAM:
ULTRASOUND ABDOMEN LIMITED RIGHT UPPER QUADRANT

[Series 1: us abdomen limited · 0.27mm/px · 14 of 32 slices shown]
[im 1/32]
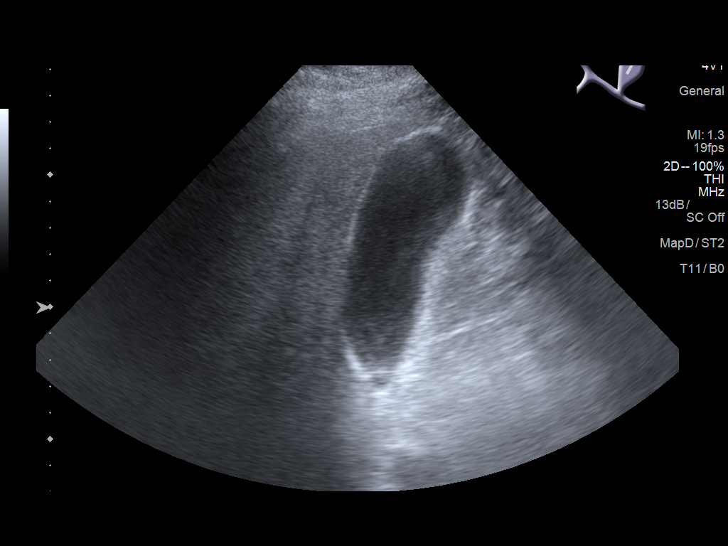
[im 3/32]
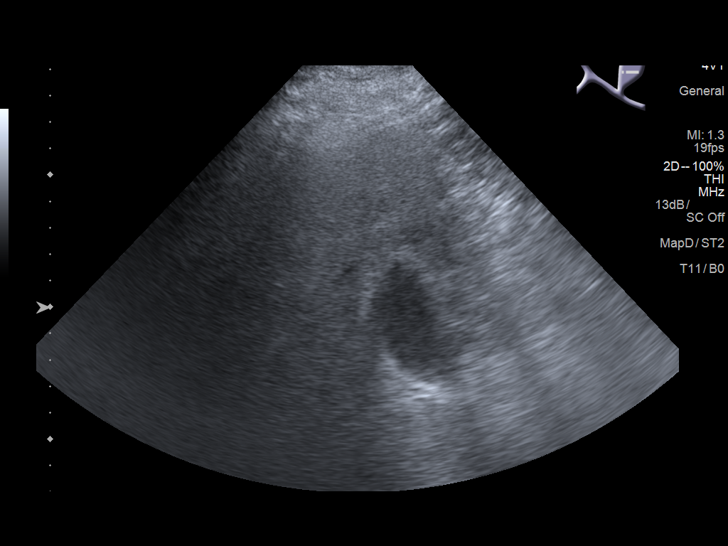
[im 6/32]
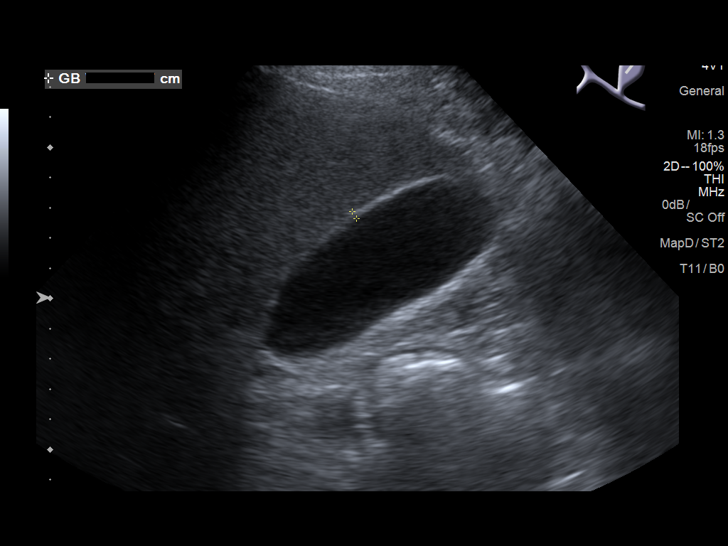
[im 8/32]
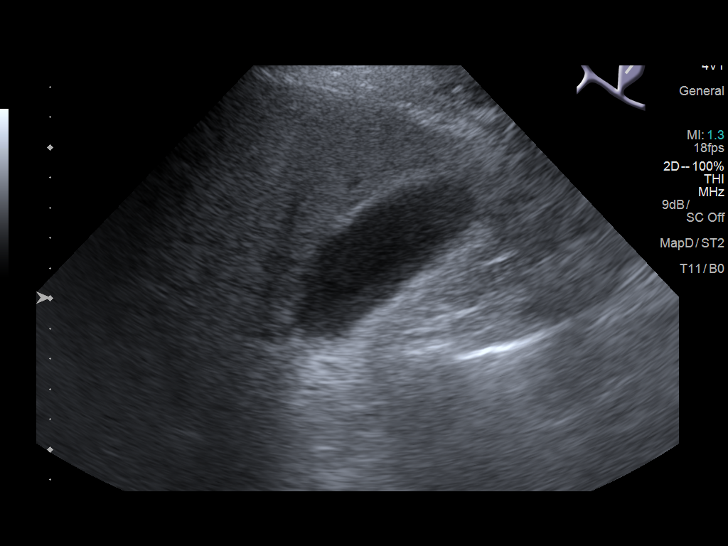
[im 11/32]
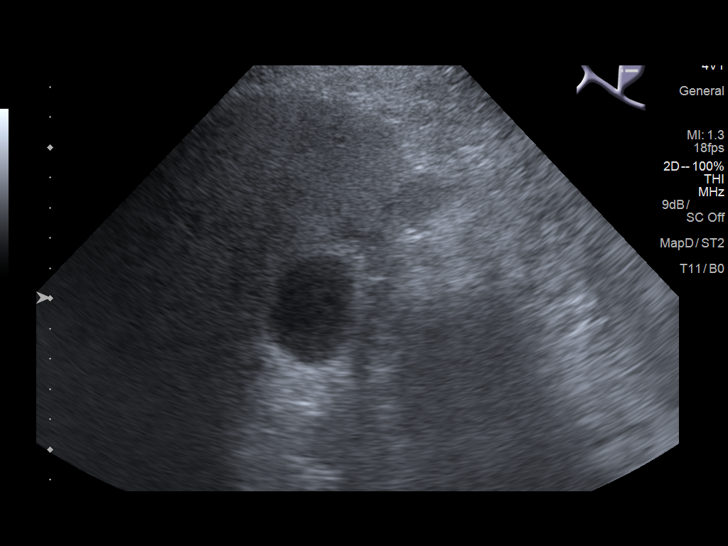
[im 12/32]
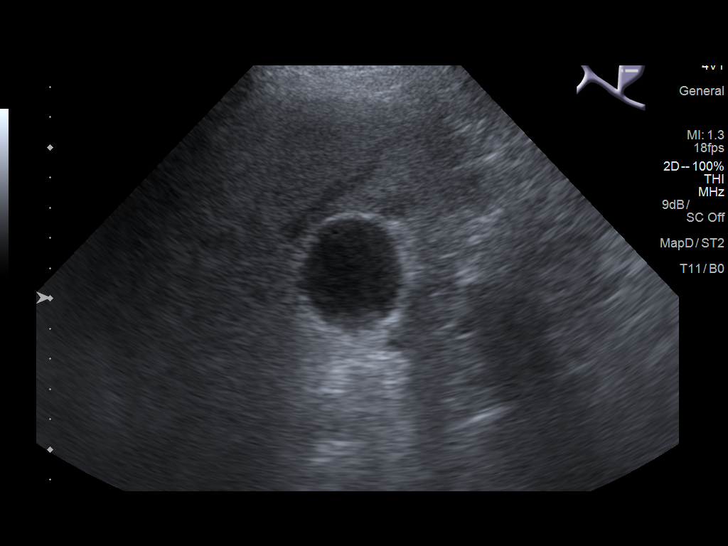
[im 15/32]
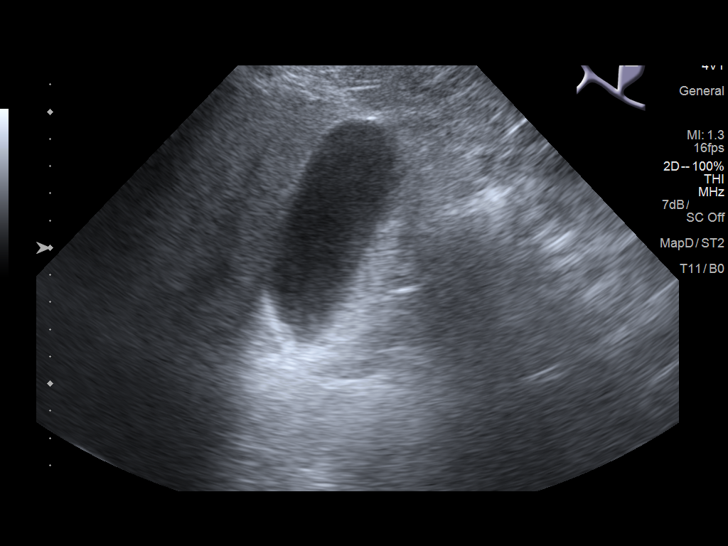
[im 17/32]
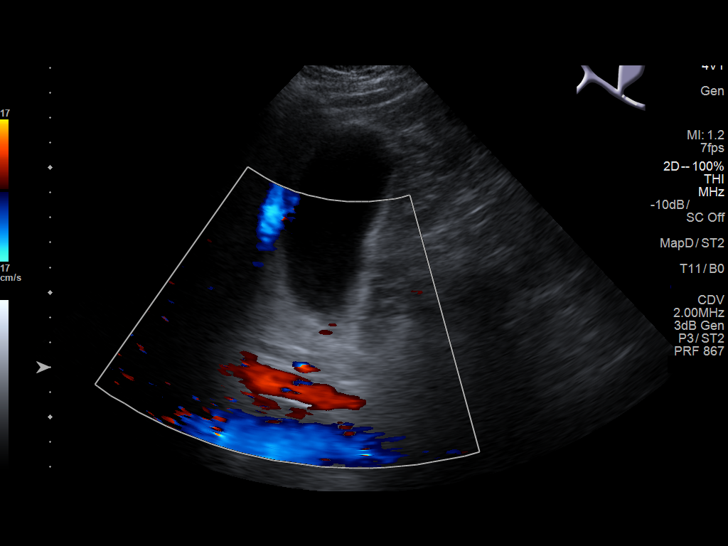
[im 20/32]
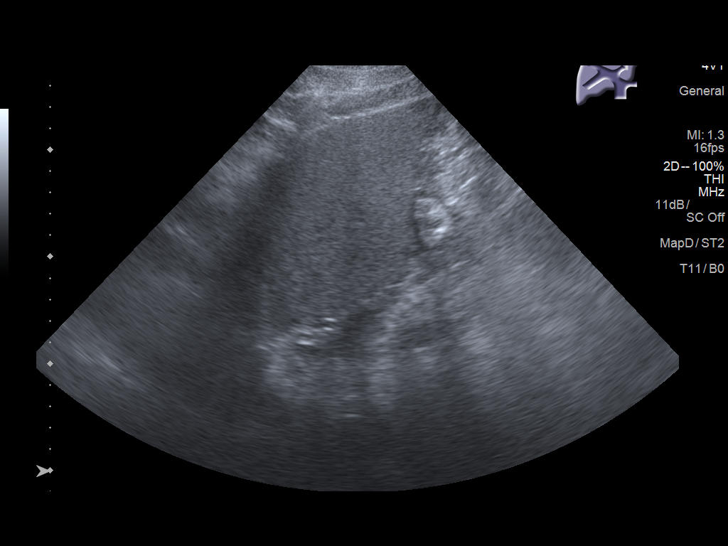
[im 21/32]
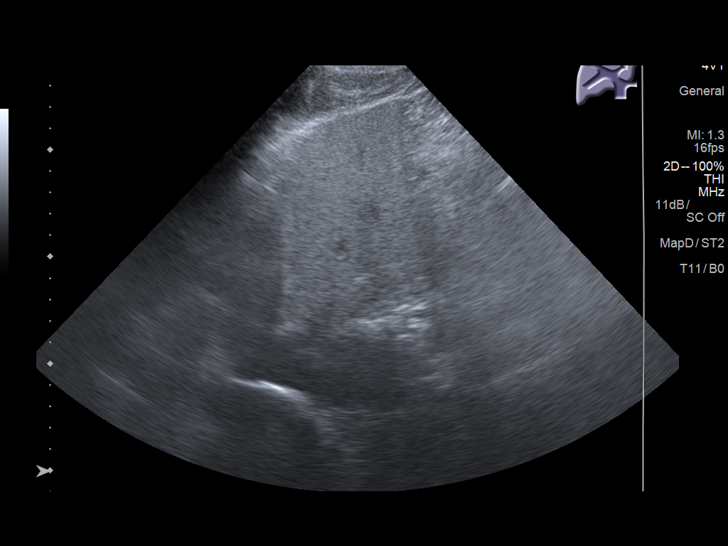
[im 24/32]
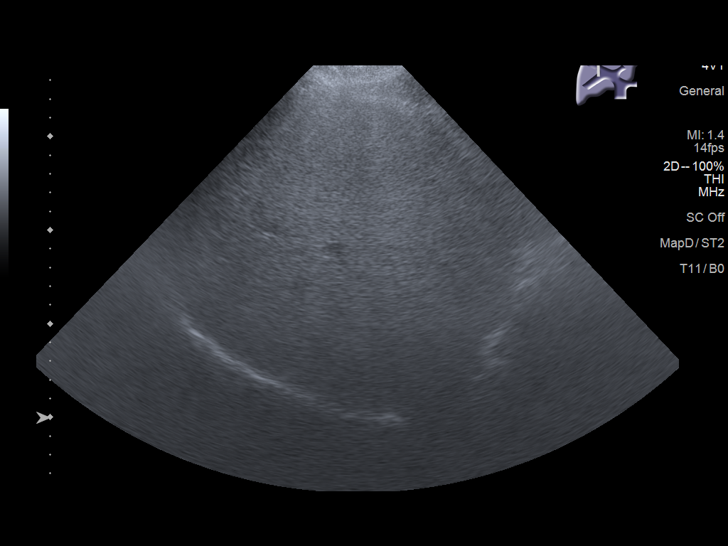
[im 26/32]
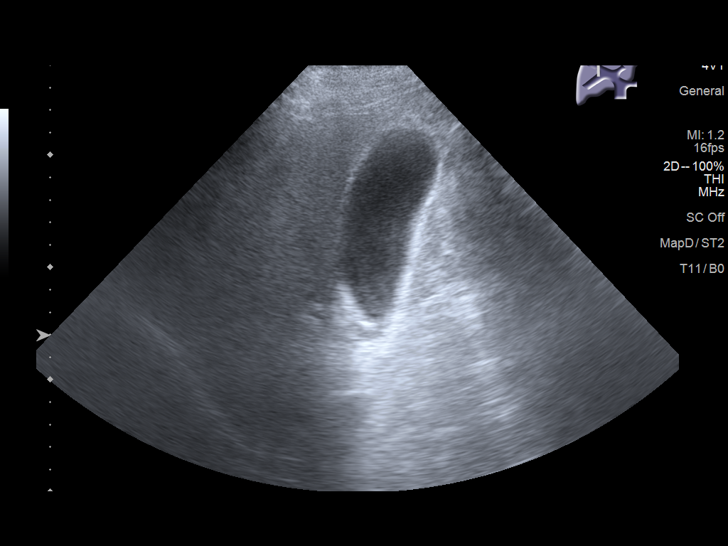
[im 29/32]
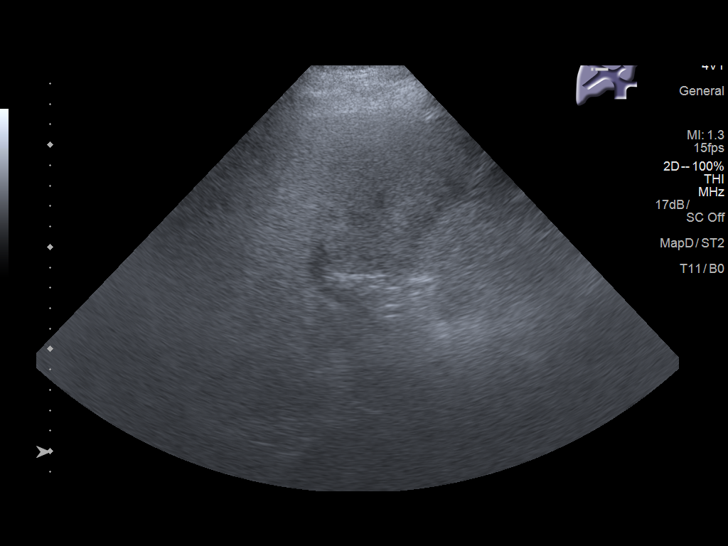
[im 32/32]
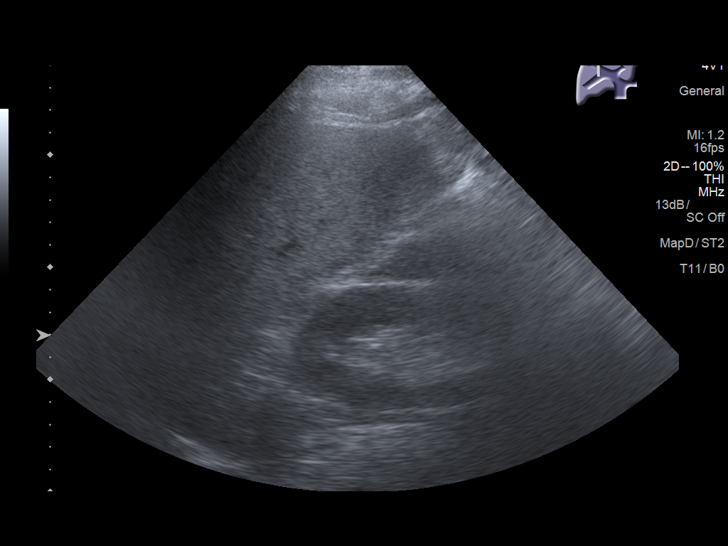

[14 of 25 positions shown; findings below may reference images not displayed]

FINDINGS: Gallbladder:

Physiologically distended. Layering sludge without shadowing stone.
No gallbladder wall thickening or pericholecystic fluid. No
sonographic Murphy sign noted by sonographer.

Common bile duct:

Diameter: 6 mm.

Liver:

Increased heterogeneous hepatic parenchyma, difficult to penetrate.
No evidence of gross focal lesion. Portal vein is patent on color
Doppler imaging with normal direction of blood flow towards the
liver.
IMPRESSION: 1. Gallbladder sludge without gallstones or gallbladder
inflammation. No biliary dilatation.
2. Hepatic steatosis, liver parenchyma is difficult to penetrate.

## 2019-06-27 IMAGING — US US EXTREM LOW VENOUS*L*
1 series · 13 of 24 positions shown · non-contrast
Comparison: None.

CLINICAL DATA: Left lower extremity pain and edema. History of
varicose veins. Evaluate DVT.



[Series 1: us extrem low venous*left* · 0.10mm/px · 13 of 40 slices shown]
[im 1/40]
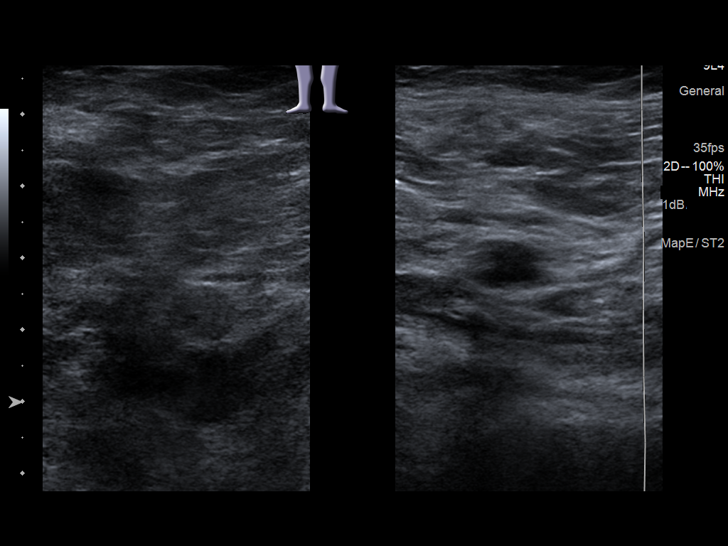
[im 4/40]
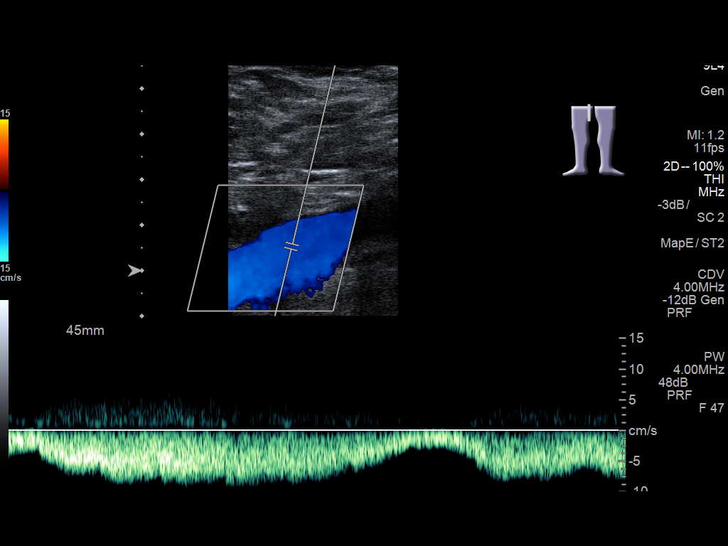
[im 7/40]
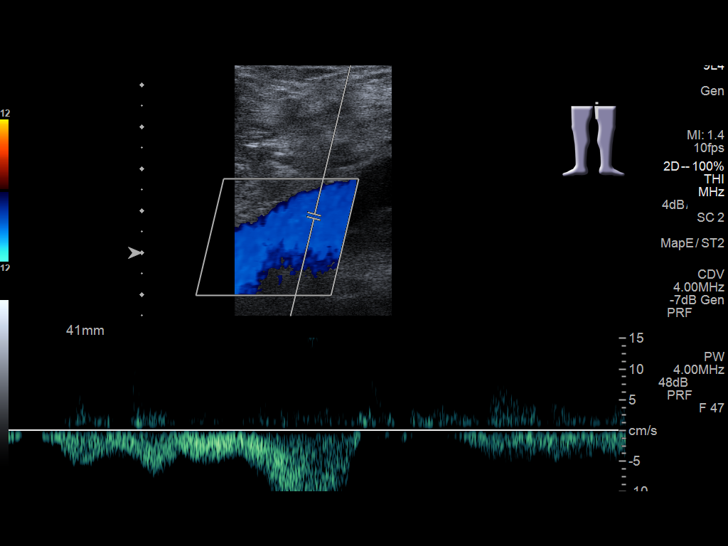
[im 11/40]
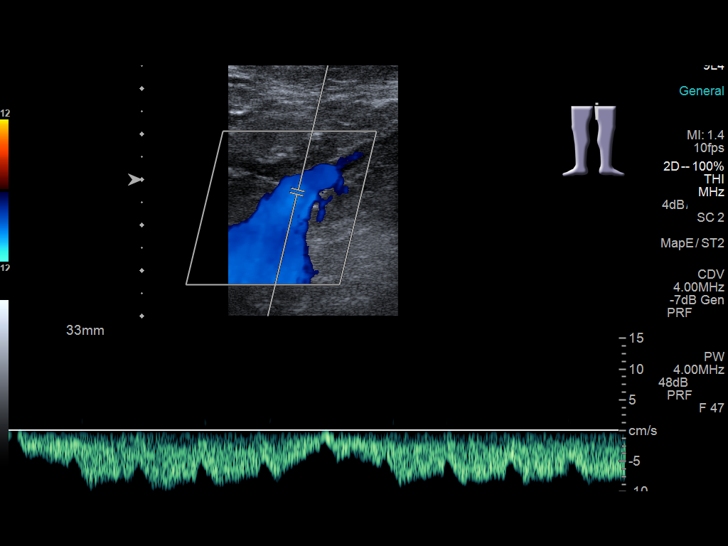
[im 14/40]
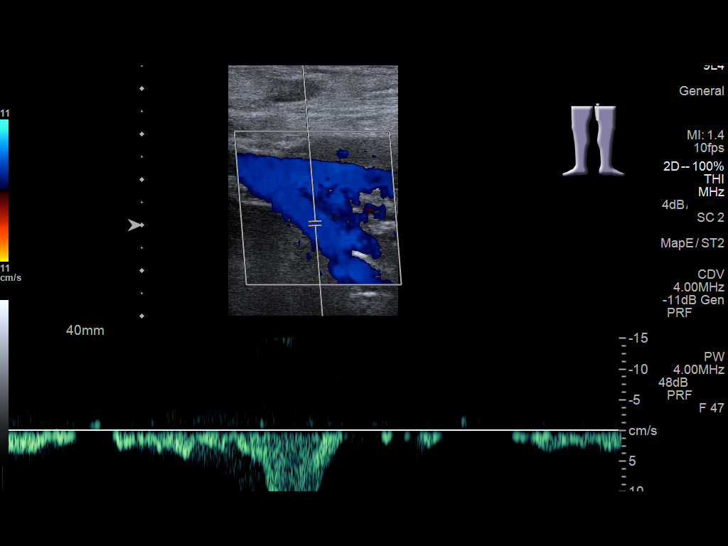
[im 17/40]
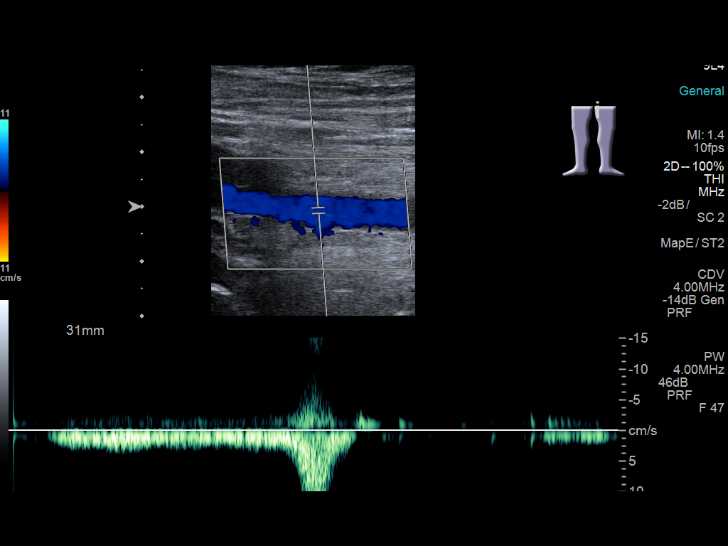
[im 21/40]
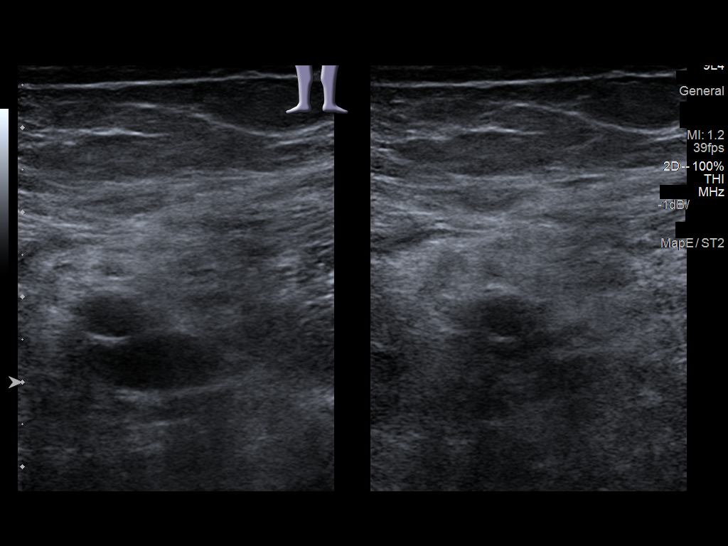
[im 23/40]
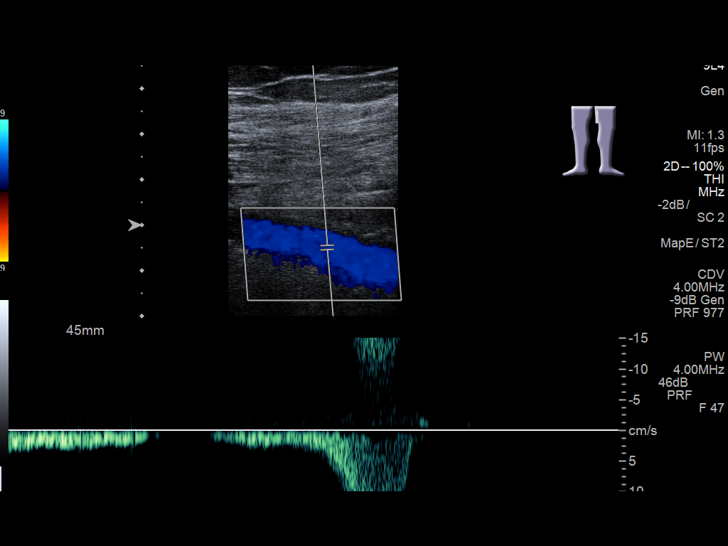
[im 26/40]
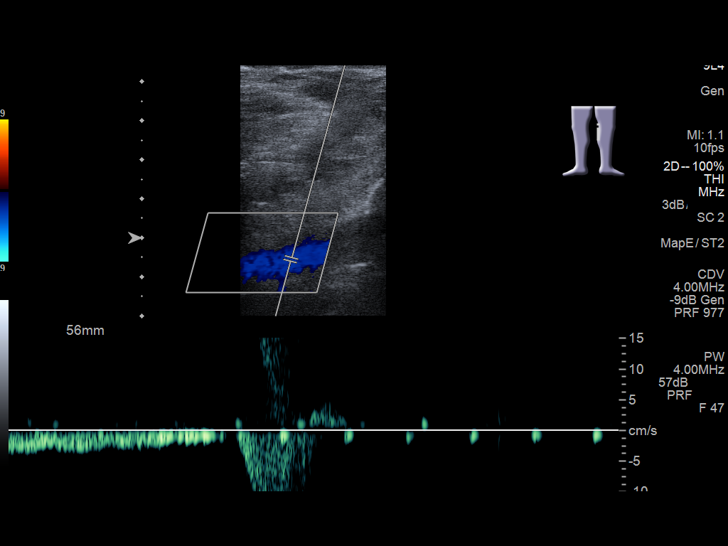
[im 29/40]
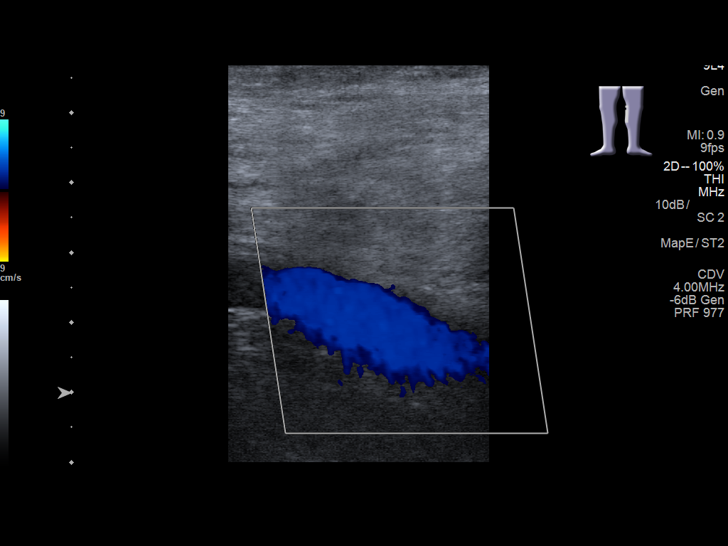
[im 33/40]
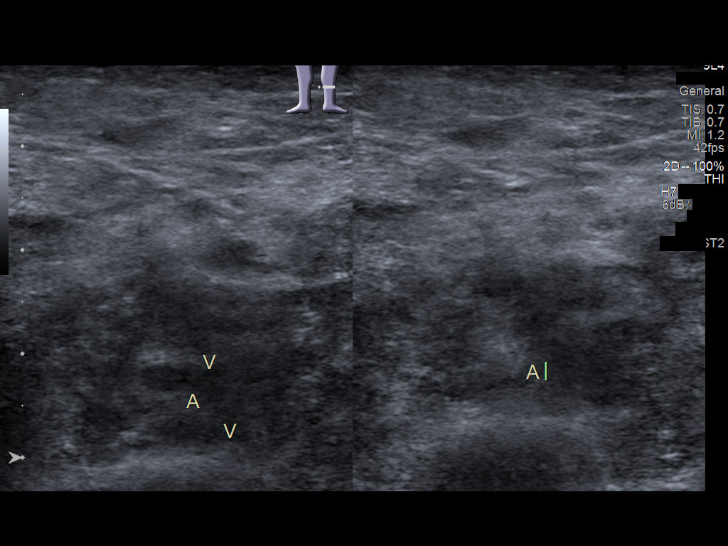
[im 36/40]
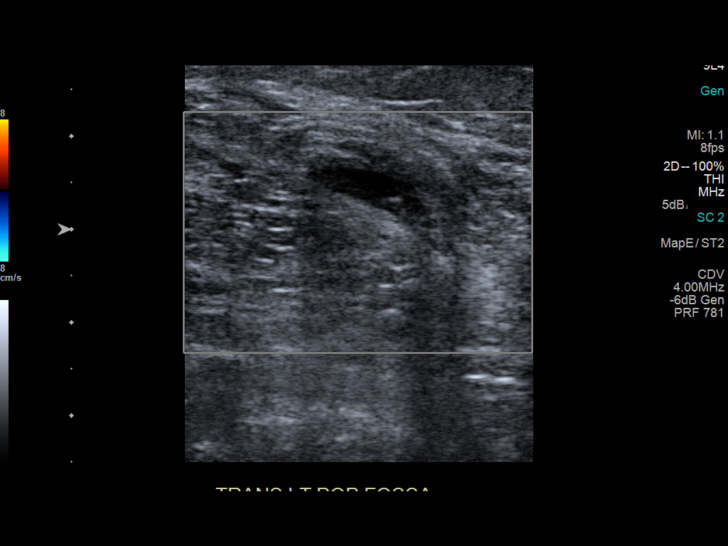
[im 40/40]
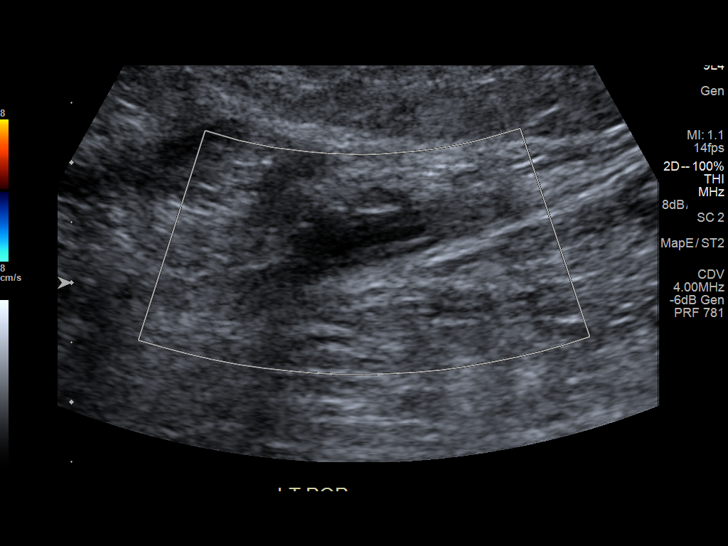

[13 of 24 positions shown; findings below may reference images not displayed]

FINDINGS: Contralateral Common Femoral Vein: Respiratory phasicity is normal
and symmetric with the symptomatic side. No evidence of thrombus.
Normal compressibility.

Common Femoral Vein: No evidence of thrombus. Normal
compressibility, respiratory phasicity and response to augmentation.

Saphenofemoral Junction: No evidence of thrombus. Normal
compressibility and flow on color Doppler imaging.

Profunda Femoral Vein: No evidence of thrombus. Normal
compressibility and flow on color Doppler imaging.

Femoral Vein: No evidence of thrombus. Normal compressibility,
respiratory phasicity and response to augmentation.

Popliteal Vein: No evidence of thrombus. Normal compressibility,
respiratory phasicity and response to augmentation.

Calf Veins: No evidence of thrombus. Normal compressibility and flow
on color Doppler imaging.

Superficial Great Saphenous Vein: No evidence of thrombus. Normal
compressibility and flow on color Doppler imaging.

Venous Reflux:  None.

Other Findings: Note is made of an approximately 1.3 x 1.1 x 0.5 cm
serpiginous fluid collection with the left popliteal fossa favored
to represent a tiny Baker cyst.
IMPRESSION: 1. No evidence of DVT within the left lower extremity.
2. Instilling noted tiny (approximately 1.3 cm) left-sided Baker's
cyst.

## 2023-08-14 ENCOUNTER — Emergency Department

## 2023-08-14 ENCOUNTER — Emergency Department
Admission: EM | Admit: 2023-08-14 | Discharge: 2023-08-14 | Disposition: A | Discharge status: Home / Self Care | Attending: Emergency Medicine | Admitting: Emergency Medicine

## 2023-08-14 ENCOUNTER — Encounter: Payer: Self-pay | Admitting: Emergency Medicine

## 2023-08-14 ENCOUNTER — Other Ambulatory Visit: Payer: Self-pay

## 2023-08-14 DIAGNOSIS — I10 Essential (primary) hypertension: Secondary | ICD-10-CM | POA: Insufficient documentation

## 2023-08-14 DIAGNOSIS — R0789 Other chest pain: Secondary | ICD-10-CM | POA: Diagnosis present

## 2023-08-14 LAB — BASIC METABOLIC PANEL WITH GFR
Anion gap: 11 (ref 5–15)
BUN: 31 mg/dL — ABNORMAL HIGH (ref 8–23)
CO2: 23 mmol/L (ref 22–32)
Calcium: 9.3 mg/dL (ref 8.9–10.3)
Chloride: 102 mmol/L (ref 98–111)
Creatinine, Ser: 1.88 mg/dL — ABNORMAL HIGH (ref 0.61–1.24)
GFR, Estimated: 39 mL/min — ABNORMAL LOW (ref >60)
Glucose, Bld: 155 mg/dL — ABNORMAL HIGH (ref 70–99)
Potassium: 4 mmol/L (ref 3.5–5.1)
Sodium: 136 mmol/L (ref 135–145)

## 2023-08-14 LAB — URINALYSIS, ROUTINE W REFLEX MICROSCOPIC
Bacteria, UA: NONE SEEN
Bilirubin Urine: NEGATIVE
Glucose, UA: >=500 mg/dL — AB
Hgb urine dipstick: NEGATIVE
Ketones, ur: NEGATIVE mg/dL
Leukocytes,Ua: NEGATIVE
Nitrite: NEGATIVE
Protein, ur: >=300 mg/dL — AB
Specific Gravity, Urine: 1.017 (ref 1.005–1.030)
pH: 6 (ref 5.0–8.0)

## 2023-08-14 LAB — CBC
HCT: 47.7 % (ref 39.0–52.0)
Hemoglobin: 16.4 g/dL (ref 13.0–17.0)
MCH: 30.3 pg (ref 26.0–34.0)
MCHC: 34.4 g/dL (ref 30.0–36.0)
MCV: 88 fL (ref 80.0–100.0)
Platelets: 236 10*3/uL (ref 150–400)
RBC: 5.42 MIL/uL (ref 4.22–5.81)
RDW: 13.2 % (ref 11.5–15.5)
WBC: 8.8 10*3/uL (ref 4.0–10.5)
nRBC: 0 % (ref 0.0–0.2)

## 2023-08-14 LAB — TROPONIN I (HIGH SENSITIVITY)
Troponin I (High Sensitivity): 18 ng/L — ABNORMAL HIGH (ref <18)
Troponin I (High Sensitivity): 19 ng/L — ABNORMAL HIGH (ref <18)

## 2023-08-14 MED ORDER — LABETALOL HCL 5 MG/ML IV SOLN
20.0000 mg | Freq: Once | INTRAVENOUS | Status: AC
  Administered 2023-08-14: 20 mg via INTRAVENOUS
  Filled 2023-08-14: qty 4

## 2023-08-14 MED ORDER — IOHEXOL 350 MG/ML SOLN
75.0000 mL | Freq: Once | INTRAVENOUS | Status: AC | PRN
Start: 2023-08-14 — End: 2023-08-14
  Administered 2023-08-14: 75 mL via INTRAVENOUS

## 2023-08-14 MED ORDER — AMLODIPINE BESYLATE 5 MG PO TABS: 5.0000 mg | ORAL_TABLET | Freq: Every day | ORAL | 3 refills | Status: DC

## 2023-08-14 NOTE — ED Triage Notes (Signed)
 Patient to ED via POV for left sided CP. Ongoing x1 month. States a stabbing pain. Hx HTN- not taking meds for the past year.

## 2023-08-14 NOTE — ED Provider Notes (Signed)
 [facility] Provider Note    Event Date/Time   First MD Initiated Contact with Patient 08/14/23 1053     (approximate)   History   Chest Pain   HPI  Richard Hunt is a 63 y.o. male who presents with several months worth of chest discomfort which he reports is nearly chronic.  He reports occasionally travels to his back as well.  He reports a history of high blood pressure but is out of blood pressure medications.  No shortness of breath reported.     Physical Exam   Triage Vital Signs: ED Triage Vitals  Encounter Vitals Group     BP 08/14/23 1043 (!) 224/114     Systolic BP Percentile --      Diastolic BP Percentile --      Pulse Rate 08/14/23 1043 85     Resp 08/14/23 1043 18     Temp 08/14/23 1043 98.3 F (36.8 C)     Temp Source 08/14/23 1043 Oral     SpO2 08/14/23 1043 100 %     Weight 08/14/23 1042 97.1 kg (214 lb)     Height 08/14/23 1042 1.651 m (5\' 5" )     Head Circumference --      Peak Flow --      Pain Score 08/14/23 1042 5     Pain Loc --      Pain Education --      Exclude from Growth Chart --     Most recent vital signs: Vitals:   08/14/23 1330 08/14/23 1428  BP: (!) 153/88   Pulse: 68   Resp: 20   Temp:  98.2 F (36.8 C)  SpO2: 99%      General: Awake, no distress.  CV:  Good peripheral perfusion.  Regular rate and rhythm Resp:  Normal effort.  Clear to auscultation bilaterally Abd:  No distention.  Other:  No calf pain or swelling   ED Results / Procedures / Treatments   Labs (all labs ordered are listed, but only abnormal results are displayed) Labs Reviewed  BASIC METABOLIC PANEL WITH GFR - Abnormal; Notable for the following components:      Result Value   Glucose, Bld 155 (*)    BUN 31 (*)    Creatinine, Ser 1.88 (*)    GFR, Estimated 39 (*)    All other components within normal limits  URINALYSIS, ROUTINE W REFLEX MICROSCOPIC - Abnormal; Notable for the following components:   Color,  Urine STRAW (*)    APPearance CLEAR (*)    Glucose, UA >=500 (*)    Protein, ur >=300 (*)    All other components within normal limits  TROPONIN I (HIGH SENSITIVITY) - Abnormal; Notable for the following components:   Troponin I (High Sensitivity) 18 (*)    All other components within normal limits  TROPONIN I (HIGH SENSITIVITY) - Abnormal; Notable for the following components:   Troponin I (High Sensitivity) 19 (*)    All other components within normal limits  CBC     EKG  ED ECG REPORT I, Bryson Carbine, the attending physician, personally viewed and interpreted this ECG.  Date: 08/14/2023  Rhythm: normal sinus rhythm QRS Axis: Left axis deviation Intervals: normal ST/T Wave abnormalities: normal Narrative Interpretation: no evidence of acute ischemia    RADIOLOGY Chest x-ray without acute abnormality viewed interpret by me    PROCEDURES:  Critical Care performed:   Procedures   MEDICATIONS ORDERED IN ED:  Medications  labetalol (NORMODYNE) injection 20 mg (20 mg Intravenous Given 08/14/23 1209)  iohexol (OMNIPAQUE) 350 MG/ML injection 75 mL (75 mLs Intravenous Contrast Given 08/14/23 1245)     IMPRESSION / MDM / ASSESSMENT AND PLAN / ED COURSE  I reviewed the triage vital signs and the nursing notes. Patient's presentation is most consistent with acute presentation with potential threat to life or bodily function.  Patient presents with chest pain, he is markedly hypertensive with mild tachycardia  Some chronicity to his problems but he does report radiation to his back.  Impression includes ACS, hypertensive emergency, acute aortic syndrome  EKG, high sensitive troponin are unchanged x 2, mildly elevated likely secondary to chronic hypertension.  Sent for CT angiography which is negative for acute aortic syndrome.  Patient's blood pressure improved significantly with IV labetalol  Will start the patient on amlodipine, refer to cardiology for further  evaluation, no indication for admission at this time        FINAL CLINICAL IMPRESSION(S) / ED DIAGNOSES   Final diagnoses:  Atypical chest pain  Uncontrolled hypertension     Rx / DC Orders   ED Discharge Orders          Ordered    Ambulatory referral to Cardiology       Comments: If you have not heard from the Cardiology office within the next 72 hours please call [phone].   08/14/23 1455    amLODipine (NORVASC) 5 MG tablet  Daily        08/14/23 1456             Note:  This document was prepared using Dragon voice recognition software and may include unintentional dictation errors.   Bryson Carbine, MD 08/14/23 1539

## 2023-09-14 ENCOUNTER — Ambulatory Visit: Admitting: Cardiology

## 2023-10-20 ENCOUNTER — Emergency Department

## 2023-10-20 ENCOUNTER — Inpatient Hospital Stay

## 2023-10-20 ENCOUNTER — Inpatient Hospital Stay
Admission: EM | Admit: 2023-10-20 | Discharge: 2023-10-26 | DRG: 065 | Disposition: A | Discharge status: Home / Self Care | Attending: Internal Medicine | Admitting: Internal Medicine

## 2023-10-20 ENCOUNTER — Other Ambulatory Visit: Payer: Self-pay

## 2023-10-20 DIAGNOSIS — Z7982 Long term (current) use of aspirin: Secondary | ICD-10-CM | POA: Diagnosis not present

## 2023-10-20 DIAGNOSIS — R29700 NIHSS score 0: Secondary | ICD-10-CM | POA: Diagnosis present

## 2023-10-20 DIAGNOSIS — Q613 Polycystic kidney, unspecified: Secondary | ICD-10-CM | POA: Diagnosis not present

## 2023-10-20 DIAGNOSIS — D631 Anemia in chronic kidney disease: Secondary | ICD-10-CM | POA: Diagnosis present

## 2023-10-20 DIAGNOSIS — I10 Essential (primary) hypertension: Secondary | ICD-10-CM | POA: Diagnosis present

## 2023-10-20 DIAGNOSIS — Z8673 Personal history of transient ischemic attack (TIA), and cerebral infarction without residual deficits: Secondary | ICD-10-CM | POA: Diagnosis not present

## 2023-10-20 DIAGNOSIS — E1165 Type 2 diabetes mellitus with hyperglycemia: Secondary | ICD-10-CM | POA: Diagnosis present

## 2023-10-20 DIAGNOSIS — E119 Type 2 diabetes mellitus without complications: Secondary | ICD-10-CM

## 2023-10-20 DIAGNOSIS — E1122 Type 2 diabetes mellitus with diabetic chronic kidney disease: Secondary | ICD-10-CM | POA: Diagnosis present

## 2023-10-20 DIAGNOSIS — Z6835 Body mass index (BMI) 35.0-35.9, adult: Secondary | ICD-10-CM

## 2023-10-20 DIAGNOSIS — Z79899 Other long term (current) drug therapy: Secondary | ICD-10-CM | POA: Diagnosis not present

## 2023-10-20 DIAGNOSIS — E7800 Pure hypercholesterolemia, unspecified: Secondary | ICD-10-CM | POA: Diagnosis present

## 2023-10-20 DIAGNOSIS — G8194 Hemiplegia, unspecified affecting left nondominant side: Secondary | ICD-10-CM | POA: Diagnosis present

## 2023-10-20 DIAGNOSIS — Z7984 Long term (current) use of oral hypoglycemic drugs: Secondary | ICD-10-CM | POA: Diagnosis not present

## 2023-10-20 DIAGNOSIS — I6340 Cerebral infarction due to embolism of unspecified cerebral artery: Principal | ICD-10-CM | POA: Diagnosis present

## 2023-10-20 DIAGNOSIS — Z5941 Food insecurity: Secondary | ICD-10-CM

## 2023-10-20 DIAGNOSIS — R519 Headache, unspecified: Secondary | ICD-10-CM | POA: Diagnosis not present

## 2023-10-20 DIAGNOSIS — Z7902 Long term (current) use of antithrombotics/antiplatelets: Secondary | ICD-10-CM | POA: Diagnosis not present

## 2023-10-20 DIAGNOSIS — N1832 Chronic kidney disease, stage 3b: Secondary | ICD-10-CM | POA: Diagnosis present

## 2023-10-20 DIAGNOSIS — Z833 Family history of diabetes mellitus: Secondary | ICD-10-CM

## 2023-10-20 DIAGNOSIS — Z5986 Financial insecurity: Secondary | ICD-10-CM | POA: Diagnosis not present

## 2023-10-20 DIAGNOSIS — I160 Hypertensive urgency: Secondary | ICD-10-CM | POA: Diagnosis present

## 2023-10-20 DIAGNOSIS — R29703 NIHSS score 3: Secondary | ICD-10-CM | POA: Diagnosis not present

## 2023-10-20 DIAGNOSIS — I1A0 Resistant hypertension: Secondary | ICD-10-CM | POA: Diagnosis not present

## 2023-10-20 DIAGNOSIS — N179 Acute kidney failure, unspecified: Secondary | ICD-10-CM | POA: Diagnosis present

## 2023-10-20 DIAGNOSIS — E785 Hyperlipidemia, unspecified: Secondary | ICD-10-CM | POA: Diagnosis present

## 2023-10-20 DIAGNOSIS — E66812 Obesity, class 2: Secondary | ICD-10-CM | POA: Diagnosis present

## 2023-10-20 DIAGNOSIS — I129 Hypertensive chronic kidney disease with stage 1 through stage 4 chronic kidney disease, or unspecified chronic kidney disease: Secondary | ICD-10-CM | POA: Diagnosis present

## 2023-10-20 DIAGNOSIS — K219 Gastro-esophageal reflux disease without esophagitis: Secondary | ICD-10-CM | POA: Diagnosis present

## 2023-10-20 DIAGNOSIS — I639 Cerebral infarction, unspecified: Principal | ICD-10-CM | POA: Diagnosis present

## 2023-10-20 DIAGNOSIS — G450 Vertebro-basilar artery syndrome: Secondary | ICD-10-CM | POA: Diagnosis present

## 2023-10-20 DIAGNOSIS — R700 Elevated erythrocyte sedimentation rate: Secondary | ICD-10-CM | POA: Diagnosis not present

## 2023-10-20 DIAGNOSIS — I7090 Unspecified atherosclerosis: Secondary | ICD-10-CM | POA: Diagnosis not present

## 2023-10-20 DIAGNOSIS — I6521 Occlusion and stenosis of right carotid artery: Secondary | ICD-10-CM

## 2023-10-20 LAB — BASIC METABOLIC PANEL WITH GFR
Anion gap: 9 (ref 5–15)
BUN: 34 mg/dL — ABNORMAL HIGH (ref 8–23)
CO2: 23 mmol/L (ref 22–32)
Calcium: 8.7 mg/dL — ABNORMAL LOW (ref 8.9–10.3)
Chloride: 108 mmol/L (ref 98–111)
Creatinine, Ser: 2.22 mg/dL — ABNORMAL HIGH (ref 0.61–1.24)
GFR, Estimated: 32 mL/min — ABNORMAL LOW (ref >60)
Glucose, Bld: 129 mg/dL — ABNORMAL HIGH (ref 70–99)
Potassium: 3.9 mmol/L (ref 3.5–5.1)
Sodium: 140 mmol/L (ref 135–145)

## 2023-10-20 LAB — CBC WITH DIFFERENTIAL/PLATELET
Abs Immature Granulocytes: 0.11 K/uL — ABNORMAL HIGH (ref 0.00–0.07)
Basophils Absolute: 0.1 K/uL (ref 0.0–0.1)
Basophils Relative: 1 %
Eosinophils Absolute: 0.3 K/uL (ref 0.0–0.5)
Eosinophils Relative: 3 %
HCT: 34.7 % — ABNORMAL LOW (ref 39.0–52.0)
Hemoglobin: 12.4 g/dL — ABNORMAL LOW (ref 13.0–17.0)
Immature Granulocytes: 1 %
Lymphocytes Relative: 19 %
Lymphs Abs: 1.9 K/uL (ref 0.7–4.0)
MCH: 31.1 pg (ref 26.0–34.0)
MCHC: 35.7 g/dL (ref 30.0–36.0)
MCV: 87 fL (ref 80.0–100.0)
Monocytes Absolute: 0.7 K/uL (ref 0.1–1.0)
Monocytes Relative: 7 %
Neutro Abs: 7 K/uL (ref 1.7–7.7)
Neutrophils Relative %: 69 %
Platelets: 235 K/uL (ref 150–400)
RBC: 3.99 MIL/uL — ABNORMAL LOW (ref 4.22–5.81)
RDW: 12.3 % (ref 11.5–15.5)
WBC: 10 K/uL (ref 4.0–10.5)
nRBC: 0 % (ref 0.0–0.2)

## 2023-10-20 LAB — URINE DRUG SCREEN, QUALITATIVE (ARMC ONLY)
Amphetamines, Ur Screen: NOT DETECTED
Barbiturates, Ur Screen: NOT DETECTED
Benzodiazepine, Ur Scrn: NOT DETECTED
Cannabinoid 50 Ng, Ur ~~LOC~~: NOT DETECTED
Cocaine Metabolite,Ur ~~LOC~~: NOT DETECTED
MDMA (Ecstasy)Ur Screen: NOT DETECTED
Methadone Scn, Ur: NOT DETECTED
Opiate, Ur Screen: NOT DETECTED
Phencyclidine (PCP) Ur S: NOT DETECTED
Tricyclic, Ur Screen: NOT DETECTED

## 2023-10-20 LAB — PROTIME-INR
INR: 1 (ref 0.8–1.2)
Prothrombin Time: 13.3 s (ref 11.4–15.2)

## 2023-10-20 LAB — APTT: aPTT: 29 s (ref 24–36)

## 2023-10-20 LAB — GLUCOSE, CAPILLARY: Glucose-Capillary: 182 mg/dL — ABNORMAL HIGH (ref 70–99)

## 2023-10-20 LAB — HEMOGLOBIN A1C
Hgb A1c MFr Bld: 6.1 % — ABNORMAL HIGH (ref 4.8–5.6)
Mean Plasma Glucose: 128.37 mg/dL

## 2023-10-20 MED ORDER — CLOPIDOGREL BISULFATE 75 MG PO TABS
75.0000 mg | ORAL_TABLET | Freq: Every day | ORAL | Status: DC
  Administered 2023-10-20 – 2023-10-26 (×7): 75 mg via ORAL
  Filled 2023-10-20 (×7): qty 1

## 2023-10-20 MED ORDER — ORAL CARE MOUTH RINSE: 15.0000 mL | OROMUCOSAL | Status: DC | PRN

## 2023-10-20 MED ORDER — ACETAMINOPHEN 160 MG/5ML PO SOLN: 650.0000 mg | ORAL | Status: DC | PRN

## 2023-10-20 MED ORDER — SODIUM CHLORIDE 0.9 % IV SOLN: INTRAVENOUS | Status: DC

## 2023-10-20 MED ORDER — GADOBUTROL 1 MMOL/ML IV SOLN
10.0000 mL | Freq: Once | INTRAVENOUS | Status: AC | PRN
  Administered 2023-10-20: 10 mL via INTRAVENOUS

## 2023-10-20 MED ORDER — ACETAMINOPHEN 650 MG RE SUPP: 650.0000 mg | RECTAL | Status: DC | PRN

## 2023-10-20 MED ORDER — SENNOSIDES-DOCUSATE SODIUM 8.6-50 MG PO TABS
1.0000 | ORAL_TABLET | Freq: Every evening | ORAL | Status: DC | PRN
Start: 2023-10-20 — End: 2023-10-26
  Administered 2023-10-25: 1 via ORAL
  Filled 2023-10-20: qty 1

## 2023-10-20 MED ORDER — DIPHENHYDRAMINE HCL 50 MG/ML IJ SOLN
25.0000 mg | Freq: Once | INTRAMUSCULAR | Status: AC
  Administered 2023-10-20: 25 mg via INTRAVENOUS
  Filled 2023-10-20: qty 1

## 2023-10-20 MED ORDER — PANTOPRAZOLE SODIUM 40 MG PO TBEC
40.0000 mg | DELAYED_RELEASE_TABLET | Freq: Every day | ORAL | Status: DC
  Administered 2023-10-20 – 2023-10-26 (×7): 40 mg via ORAL
  Filled 2023-10-20 (×7): qty 1

## 2023-10-20 MED ORDER — ASPIRIN 81 MG PO CHEW
324.0000 mg | CHEWABLE_TABLET | Freq: Once | ORAL | Status: AC
  Administered 2023-10-20: 324 mg via ORAL
  Filled 2023-10-20: qty 4

## 2023-10-20 MED ORDER — STROKE: EARLY STAGES OF RECOVERY BOOK: Freq: Once | Status: AC

## 2023-10-20 MED ORDER — PROCHLORPERAZINE EDISYLATE 10 MG/2ML IJ SOLN
10.0000 mg | Freq: Once | INTRAMUSCULAR | Status: AC
  Administered 2023-10-20: 10 mg via INTRAVENOUS
  Filled 2023-10-20: qty 2

## 2023-10-20 MED ORDER — ASPIRIN 81 MG PO CHEW
81.0000 mg | CHEWABLE_TABLET | Freq: Every day | ORAL | Status: DC
  Administered 2023-10-21 – 2023-10-26 (×6): 81 mg via ORAL
  Filled 2023-10-20 (×6): qty 1

## 2023-10-20 MED ORDER — LORAZEPAM 0.5 MG PO TABS: 0.5000 mg | ORAL_TABLET | Freq: Once | ORAL | Status: DC | PRN

## 2023-10-20 MED ORDER — HEPARIN SODIUM (PORCINE) 5000 UNIT/ML IJ SOLN
5000.0000 [IU] | Freq: Three times a day (TID) | INTRAMUSCULAR | Status: DC
  Administered 2023-10-20 – 2023-10-26 (×18): 5000 [IU] via SUBCUTANEOUS
  Filled 2023-10-20 (×18): qty 1

## 2023-10-20 MED ORDER — ACETAMINOPHEN 325 MG PO TABS
650.0000 mg | ORAL_TABLET | ORAL | Status: DC | PRN
  Administered 2023-10-21 – 2023-10-24 (×4): 650 mg via ORAL
  Filled 2023-10-20 (×4): qty 2

## 2023-10-20 MED ORDER — INSULIN ASPART 100 UNIT/ML IJ SOLN
0.0000 [IU] | Freq: Three times a day (TID) | INTRAMUSCULAR | Status: DC
  Administered 2023-10-21: 5 [IU] via SUBCUTANEOUS
  Administered 2023-10-22: 3 [IU] via SUBCUTANEOUS
  Administered 2023-10-22 – 2023-10-23 (×3): 2 [IU] via SUBCUTANEOUS
  Administered 2023-10-24: 3 [IU] via SUBCUTANEOUS
  Administered 2023-10-24 – 2023-10-25 (×3): 2 [IU] via SUBCUTANEOUS
  Administered 2023-10-26: 3 [IU] via SUBCUTANEOUS
  Filled 2023-10-20 (×13): qty 1

## 2023-10-20 NOTE — Assessment & Plan Note (Signed)
 The patient does not appear to be on any antihyperglycemic medications at home. Will check a hemoglobin A1c to verify diagnosis.

## 2023-10-20 NOTE — Assessment & Plan Note (Signed)
PPI daily

## 2023-10-20 NOTE — Assessment & Plan Note (Signed)
 The patient takes Zetia 10 mg daily at home. This will be continued and lipid panel will be ordered for the am.

## 2023-10-20 NOTE — ED Provider Notes (Signed)
 [facility] Provider Note    Event Date/Time   First MD Initiated Contact with Patient 10/20/23 1205     (approximate)   History   Chief Complaint Hypertension   HPI  Richard Hunt is a 63 y.o. male with past medical history of hypertension, hyperlipidemia, diabetes, and venous insufficiency who presents to the ED complaining of hypertension.  Patient reports that his blood pressure has been running high for about the past 3 months and he has been dealing with increasing headache during this time.  He describes throbbing pain to the back of his head that has been getting worse and present constantly over the past couple of days.  He currently takes amlodipine , hydralazine , and hydrochlorothiazide  and denies any missed doses of his medication, but has not yet taken his medication today.  He denies any vision changes, speech changes, numbness, or weakness but has been feeling dizzy at times.     Physical Exam   Triage Vital Signs: ED Triage Vitals  Encounter Vitals Group     BP 10/20/23 1145 (!) 197/104     Girls Systolic BP Percentile --      Girls Diastolic BP Percentile --      Boys Systolic BP Percentile --      Boys Diastolic BP Percentile --      Pulse Rate 10/20/23 1145 87     Resp 10/20/23 1145 18     Temp 10/20/23 1147 97.7 F (36.5 C)     Temp Source 10/20/23 1147 Oral     SpO2 10/20/23 1145 100 %     Weight 10/20/23 1146 214 lb 1.1 oz (97.1 kg)     Height 10/20/23 1146 5' 5 (1.651 m)     Head Circumference --      Peak Flow --      Pain Score 10/20/23 1146 9     Pain Loc --      Pain Education --      Exclude from Growth Chart --     Most recent vital signs: Vitals:   10/20/23 1530 10/20/23 1600  BP: (!) 173/85 (!) 185/89  Pulse: 84 80  Resp: 20 20  Temp: 97.7 F (36.5 C)   SpO2: 100% 100%    Constitutional: Alert and oriented. Eyes: Conjunctivae are normal. Head: Atraumatic. Nose: No  congestion/rhinnorhea. Mouth/Throat: Mucous membranes are moist.  Cardiovascular: Normal rate, regular rhythm. Grossly normal heart sounds.  2+ radial pulses bilaterally. Respiratory: Normal respiratory effort.  No retractions. Lungs CTAB. Gastrointestinal: Soft and nontender. No distention. Musculoskeletal: No lower extremity tenderness nor edema.  Neurologic:  Normal speech and language. No gross focal neurologic deficits are appreciated.    ED Results / Procedures / Treatments   Labs (all labs ordered are listed, but only abnormal results are displayed) Labs Reviewed  CBC WITH DIFFERENTIAL/PLATELET - Abnormal; Notable for the following components:      Result Value   RBC 3.99 (*)    Hemoglobin 12.4 (*)    HCT 34.7 (*)    Abs Immature Granulocytes 0.11 (*)    All other components within normal limits  BASIC METABOLIC PANEL WITH GFR - Abnormal; Notable for the following components:   Glucose, Bld 129 (*)    BUN 34 (*)    Creatinine, Ser 2.22 (*)    Calcium  8.7 (*)    GFR, Estimated 32 (*)    All other components within normal limits  PROTIME-INR  APTT  URINE DRUG SCREEN,  QUALITATIVE ([facility] ONLY)     EKG  ED ECG REPORT I, Carlin Palin, the attending physician, personally viewed and interpreted this ECG.   Date: 10/20/2023  EKG Time: 11:48  Rate: 88  Rhythm: normal sinus rhythm  Axis: Normal  Intervals:none  ST&T Change: None  RADIOLOGY CT head reviewed and interpreted by me with no hemorrhage or midline shift.  PROCEDURES:  Critical Care performed: No  Procedures   MEDICATIONS ORDERED IN ED: Medications  prochlorperazine  (COMPAZINE ) injection 10 mg (10 mg Intravenous Given 10/20/23 1305)  diphenhydrAMINE  (BENADRYL ) injection 25 mg (25 mg Intravenous Given 10/20/23 1305)  aspirin  chewable tablet 324 mg (324 mg Oral Given 10/20/23 1541)     IMPRESSION / MDM / ASSESSMENT AND PLAN / ED COURSE  I reviewed the triage vital signs and the nursing notes.                               63 y.o. male with past medical history of hypertension, hyperlipidemia, diabetes, and venous insufficiency who presents to the ED with 3 months of worsening headache and elevated blood pressure.  Patient's presentation is most consistent with acute presentation with potential threat to life or bodily function.  Differential diagnosis includes, but is not limited to, SAH, meningitis, tension headache, migraine headache, stroke, TIA, anemia, electrolyte abnormality, AKI.  Patient nontoxic-appearing and in no acute distress, vital signs remarkable for hypertension with BP of 197/104.  He describes occasional dizziness but does not seem to have any focal neurologic deficits on exam, CT head concerning for age-indeterminate infarct in the right cerebellar hemisphere, will further assess with MRI.  Lab results are pending at this time, will treat symptomatically with IV Compazine  and Benadryl  for his headache.  We will hold off on any medication for his blood pressure given need for permissive hypertension if patient found to have acute stroke.  MRI brain is concerning for patchy acute/subacute infarcts scattered within the callosal splenium on the left and bilateral parieto-occipital lobes.  Coags are unremarkable, patient given loading dose of aspirin .  Case discussed with hospitalist for admission for further stroke workup.      FINAL CLINICAL IMPRESSION(S) / ED DIAGNOSES   Final diagnoses:  Cerebrovascular accident (CVA), unspecified mechanism (HCC)     Rx / DC Orders   ED Discharge Orders     None        Note:  This document was prepared using Dragon voice recognition software and may include unintentional dictation errors.   Palin Carlin, MD 10/20/23 [phone]

## 2023-10-20 NOTE — H&P (Signed)
 History and Physical    Patient: Richard Hunt FMW:[id number] DOB: 1960-05-03 DOA: 10/20/2023 DOS: the patient was seen and examined on 10/20/2023 PCP: Cletus Glenn  Patient coming from: Home  Chief Complaint:  Chief Complaint  Patient presents with   Hypertension   HPI: Richard Hunt is a 63 y.o. male with medical history significant hypertension, hyperlipidemia, diabetes mellitus, and venous insufficiency. The patient states that he presented to [facility] ED with complaints of sever and chronic headache that has been ongoing for about 2 months. He also states that he has had intermittent dizziness during this time. The patient states that he has 2 or 3 blood pressure medications that he states that he takes daily, but his blood rpessure is always high. He states that he does not know what these medications are. Records in epic suggest that he has been on metoprolol succinate 100 mg daily, amlodipine  10 mg daily, and losartan/hydrochlorothiazide  daily. He is unable to confirm these medications. His family states that at times his speech has been difficulty to understand. However, neither the patient or his family is able to identify any new specific complaint that caused him to come to the ED today.   Upon arrival in the ED today the patient's blood pressure was 197/140. The patient stated that he felt very bad and had a headache. He was found to have an AKI with creatinine of 2.2 ( increased from a level of 1.88 on 08/14/2023. An MRI brain was obtained that demonstrated a patchy small acute/subacute infarcts scattered within the callosal splenium on the left and within the bilateral parieto-occipital lobes. There is also background age-advanced chronic small vessel ischemic disease with multiple chronic lacunar infarcts. There were a few nonspedific chronic microhemorrhages within the supratentorial brain. The patietn is also found to have an asymmetrically dimuinutive right  internal carotid artery suspicious for the presence of a non-visualized upstream stenosis within the neck. MR angiography has been order.  Review of Systems: As mentioned in the history of present illness. All other systems reviewed and are negative. Past Medical History:  Diagnosis Date   Acute cholecystitis 12/11/2016   Acute kidney injury (nontraumatic) (HCC)    Bilateral edema of lower extremity 01/02/2014   Chronic venous insufficiency 01/16/2014   Diabetes (HCC) 12/11/2016   Diabetes mellitus without complication (HCC)    GERD (gastroesophageal reflux disease)    HLD (hyperlipidemia)    HTN (hypertension) 12/11/2016   Hypercholesterolemia    Hypertension    Osteoarthritis    Polycystic kidney disease    Ulcer of calf (HCC) 08/29/2013   Past Surgical History:  Procedure Laterality Date   CHOLECYSTECTOMY N/A 12/11/2016   Procedure: LAPAROSCOPIC CHOLECYSTECTOMY;  Surgeon: Shelva Dunnings, MD;  Location: [facility] ORS;  Service: General;  Laterality: N/A;   VEIN SURGERY     Social History:  reports that he has never smoked. He has never used smokeless tobacco. He reports that he does not drink alcohol and does not use drugs.  No Known Allergies  Family History  Problem Relation Age of Onset   Diabetes Mother     Prior to Admission medications   Medication Sig Start Date End Date Taking? Authorizing Provider  amLODipine  (NORVASC ) 5 MG tablet Take 1 tablet (5 mg total) by mouth daily. 08/14/23 08/13/24  Arlander Charleston, MD  glipiZIDE  (GLUCOTROL ) 5 MG tablet Take 1 tablet (5 mg total) by mouth 2 (two) times daily before a meal. 12/13/16 01/12/17  Desiderio Schanz, MD  meloxicam (MOBIC) 15  MG tablet Take 1 tablet by mouth daily. 12/19/16   [provider]  ondansetron  (ZOFRAN -ODT) 4 MG disintegrating tablet DISOLVER UNA TABLETA EN LA LENGUA CADA OCHO HORAS SEGUN SEA NECESARIO PARA NAUSEAS Y VOMITOS 12/11/16   [provider]    Physical Exam: Vitals:   10/20/23 1645 10/20/23  1915 10/20/23 1915 10/20/23 1930  BP:  (!) 180/80 (!) 180/80 (!) 170/81  Pulse: 75 79  78  Resp: 14 18  13   Temp:   98.1 F (36.7 C)   TempSrc:   Oral   SpO2: 100% 98%  99%  Weight:      Height:       Exam:  Constitutional:  The patient is awake, alert, and oriented x 3. Mild distress due to cephaglia. Eyes:  pupils and irises appear normal Normal lids and conjunctivae ENMT:  grossly normal hearing  Lips appear normal external ears, nose appear normal Oropharynx: mucosa, tongue,posterior pharynx appear normal Neck:  neck appears normal, no masses, normal ROM, supple no thyromegaly Respiratory:  No increased work of breathing. No wheezes, rales, or rhonchi No tactile fremitus Cardiovascular:  Regular rate and rhythm No murmurs, ectopy, or gallups. No lateral PMI. No thrills. Abdomen:  Abdomen is soft, non-tender, non-distended No hernias, masses, or organomegaly Normoactive bowel sounds.  Musculoskeletal:  No cyanosis, clubbing, or edema Skin:  No rashes, lesions, ulcers palpation of skin: no induration or nodules Neurologic:  CN 2-12 intact Sensation all 4 extremities intact Psychiatric:  Mental status Mood, affect appropriate Orientation to person, place, time  judgment and insight appear intact  Data Reviewed:  CBC BMP Troponin MRI Brain.  Assessment and Plan: Diabetes [facility]) The patient does not appear to be on any antihyperglycemic medications at home. Will check a hemoglobin A1c to verify diagnosis.  HLD (hyperlipidemia) The patient takes Zetia 10 mg daily at home. This will be continued and lipid panel will be ordered for the am.  HTN (hypertension) Prior to presentation the patient's blood pressure was controlled with amlodipine  5 mg daily, hydrochlorothiazide  12.5 mg, and telmisartan 40 mg daily. These will be held currently as a permissive approach to hypertension will be taken in the setting of an acute/subacute CVA.  CVA (cerebral  vascular accident) [facility]) The patient presented with uncontrolled hypertension and complaints of dizziness. CT brain demonstrated a small age-indeterminate infarct within the right cerebellar hemisphere. There were also moderate chronic small vessel ischemic changes within the cerebral white matter. MRI brain demonstrated patchy small acute/subacute infarcts scattered within the callosal splenium on the left and within the bilateral parieto-occipital lobes. Background age-advanced chronic small vessel ischemic disease with multiple chronic lacunar infarcts. There were a few nonspecific chronic micro-hemorrhages within the supratentorial brain. There is also an asymmetrically diminutive right internal carotid artery at the imaged levels suspicious for the presence of a non-visualized upstream(more proximal) stenosis within the neck. Consider CT or MR angiography further evaluation.   The patient will be admitted to a telemetry bed. Echocardiogram has been ordered as has PT/OT and SLP. Neurology has been consulted. The patient will be started on daily ASA 81 mg and plavix  75 mg daily until their recommendations are available. A permissive approach will be used towards the patient's hypertension for the next 24-48 hours. IV labetalol  will be available for systolic pressures greater than 220.  GERD (gastroesophageal reflux disease) PPI daily.  I have seen and examined this patient myself. I have spent 62 minutes in the evaluation and admission of this patient.  Advance Care Planning:   Code Status: Full Code   Consults: Neurology. They will see the patient in the morning.  Family Communication: Family at bedside. All questions answered to the best of my ability.  Severity of Illness: The appropriate patient status for this patient is INPATIENT. Inpatient status is judged to be reasonable and necessary in order to provide the required intensity of service to ensure the patient's safety. The patient's  presenting symptoms, physical exam findings, and initial radiographic and laboratory data in the context of their chronic comorbidities is felt to place them at high risk for further clinical deterioration. Furthermore, it is not anticipated that the patient will be medically stable for discharge from the hospital within 2 midnights of admission.   * I certify that at the point of admission it is my clinical judgment that the patient will require inpatient hospital care spanning beyond 2 midnights from the point of admission due to high intensity of service, high risk for further deterioration and high frequency of surveillance required.*  Author: Jovanni Rash, DO 10/20/2023 7:34 PM  For on call review [email or web address].

## 2023-10-20 NOTE — Assessment & Plan Note (Addendum)
 Prior to presentation the patient's blood pressure was controlled with amlodipine  5 mg daily, hydrochlorothiazide  12.5 mg, and telmisartan 40 mg daily. These will be held currently as a permissive approach to hypertension will be taken in the setting of an acute/subacute CVA.

## 2023-10-20 NOTE — Assessment & Plan Note (Addendum)
 The patient presented with uncontrolled hypertension and complaints of dizziness. CT brain demonstrated a small age-indeterminate infarct within the right cerebellar hemisphere. There were also moderate chronic small vessel ischemic changes within the cerebral white matter. MRI brain demonstrated patchy small acute/subacute infarcts scattered within the callosal splenium on the left and within the bilateral parieto-occipital lobes. Background age-advanced chronic small vessel ischemic disease with multiple chronic lacunar infarcts. There were a few nonspecific chronic micro-hemorrhages within the supratentorial brain. There is also an asymmetrically diminutive right internal carotid artery at the imaged levels suspicious for the presence of a non-visualized upstream(more proximal) stenosis within the neck. Consider CT or MR angiography further evaluation.   The patient will be admitted to a telemetry bed. Echocardiogram has been ordered as has PT/OT and SLP. Neurology has been consulted. The patient will be started on daily ASA 81 mg and plavix  75 mg daily until their recommendations are available. A permissive approach will be used towards the patient's hypertension for the next 24-48 hours. IV labetalol  will be available for systolic pressures greater than 220.

## 2023-10-20 NOTE — ED Triage Notes (Addendum)
 Pt states HA, pt has HX of hypertension, pt hasn't taken BP meds today. Pt states Headache for 3 months.   Pt states he takes BP meds at 1300 daily after eating.

## 2023-10-21 ENCOUNTER — Inpatient Hospital Stay
Admit: 2023-10-21 | Discharge: 2023-10-21 | Disposition: A | Discharge status: Home / Self Care | Attending: Internal Medicine | Admitting: Internal Medicine

## 2023-10-21 ENCOUNTER — Inpatient Hospital Stay

## 2023-10-21 DIAGNOSIS — R29703 NIHSS score 3: Secondary | ICD-10-CM | POA: Diagnosis not present

## 2023-10-21 DIAGNOSIS — I7090 Unspecified atherosclerosis: Secondary | ICD-10-CM

## 2023-10-21 DIAGNOSIS — I639 Cerebral infarction, unspecified: Secondary | ICD-10-CM | POA: Diagnosis not present

## 2023-10-21 LAB — ECHOCARDIOGRAM COMPLETE
AR max vel: 2.33 cm2
AV Peak grad: 10.9 mmHg
Ao pk vel: 1.65 m/s
Area-P 1/2: 3.89 cm2
Height: 65 in
S' Lateral: 3.5 cm
Weight: 3425.07 [oz_av]

## 2023-10-21 LAB — LIPID PANEL
Cholesterol: 138 mg/dL (ref 0–200)
HDL: 41 mg/dL (ref >40)
LDL Cholesterol: 74 mg/dL (ref 0–99)
Total CHOL/HDL Ratio: 3.4 ratio
Triglycerides: 113 mg/dL (ref <150)
VLDL: 23 mg/dL (ref 0–40)

## 2023-10-21 LAB — GLUCOSE, CAPILLARY
Glucose-Capillary: 105 mg/dL — ABNORMAL HIGH (ref 70–99)
Glucose-Capillary: 195 mg/dL — ABNORMAL HIGH (ref 70–99)
Glucose-Capillary: 207 mg/dL — ABNORMAL HIGH (ref 70–99)
Glucose-Capillary: 81 mg/dL (ref 70–99)
Glucose-Capillary: 191 mg/dL — ABNORMAL HIGH (ref 70–99)

## 2023-10-21 LAB — C-REACTIVE PROTEIN: CRP: 0.8 mg/dL (ref <1.0)

## 2023-10-21 LAB — HIV ANTIBODY (ROUTINE TESTING W REFLEX): HIV Screen 4th Generation wRfx: NONREACTIVE

## 2023-10-21 LAB — SEDIMENTATION RATE: Sed Rate: 63 mm/h — ABNORMAL HIGH (ref 0–20)

## 2023-10-21 MED ORDER — AMLODIPINE BESYLATE 5 MG PO TABS
5.0000 mg | ORAL_TABLET | Freq: Every day | ORAL | Status: DC
  Administered 2023-10-21 – 2023-10-22 (×2): 5 mg via ORAL
  Filled 2023-10-21 (×2): qty 1

## 2023-10-21 MED ORDER — GABAPENTIN 100 MG PO CAPS
100.0000 mg | ORAL_CAPSULE | Freq: Every day | ORAL | Status: DC
  Administered 2023-10-21 – 2023-10-25 (×5): 100 mg via ORAL
  Filled 2023-10-21 (×5): qty 1

## 2023-10-21 MED ORDER — ATORVASTATIN CALCIUM 20 MG PO TABS
40.0000 mg | ORAL_TABLET | Freq: Every day | ORAL | Status: DC
  Administered 2023-10-21: 40 mg via ORAL
  Filled 2023-10-21: qty 2

## 2023-10-21 MED ORDER — TRAMADOL HCL 50 MG PO TABS
50.0000 mg | ORAL_TABLET | Freq: Three times a day (TID) | ORAL | Status: DC | PRN
  Administered 2023-10-22 – 2023-10-25 (×5): 50 mg via ORAL
  Filled 2023-10-21 (×5): qty 1

## 2023-10-21 MED ORDER — HYDROCHLOROTHIAZIDE 25 MG PO TABS
25.0000 mg | ORAL_TABLET | Freq: Every day | ORAL | Status: DC
  Administered 2023-10-21 – 2023-10-26 (×6): 25 mg via ORAL
  Filled 2023-10-21 (×6): qty 1

## 2023-10-21 MED ORDER — ATORVASTATIN CALCIUM 80 MG PO TABS
80.0000 mg | ORAL_TABLET | Freq: Every day | ORAL | Status: DC
  Administered 2023-10-22: 80 mg via ORAL
  Filled 2023-10-21: qty 1

## 2023-10-21 MED ORDER — PROCHLORPERAZINE EDISYLATE 10 MG/2ML IJ SOLN
10.0000 mg | Freq: Once | INTRAMUSCULAR | Status: AC
  Administered 2023-10-21: 10 mg via INTRAVENOUS
  Filled 2023-10-21: qty 2

## 2023-10-21 MED ORDER — CLOPIDOGREL BISULFATE 75 MG PO TABS
150.0000 mg | ORAL_TABLET | Freq: Once | ORAL | Status: AC
  Administered 2023-10-21: 150 mg via ORAL
  Filled 2023-10-21: qty 2

## 2023-10-21 MED ORDER — HYDRALAZINE HCL 50 MG PO TABS
75.0000 mg | ORAL_TABLET | Freq: Three times a day (TID) | ORAL | Status: DC
Start: 2023-10-21 — End: 2023-10-23
  Administered 2023-10-21 – 2023-10-22 (×5): 75 mg via ORAL
  Filled 2023-10-21 (×4): qty 1
  Filled 2023-10-21: qty 2

## 2023-10-21 NOTE — Plan of Care (Signed)

## 2023-10-21 NOTE — Evaluation (Signed)
 Physical Therapy Evaluation Co-Eval with OT Patient Details Name: Richard Hunt MRN: [id number] DOB: Aug 31, 1960 Today's Date: 10/21/2023  History of Present Illness  Pt is a 63 y.o. male who presented to [facility] ED with complaints of sever and chronic headache that has been ongoing for about 2 months. He also states that he has had intermittent dizziness during this time. BP upon admission was 197/140; MRI shows Patchy small acute/subacute infarcts scattered within the callosal splenium on the left, and within the bilateral parietooccipital lobes. Background age-advanced chronic small vessel ischemic disease with multiple chronic lacunar infarcts, as described, Few nonspecific chronic microhemorrhages within the supratentorial brain, Asymmetrically diminutive right internal carotid artery at the imaged levels suspicious for the presence of a non-visualized upstream (more proximal) stenosis within the neck and Minor paranasal sinus mucosal thickening.PMH of hypertension, hyperlipidemia, diabetes mellitus, and venous insufficiency.  Clinical Impression  63 yo Male presents to hospital not feeling well with elevated BP and history of dizziness/headache. He was diagnosed with CVA. Patient is spanish speaking. His son, [name] was present in the room and he provided interpretation, assisting with history intake. Patient lives at home with his family. He is still driving and works part-time in [employer], <20 hours per week. He reports no vision changes at time of PT evaluation but reports mild dizziness and persistent posterior headache throughout session. However this did not affect his mobility. He was up and walking around room modified independent. Patient ambulated with SPC at baseline and used IV pole this session. He ambulates with wide base of support, reciprocal pattern with decreased knee flexion in swing and increased lateral lean during stance phase (limping). He reports this is related to  chronic knee pain and is not new. Patient demonstrates good standing balance being able to stand with feet together eyes open/closed without instability. He does demonstrate instability but no loss of balance when standing eyes closed and tilting head upward. As a result he reports some instability when taking a shower. OT recommended shower seat for safety. All Cranial nerves are present and intact; BUE/BLE gross strength is WFL, no numbness/tingling reported; Patient is currently functioning at baseline and is Mod I for all mobility. Therefore no acute care skilled needs identified. PT educated patient/family that if dizziness returns or starts to affect mobility to reach out to PCP for referral to outpatient PT. Patient agreeable.      If plan is discharge home, recommend the following: Assistance with cooking/housework;Assist for transportation   Can travel by private vehicle        Equipment Recommendations None recommended by PT  Recommendations for Other Services       Functional Status Assessment Patient has not had a recent decline in their functional status     Precautions / Restrictions Precautions Precautions: Fall Recall of Precautions/Restrictions: Intact Precaution/Restrictions Comments: reports no falls in last 6 months Restrictions Weight Bearing Restrictions Per Provider Order: No      Mobility  Bed Mobility Overal bed mobility: Independent (Simultaneous filing. User may not have seen previous data.)                  Transfers Overall transfer level: Independent (Simultaneous filing. User may not have seen previous data.)                      Ambulation/Gait Ambulation/Gait assistance: Modified independent (Device/Increase time) Gait Distance (Feet): 300 Feet Assistive device: IV Pole Gait Pattern/deviations: Step-through pattern Gait velocity: decreased  General Gait Details: ambulates with wide base of support, decreased knee flexion  during swing and increased lateral lean during mid stance (limping); reports this is chronic and baseline related to chronic knee pain  Stairs Stairs: Yes Stairs assistance: Supervision Stair Management: Two rails, Step to pattern Number of Stairs: 3 General stair comments: limited due to connected to IV pole; Pt reports no acute change in stair negotiation from baseline; demonstrates good safety awareness using rail  Wheelchair Mobility     Tilt Bed    Modified Rankin (Stroke Patients Only)       Balance Overall balance assessment: Modified Independent                                           Pertinent Vitals/Pain Pain Assessment Pain Score: 6  Pain Descriptors / Indicators: Headache    Home Living Family/patient expects to be discharged to:: Private residence Living Arrangements: Spouse/significant other;Children Available Help at Discharge: Available 24 hours/day Type of Home: House Home Access: Stairs to enter Entrance Stairs-Rails: Right;Left;Can reach both Entrance Stairs-Number of Steps: 6   Home Layout: One level Home Equipment: Rollator (4 wheels);Cane - single point      Prior Function Prior Level of Function : Independent/Modified Independent;Working/employed             Mobility Comments: pt ambulated with cane PRN, would use rollator first thing in the morning and then would switch to cane as needed; 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 to stay active; still driving ADLs Comments: mod I for self care ADLs including bathing/dressing; Does not have a shower seat and reports some unsteadiness when in shower especially when looking up     Extremity/Trunk Assessment   Upper Extremity Assessment Upper Extremity Assessment: Overall WFL for tasks assessed    Lower Extremity Assessment Lower Extremity Assessment: Generalized weakness (has history of chronic knee pain)    Cervical /  Trunk Assessment Cervical / Trunk Assessment: Normal  Communication   Communication Communication: No apparent difficulties;Other (comment) (spanish speaking, patient's son, [name] assisted with interpretation  Simultaneous filing. User may not have seen previous data.) Factors Affecting Communication: Non - English speaking, interpreter not available (son present and interpreted for session intermittently as pt understands most english)    Cognition Arousal: Alert (Simultaneous filing. User may not have seen previous data.) Behavior During Therapy: [facility] for tasks assessed/performed (Simultaneous filing. User may not have seen previous data.)   PT - Cognitive impairments: No apparent impairments                         Following commands: Intact (Simultaneous filing. User may not have seen previous data.)       Cueing Cueing Techniques: Verbal cues (Simultaneous filing. User may not have seen previous data.)     General Comments General comments (skin integrity, edema, etc.): PT did formal balance testing, Pt able to stand with feet together eyes open/closed, no sway, however does demonstrate unsteadiness when eyes closed and tilting head up; CN all present and intact; Reports mild dizziness but no vision deficits throughout evaluation; Also reports chronic posterior headache which did not affect mobility;    Exercises Other Exercises Other Exercises: Educated patient/family on role of PT and recommendations   Assessment/Plan    PT Assessment Patient does not  need any further PT services  PT Problem List         PT Treatment Interventions      PT Goals (Current goals can be found in the Care Plan section)  Acute Rehab PT Goals Patient Stated Goal: to go home PT Goal Formulation: With patient Time For Goal Achievement: 10/21/23 Potential to Achieve Goals: Good    Frequency       Co-evaluation PT/OT/SLP Co-Evaluation/Treatment: Yes Reason for Co-Treatment:  Complexity of the patient's impairments (multi-system involvement);Other (comment) PT goals addressed during session: Mobility/safety with mobility;Balance;Proper use of DME OT goals addressed during session: ADL's and self-care;Proper use of Adaptive equipment and DME       AM-PAC PT 6 Clicks Mobility  Outcome Measure Help needed turning from your back to your side while in a flat bed without using bedrails?: None Help needed moving from lying on your back to sitting on the side of a flat bed without using bedrails?: None Help needed moving to and from a bed to a chair (including a wheelchair)?: None Help needed standing up from a chair using your arms (e.g., wheelchair or bedside chair)?: None Help needed to walk in hospital room?: None Help needed climbing 3-5 steps with a railing? : None 6 Click Score: 24    End of Session   Activity Tolerance: Patient tolerated treatment well Patient left: in bed;with call bell/phone within reach;with family/visitor present   PT Visit Diagnosis: Unsteadiness on feet (R26.81);Dizziness and giddiness (R42)    Time: 8998-8976 PT Time Calculation (min) (ACUTE ONLY): 22 min   Charges:   PT Evaluation $PT Eval Low Complexity: 1 Low   PT General Charges $$ ACUTE PT VISIT: 1 Visit          Leo Weyandt PT, DPT 10/21/2023, 2:14 PM

## 2023-10-21 NOTE — Consult Note (Signed)
 NEUROLOGY CONSULT NOTE   Date of service: October 21, 2023 Patient Name: Richard Hunt MRN:  [id number] DOB:  [birth year] Chief Complaint: headache Requesting Provider: Jens Durand, MD  History of Present Illness  Richard Hunt is a 63 y.o. male with hx of hypertension, hyperlipidemia, diabetes who presents with headaches, blurred vision, slight unsteadiness.  He states that he has been having headaches increasing in frequency and severity over the past few months.  Yesterday he had headache on the right occipital area that was severe and that prompted him to come to the emergency department.  In the ER, he was noted to be hypertensive and MRI was obtained which demonstrates posterior circulation strokes with severe multifocal intracranial and extracranial disease.  LKW: Unclear, at least several weeks ago Modified rankin score: 0-Completely asymptomatic and back to baseline post- stroke IV Thrombolysis: No, outside of window EVT: No, outside window  NIHSS components Score: Comment  1a Level of Conscious 0[]  1[]  2[]  3[]      1b LOC Questions 0[]  1[]  2[]       1c LOC Commands 0[]  1[]  2[]       2 Best Gaze 0[]  1[]  2[]       3 Visual 0[]  1[]  2[]  3[]      4 Facial Palsy 0[]  1[]  2[]  3[]      5a Motor Arm - left 0[]  1[x]  2[]  3[]  4[]  UN[]    5b Motor Arm - Right 0[]  1[]  2[]  3[]  4[]  UN[]    6a Motor Leg - Left 0[]  1[x]  2[]  3[]  4[]  UN[]    6b Motor Leg - Right 0[]  1[]  2[]  3[]  4[]  UN[]    7 Limb Ataxia 0[]  1[]  2[]  UN[]      8 Sensory 0[]  1[x]  2[]  UN[]      9 Best Language 0[]  1[]  2[]  3[]      10 Dysarthria 0[]  1[]  2[]  UN[]      11 Extinct. and Inattention 0[]  1[]  2[]       TOTAL: 3       Past History   Past Medical History:  Diagnosis Date   Acute cholecystitis 12/11/2016   Acute kidney injury (nontraumatic) (HCC)    Bilateral edema of lower extremity 01/02/2014   Chronic venous insufficiency 01/16/2014   Diabetes (HCC) 12/11/2016   Diabetes mellitus without complication (HCC)     GERD (gastroesophageal reflux disease)    HLD (hyperlipidemia)    HTN (hypertension) 12/11/2016   Hypercholesterolemia    Hypertension    Osteoarthritis    Polycystic kidney disease    Ulcer of calf (HCC) 08/29/2013    Past Surgical History:  Procedure Laterality Date   CHOLECYSTECTOMY N/A 12/11/2016   Procedure: LAPAROSCOPIC CHOLECYSTECTOMY;  Surgeon: Shelva Dunnings, MD;  Location: [facility] ORS;  Service: General;  Laterality: N/A;   VEIN SURGERY      Family History: Family History  Problem Relation Age of Onset   Diabetes Mother     Social History  reports that he has never smoked. He has never used smokeless tobacco. He reports that he does not drink alcohol and does not use drugs.  No Known Allergies  Medications   Current Facility-Administered Medications:    acetaminophen  (TYLENOL ) tablet 650 mg, 650 mg, Oral, Q4H PRN, 650 mg at 10/21/23 0949 **OR** acetaminophen  (TYLENOL ) 160 MG/5ML solution 650 mg, 650 mg, Per Tube, Q4H PRN **OR** acetaminophen  (TYLENOL ) suppository 650 mg, 650 mg, Rectal, Q4H PRN, Swayze, Ava, DO   amLODipine  (NORVASC ) tablet 5 mg, 5 mg, Oral, Daily, Ayiku, Bernard, MD, 5 mg at 10/21/23 1610  aspirin  chewable tablet 81 mg, 81 mg, Oral, Daily, Swayze, Ava, DO, 81 mg at 10/21/23 0944   atorvastatin  (LIPITOR ) tablet 40 mg, 40 mg, Oral, Daily, Ayiku, Bernard, MD, 40 mg at 10/21/23 1610   clopidogrel  (PLAVIX ) tablet 75 mg, 75 mg, Oral, Daily, Swayze, Ava, DO, 75 mg at 10/21/23 0944   gabapentin  (NEURONTIN ) capsule 100 mg, 100 mg, Oral, QHS, Ayiku, Bernard, MD   heparin  injection 5,000 Units, 5,000 Units, Subcutaneous, Q8H, Swayze, Ava, DO, 5,000 Units at 10/21/23 1345   hydrALAZINE  (APRESOLINE ) tablet 75 mg, 75 mg, Oral, TID, Jens Durand, MD, 75 mg at 10/21/23 1610   hydrochlorothiazide  (HYDRODIURIL ) tablet 25 mg, 25 mg, Oral, Daily, Ayiku, Bernard, MD, 25 mg at 10/21/23 1610   insulin  aspart (novoLOG ) injection 0-15 Units, 0-15 Units, Subcutaneous, TID  WC, Swayze, Ava, DO, 5 Units at 10/21/23 1352   LORazepam  (ATIVAN ) tablet 0.5 mg, 0.5 mg, Oral, Once PRN, Swayze, Ava, DO   Oral care mouth rinse, 15 mL, Mouth Rinse, PRN, Swayze, Ava, DO   pantoprazole  (PROTONIX ) EC tablet 40 mg, 40 mg, Oral, Daily, Swayze, Ava, DO, 40 mg at 10/21/23 0944   senna-docusate (Senokot-S) tablet 1 tablet, 1 tablet, Oral, QHS PRN, Swayze, Ava, DO   traMADol  (ULTRAM ) tablet 50 mg, 50 mg, Oral, Q8H PRN, Jens Durand, MD  Vitals   Vitals:   10/21/23 0457 10/21/23 0848 10/21/23 1235 10/21/23 1606  BP: (!) 177/94 (!) 169/81 (!) 177/90 (!) 173/79  Pulse: 78 75 74 82  Resp: 18     Temp: 97.8 F (36.6 C) 97.8 F (36.6 C) 97.8 F (36.6 C)   TempSrc: Oral     SpO2: 100% 99% 100% 100%  Weight:      Height:        Body mass index is 35.62 kg/m.   Physical Exam   Constitutional: Appears well-developed and well-nourished.   Neurologic Examination    Neuro: Mental Status: Patient is awake, alert, oriented to person, place, month, year, and situation. Patient is able to give a clear and coherent history. No signs of aphasia or neglect Cranial Nerves: II: Visual Fields are full. Pupils are equal, round, and reactive to light.   III,IV, VI: EOMI without ptosis or diploplia.  V: Facial sensation is diminished on the right VII: Facial movement is symmetric.  VIII: hearing is intact to voice X: Uvula elevates symmetrically XII: tongue is midline without atrophy or fasciculations.  Motor: Tone is normal. Bulk is normal. 5/5 strength was present ion the right, he has subtle left leg > left arm weakness Sensory: Sensation is symmetric to light touch and temperature in the arms and legs. Cerebellar: FNF intact bilaterally        Labs/Imaging/Neurodiagnostic studies   CBC:  Recent Labs  Lab 30-Oct-2023 1300  WBC 10.0  NEUTROABS 7.0  HGB 12.4*  HCT 34.7*  MCV 87.0  PLT 235   Basic Metabolic Panel:  Lab Results  Component Value Date   NA 140  Oct 30, 2023   K 3.9 October 30, 2023   CO2 23 30-Oct-2023   GLUCOSE 129 (H) Oct 30, 2023   BUN 34 (H) October 30, 2023   CREATININE 2.22 (H) 10-30-2023   CALCIUM  8.7 (L) 10-30-23   GFRNONAA 32 (L) 2023-10-30   GFRAA >60 12/12/2016   Lipid Panel:  Lab Results  Component Value Date   LDLCALC 74 10/21/2023   HgbA1c:  Lab Results  Component Value Date   HGBA1C 6.1 (H) 10/30/2023   Urine Drug Screen:  Component Value Date/Time   LABOPIA NONE DETECTED 10/20/2023 1618   COCAINSCRNUR NONE DETECTED 10/20/2023 1618   LABBENZ NONE DETECTED 10/20/2023 1618   AMPHETMU NONE DETECTED 10/20/2023 1618   THCU NONE DETECTED 10/20/2023 1618   LABBARB NONE DETECTED 10/20/2023 1618    Alcohol Level     Component Value Date/Time   ETH <5 12/10/2016 0122   INR  Lab Results  Component Value Date   INR 1.0 10/20/2023   APTT  Lab Results  Component Value Date   APTT 29 10/20/2023   MRI Brain(Personally reviewed): Multifocal posterior circulation strokes  ASSESSMENT   Richard Hunt is a 64 y.o. male with several weeks of blurred vision, headaches.  He has multiple reasons for atherosclerotic disease, and therefore this is my suspicion, however given his progressive headaches I am concerned that there may be some other underlying process. I would favor checking for inflammation with LP and checking autoimmune labs to assess for possible vasculitis.   RECOMMENDATIONS  ESR, CRP, ANCA, C3, C4, SSA, SSB, cryoglobulin, ana w/ reflex to broader lupus panel, igg/iga/igm Consider LP to look for inflammatory response Compazine  10mg  IV for headache Increase lipitor  to 80mg  at bedtime Continue DAPT with ASA and plavix  Pt,ot,st Permissve htn Will follow   ______________________________________________________________________    Bonney Aisha Seals, MD [facility]

## 2023-10-21 NOTE — Progress Notes (Signed)
  Echocardiogram 2D Echocardiogram has been performed. Bubble Study (Saline Microcavitation) requested and performed.  Richard Hunt 10/21/2023, 11:13 AM

## 2023-10-21 NOTE — Progress Notes (Signed)
 SLP Cancellation Note  Patient Details Name: [name] MRN: [id number] DOB: 08-25-1960   Cancelled treatment:       Reason Eval/Treat Not Completed: Patient at procedure or test/unavailable. ECHO with pt, will continue efforts as schedule allows.  Ronal Landry [place], [place], Sports administrator Office: [phone] [facility]: [phone]     Ronal FORBES [place] 10/21/2023, 10:29 AM

## 2023-10-21 NOTE — Evaluation (Signed)
 Occupational Therapy Evaluation Patient Details Name: Richard Hunt MRN: [id number] DOB: 01-01-1961 Today's Date: 10/21/2023   History of Present Illness   Pt is a 63 y.o. male who presented to [facility] ED with complaints of sever and chronic headache that has been ongoing for about 2 months. He also states that he has had intermittent dizziness during this time. BP upon admission was 197/140; MRI shows Patchy small acute/subacute infarcts scattered within the callosal splenium on the left, and within the bilateral parietooccipital lobes. Background age-advanced chronic small vessel ischemic disease with multiple chronic lacunar infarcts, as described, Few nonspecific chronic microhemorrhages within the supratentorial brain, Asymmetrically diminutive right internal carotid artery at the imaged levels suspicious for the presence of a non-visualized upstream (more proximal) stenosis within the neck and Minor paranasal sinus mucosal thickening.PMH of hypertension, hyperlipidemia, diabetes mellitus, and venous insufficiency.     Clinical Impressions Pt was seen for OT evaluation this date. PTA, pt resides in a one level home with his wife, 2 sons and additional family member. There are ~4-6 STE with bil HR. He is IND with ADLs and simple IADLs at baseline, reports using a SPC for ambulation when he remembers to. He has a rollator he will at times sit on in the mornings to get around until he is more awake. 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 Pt is ambulating with MOD I pushing IV pole to bathroom on entry. He demo all toileting tasks with IND/MOD I and additional ambulation for 2 bouts around the nursing station with MOD I. He demo stair training with SUP and use of hand rail. No weakness, sensory or coordination deficits noted. Pt c/o mild dizziness and headache that does not affect his mobility/balance. He has chronic bil knee pain at baseline with mild limp in gait. HE reports  intermittent double vision/blurring that is not present during evaluation. Pt with mild instability standing with eyes closed and looking upwards. Edu on use of shower chair for bathing to prevent falls/LOB, e.g. standing in shower to wash hair. Also edu on sitting to perform UB dressing as he may close his eyes to get a shirt on. No further acute OT needs and no OT follow up needed on DC. Pt and family verbalized understanding of all education and are looking into getting a shower chair.      If plan is discharge home, recommend the following:         Functional Status Assessment   Patient has not had a recent decline in their functional status     Equipment Recommendations   Tub/shower seat     Recommendations for Other Services         Precautions/Restrictions   Precautions Precautions: Fall Recall of Precautions/Restrictions: Intact Precaution/Restrictions Comments: reports no falls in last 6 months Restrictions Weight Bearing Restrictions Per Provider Order: No     Mobility Bed Mobility                    Transfers                   General transfer comment: pushed IV pole for all ambulation and stair training      Balance Overall balance assessment: Modified Independent  ADL either performed or assessed with clinical judgement   ADL Overall ADL's : Needs assistance/impaired                         Toilet Transfer: Modified Independent   Toileting- Clothing Manipulation and Hygiene: Modified independent       Functional mobility during ADLs: Modified independent       Vision Patient Visual Report: Blurring of vision;Diplopia Additional Comments: pt reports intermittent blurring/diplopia but none during evaluation     Perception         Praxis         Pertinent Vitals/Pain Pain Assessment Pain Assessment: Faces (Simultaneous filing. User may not have  seen previous data.) Faces Pain Scale: Hurts a little bit Pain Location: bil knees with mobility (Simultaneous filing. User may not have seen previous data.) Pain Intervention(s): Monitored during session, Repositioned (Simultaneous filing. User may not have seen previous data.)     Extremity/Trunk Assessment Upper Extremity Assessment Upper Extremity Assessment: Overall WFL for tasks assessed   Lower Extremity Assessment Lower Extremity Assessment: Generalized weakness (has history of chronic knee pain)   Cervical / Trunk Assessment Cervical / Trunk Assessment: Normal   Communication Communication Communication: No apparent difficulties;Other (comment) (spanish speaking, patient's son, [name] assisted with interpretation  Simultaneous filing. User may not have seen previous data.) Factors Affecting Communication: Non - English speaking, interpreter not available (son present and interpreted for session intermittently as pt understands most english)   Cognition     Cognition: No apparent impairments                                       Cueing  General Comments      reports mild dizziness and headache throughout session that did not affect his mobility   Exercises Other Exercises Other Exercises: Edu on use of shower chair for bathing d/t pt having most dizziness/unsteadiness with eyes closed looking up, e.g. standing in shower to wash hair. Also edu on sitting to perform UB dressing as he may close his eyes to get a shirt on.   Shoulder Instructions      Home Living Family/patient expects to be discharged to:: Private residence Living Arrangements: Spouse/significant other;Children Available Help at Discharge: Available 24 hours/day Type of Home: House Home Access: Stairs to enter [employer] of Steps: 6 Entrance Stairs-Rails: Right;Left;Can reach both Home Layout: One level     Bathroom Shower/Tub: Chief Strategy Officer:  Standard     Home Equipment: Rollator (4 wheels);Cane - single point          Prior Functioning/Environment Prior Level of Function : Independent/Modified Independent;Working/employed             Mobility Comments: pt ambulated with cane PRN, would use rollator first thing in the morning and then would switch to cane as needed; 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 to stay active; still driving ADLs Comments: mod I for self care ADLs including bathing/dressing; Does not have a shower seat and reports some unsteadiness when in shower especially when looking up    OT Problem List:     OT Treatment/Interventions:        OT Goals(Current goals can be found in the care plan section)       OT Frequency:  Co-evaluation PT/OT/SLP Co-Evaluation/Treatment: Yes Reason for Co-Treatment: Complexity of the patient's impairments (multi-system involvement);Other (comment) PT goals addressed during session: Mobility/safety with mobility;Balance;Proper use of DME OT goals addressed during session: ADL's and self-care;Proper use of Adaptive equipment and DME      AM-PAC OT 6 Clicks Daily Activity     Outcome Measure Help from another person eating meals?: None Help from another person taking care of personal grooming?: None Help from another person toileting, which includes using toliet, bedpan, or urinal?: None Help from another person bathing (including washing, rinsing, drying)?: None Help from another person to put on and taking off regular upper body clothing?: None Help from another person to put on and taking off regular lower body clothing?: None 6 Click Score: 24   End of Session Equipment Utilized During Treatment:  (pushed IV pole)  Activity Tolerance: Patient tolerated treatment well Patient left: in bed;with call bell/phone within reach;with family/visitor present                   Time: 1001-1023 OT Time  Calculation (min): 22 min Charges:  OT General Charges $OT Visit: 1 Visit OT Evaluation $OT Eval Low Complexity: 1 Low  Quanisha Drewry, OTR/L 10/21/23, 1:58 PM  Zyon Grout E Stephanine Reas 10/21/2023, 1:54 PM

## 2023-10-21 NOTE — Progress Notes (Addendum)
 Progress Note    Richard Hunt  FMW:[id number] DOB: [birth year]  DOA: 10/20/2023 PCP: [facility]      Brief Narrative:    Medical records reviewed and are as summarized below:  Richard Hunt is a 63 y.o. male with medical history significant hypertension, hyperlipidemia, diabetes mellitus, CKD stage IIIb with proteinuria, chronic venous insufficiency, who presented to the hospital because of headache and intermittent dizziness for over 2 months.  He is a known hypertensive urgency and has been medically adherent with medications.  Blood pressure has still been uncontrolled.  Family reported that sometimes his speech has been difficult to understand.   In the ED, BP was 197/140.  MRI brain reviewed by the small acute/subacute infarct scattered within the callosal splenium on the left and within the bilateral parieto-occipital lobes.     Assessment/Plan:   Principal Problem:   CVA (cerebral vascular accident) (HCC) Active Problems:   Diabetes (HCC)   HTN (hypertension)   HLD (hyperlipidemia)   GERD (gastroesophageal reflux disease)   Body mass index is 35.62 kg/m.  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  Case discussed with Dr. Michaela, neurologist.  Patient will need dual antiplatelet therapy with aspirin  and Plavix  for 3 months. 2D echo showed EF estimated at 60 to 65%, moderate concentric LVH, grade 1 diastolic dysfunction and no evidence of interatrial shunt. Lower extremity pain: Venous duplex did not show any evidence of DVT. MRA head and neck showed severe narrowing at different levels of of right ICA, right MCA, right PCA and occlusion of right vertebral artery just proximal to the vertebrobasilar confluence, and severe near complete stenosis of the distal left vertebral artery.SABRA  He was evaluated by ST, PT and OT.  No skilled therapy required. Follow-up with your  neurologist for further recommendations.   Hypertensive urgency: He has had uncontrolled hypertension for months now.  Resume home amlodipine , hydralazine  hydrochlorothiazide    Type II DM with hyperglycemia: NovoLog  as needed for hyperglycemia.  Hold glimepiride. Hemoglobin A1c 6.1.   CKD stage IIIb, proteinuria: No AKI.  Creatinine appears to be at baseline.  Chart review showed that baseline creatinine between 1.8-2.4. Urine albumin creatinine ratio was 1,974.1 on 08/17/2023.   Hyperlipidemia: Controlled.  LDL 74, HDL 41, triglycerides 113 and total cholesterol 138.  Continue Lipitor        Diet Order             Diet Heart Fluid consistency: Thin  Diet effective now                            Consultants: Neurologist  Procedures: None    Medications:    amLODipine   5 mg Oral Daily   aspirin   81 mg Oral Daily   atorvastatin   40 mg Oral Daily   clopidogrel   75 mg Oral Daily   gabapentin   100 mg Oral QHS   heparin   5,000 Units Subcutaneous Q8H   hydrALAZINE   75 mg Oral TID   hydrochlorothiazide   25 mg Oral Daily   insulin  aspart  0-15 Units Subcutaneous TID WC   pantoprazole   40 mg Oral Daily   prochlorperazine   10 mg Intravenous Once   Continuous Infusions:     Anti-infectives (From admission, onward)    None              Family Communication/Anticipated D/C date and plan/Code Status  DVT prophylaxis: heparin  injection 5,000 Units Start: 10/20/23 2200     Code Status: Full Code  Family Communication: Plan was discussed with [name], son, at the bedside Disposition Plan: Plan to discharge home   Status is: Inpatient Remains inpatient appropriate because: Acute stroke       Subjective:   Interval events noted.  He complains of headache the occipital area.  No other complaints.  No unilateral weakness or numbness in the extremities.  He had noticed word finding difficulties recently prior to admission.  Objective:     Vitals:   10/21/23 0024 10/21/23 0457 10/21/23 0848 10/21/23 1235  BP: (!) 152/90 (!) 177/94 (!) 169/81 (!) 177/90  Pulse: 66 78 75 74  Resp: 18 18    Temp: 97.7 F (36.5 C) 97.8 F (36.6 C) 97.8 F (36.6 C) 97.8 F (36.6 C)  TempSrc: Oral Oral    SpO2: 98% 100% 99% 100%  Weight:      Height:       No data found.   Intake/Output Summary (Last 24 hours) at 10/21/2023 1600 Last data filed at 10/21/2023 1532 Gross per 24 hour  Intake 1047.33 ml  Output --  Net 1047.33 ml   Filed Weights   10/20/23 1146  Weight: 97.1 kg    Exam:  GEN: NAD SKIN: Warm and dry EYES: No pallor or icterus ENT: MMM CV: RRR PULM: CTA B ABD: soft, ND, NT, +BS CNS: AAO x 3, very mild left hemiparesis EXT: No edema or tenderness         Data Reviewed:   I have personally reviewed following labs and imaging studies:  Labs: Labs show the following:   Basic Metabolic Panel: Recent Labs  Lab 10/20/23 1300  NA 140  K 3.9  CL 108  CO2 23  GLUCOSE 129*  BUN 34*  CREATININE 2.22*  CALCIUM  8.7*   GFR Estimated Creatinine Clearance: 35.5 mL/min (A) (by C-G formula based on SCr of 2.22 mg/dL (H)). Liver Function Tests: No results for input(s): AST, ALT, ALKPHOS, BILITOT, PROT, ALBUMIN in the last 168 hours. No results for input(s): LIPASE, AMYLASE in the last 168 hours. No results for input(s): AMMONIA in the last 168 hours. Coagulation profile Recent Labs  Lab 10/20/23 1548  INR 1.0    CBC: Recent Labs  Lab 10/20/23 1300  WBC 10.0  NEUTROABS 7.0  HGB 12.4*  HCT 34.7*  MCV 87.0  PLT 235   Cardiac Enzymes: No results for input(s): CKTOTAL, CKMB, CKMBINDEX, TROPONINI in the last 168 hours. BNP (last 3 results) No results for input(s): PROBNP in the last 8760 hours. CBG: Recent Labs  Lab 10/20/23 2201 10/21/23 0850 10/21/23 1235 10/21/23 1348  GLUCAP 182* 105* 195* 207*   D-Dimer: No results for input(s): DDIMER in the  last 72 hours. Hgb A1c: Recent Labs    10/20/23 1300  HGBA1C 6.1*   Lipid Profile: Recent Labs    10/21/23 0455  CHOL 138  HDL 41  LDLCALC 74  TRIG 113  CHOLHDL 3.4   Thyroid function studies: No results for input(s): TSH, T4TOTAL, T3FREE, THYROIDAB in the last 72 hours.  Invalid input(s): FREET3 Anemia work up: No results for input(s): VITAMINB12, FOLATE, FERRITIN, TIBC, IRON, RETICCTPCT in the last 72 hours. Sepsis Labs: Recent Labs  Lab 10/20/23 1300  WBC 10.0    Microbiology No results found for this or any previous visit (from the past 240 hours).  Procedures and diagnostic studies:  US  Venous Img Lower  Bilateral (DVT) Result Date: 10/21/2023 CLINICAL DATA:  Leg pain.  Stroke EXAM: BILATERAL LOWER EXTREMITY VENOUS DOPPLER ULTRASOUND TECHNIQUE: Gray-scale sonography with graded compression, as well as color Doppler and duplex ultrasound were performed to evaluate the lower extremity deep venous systems from the level of the common femoral vein and including the common femoral, femoral, profunda femoral, popliteal and calf veins including the posterior tibial, peroneal and gastrocnemius veins when visible. The superficial great saphenous vein was also interrogated. Spectral Doppler was utilized to evaluate flow at rest and with distal augmentation maneuvers in the common femoral, femoral and popliteal veins. COMPARISON:  Left lower extremity DVT ultrasound 12/20/2016 FINDINGS: RIGHT LOWER EXTREMITY Common Femoral Vein: No evidence of thrombus. Normal compressibility, respiratory phasicity and response to augmentation. Saphenofemoral Junction: No evidence of thrombus. Normal compressibility and flow on color Doppler imaging. Profunda Femoral Vein: No evidence of thrombus. Normal compressibility and flow on color Doppler imaging. Femoral Vein: No evidence of thrombus. Normal compressibility, respiratory phasicity and response to augmentation. Popliteal Vein:  No evidence of thrombus. Normal compressibility, respiratory phasicity and response to augmentation. Calf Veins: No evidence of thrombus. Normal compressibility and flow on color Doppler imaging. Superficial Great Saphenous Vein: No evidence of thrombus. Normal compressibility. Venous Reflux:  None. Other Findings: Laterally there is a oblong fluid collection measuring 3.0 x 0.6 cm at the level of the knee. LEFT LOWER EXTREMITY Common Femoral Vein: No evidence of thrombus. Normal compressibility, respiratory phasicity and response to augmentation. Saphenofemoral Junction: No evidence of thrombus. Normal compressibility and flow on color Doppler imaging. Profunda Femoral Vein: No evidence of thrombus. Normal compressibility and flow on color Doppler imaging. Femoral Vein: No evidence of thrombus. Normal compressibility, respiratory phasicity and response to augmentation. Popliteal Vein: No evidence of thrombus. Normal compressibility, respiratory phasicity and response to augmentation. Calf Veins: No evidence of thrombus. Normal compressibility and flow on color Doppler imaging. Superficial Great Saphenous Vein: No evidence of thrombus. Normal compressibility. Venous Reflux:  None. Other Findings:  None. IMPRESSION: No evidence of deep venous thrombosis in either lower extremity. Oblong fluid collection seen lateral to the right knee. Please correlate for any known history or dedicated workup when appropriate. Please correlate for any prior history or of infection or trauma. Electronically Signed   By: Ranell Bring M.D.   On: 10/21/2023 15:19   ECHOCARDIOGRAM COMPLETE Result Date: 10/21/2023    ECHOCARDIOGRAM REPORT   Patient Name:   DREDEN RIVERE Date of Exam: 10/21/2023 Medical Rec #:  [id number]               Height:       65.0 in Accession #:    [id number]              Weight:       214.1 lb Date of Birth:  1961-01-08               BSA:          2.036 m Patient Age:    63 years                BP:            177/94 mmHg Patient Gender: M                       HR:           79 bpm. Exam Location:  [facility] Procedure: 2D Echo, Cardiac Doppler, Color Doppler, Strain Analysis and Saline  Contrast Bubble Study (Both Spectral and Color Flow Doppler were            utilized during procedure). Indications:     Stroke I63.9  History:         Patient has no prior history of Echocardiogram examinations.  Sonographer:     Thedora Louder RDCS, FASE Referring Phys:  [id number] BRIGIDA BUREAU Diagnosing Phys: Cara JONETTA Lovelace MD  Sonographer Comments: Global longitudinal strain was attempted. IMPRESSIONS  1. Left ventricular ejection fraction, by estimation, is 60 to 65%. The left ventricle has normal function. The left ventricle has no regional wall motion abnormalities. The left ventricular internal cavity size was mildly dilated. There is moderate concentric left ventricular hypertrophy. Left ventricular diastolic parameters are consistent with Grade I diastolic dysfunction (impaired relaxation). The average left ventricular global longitudinal strain is 16.5 %. The global longitudinal strain is normal.  2. Right ventricular systolic function is normal. The right ventricular size is normal.  3. Left atrial size was mildly dilated.  4. The mitral valve is normal in structure. Mild mitral valve regurgitation.  5. The aortic valve is normal in structure. Aortic valve regurgitation is not visualized. Aortic valve sclerosis/calcification is present, without any evidence of aortic stenosis. FINDINGS  Left Ventricle: Left ventricular ejection fraction, by estimation, is 60 to 65%. The left ventricle has normal function. The left ventricle has no regional wall motion abnormalities. The average left ventricular global longitudinal strain is 16.5 %. Strain was performed and the global longitudinal strain is normal. The left ventricular internal cavity size was mildly dilated. There is moderate concentric left ventricular hypertrophy. Left  ventricular diastolic parameters are consistent with Grade I diastolic dysfunction (impaired relaxation). Right Ventricle: The right ventricular size is normal. No increase in right ventricular wall thickness. Right ventricular systolic function is normal. Left Atrium: Left atrial size was mildly dilated. Right Atrium: Right atrial size was normal in size. Pericardium: There is no evidence of pericardial effusion. Mitral Valve: The mitral valve is normal in structure. Mild mitral valve regurgitation. Tricuspid Valve: The tricuspid valve is normal in structure. Tricuspid valve regurgitation is mild. Aortic Valve: The aortic valve is normal in structure. Aortic valve regurgitation is not visualized. Aortic valve sclerosis/calcification is present, without any evidence of aortic stenosis. Aortic valve peak gradient measures 10.9 mmHg. Pulmonic Valve: The pulmonic valve was grossly normal. Pulmonic valve regurgitation is not visualized. Aorta: The ascending aorta was not well visualized. IAS/Shunts: No atrial level shunt detected by color flow Doppler. Agitated saline contrast was given intravenously to evaluate for intracardiac shunting. Additional Comments: 3D was performed not requiring image post processing on an independent workstation and was indeterminate.  LEFT VENTRICLE PLAX 2D LVIDd:         5.20 cm   Diastology LVIDs:         3.50 cm   LV e' medial:    5.55 cm/s LV PW:         1.60 cm   LV E/e' medial:  14.8 LV IVS:        1.50 cm   LV e' lateral:   6.42 cm/s LVOT diam:     2.10 cm   LV E/e' lateral: 12.8 LV SV:         73 LV SV Index:   36        2D Longitudinal Strain LVOT Area:     3.46 cm  2D Strain GLS (A4C):   13.8 %  2D Strain GLS (A3C):   20.2 %                          2D Strain GLS (A2C):   15.4 %                          2D Strain GLS Avg:     16.5 % RIGHT VENTRICLE RV Basal diam:  3.10 cm RV S prime:     15.90 cm/s TAPSE (M-mode): 2.0 cm LEFT ATRIUM             Index         RIGHT ATRIUM           Index LA diam:        4.30 cm 2.11 cm/m   RA Area:     11.50 cm LA Vol (A2C):   24.4 ml 11.98 ml/m  RA Volume:   28.00 ml  13.75 ml/m LA Vol (A4C):   37.9 ml 18.61 ml/m LA Biplane Vol: 32.7 ml 16.06 ml/m  AORTIC VALVE                 PULMONIC VALVE AV Area (Vmax): 2.33 cm     PV Vmax:        1.22 m/s AV Vmax:        165.00 cm/s  PV Peak grad:   6.0 mmHg AV Peak Grad:   10.9 mmHg    RVOT Peak grad: 5 mmHg LVOT Vmax:      111.00 cm/s LVOT Vmean:     68.700 cm/s LVOT VTI:       0.210 m  AORTA Ao Root diam: 3.90 cm Ao Asc diam:  3.30 cm MITRAL VALVE MV Area (PHT): 3.89 cm     SHUNTS MV Decel Time: 195 msec     Systemic VTI:  0.21 m MV E velocity: 82.30 cm/s   Systemic Diam: 2.10 cm MV A velocity: 104.00 cm/s MV E/A ratio:  0.79 Dwayne D Callwood MD Electronically signed by Cara JONETTA Lovelace MD Signature Date/Time: 10/21/2023/2:17:33 PM    Final    MR ANGIO HEAD WO CONTRAST Result Date: 10/20/2023 CLINICAL DATA:  Stroke/TIA EXAM: MRA NECK WITHOUT AND WITH CONTRAST MRA HEAD WITHOUT CONTRAST TECHNIQUE: Multiplanar and multiecho pulse sequences of the neck were obtained without and with intravenous contrast. Angiographic images of the neck were obtained using MRA technique without and with intravenous contrast.; Angiographic images of the Circle of Willis were obtained using MRA technique without intravenous contrast. CONTRAST:  10mL GADAVIST  GADOBUTROL  1 MMOL/ML IV SOLN COMPARISON:  None Available. FINDINGS: MRA NECK FINDINGS Aortic arch: There is a normal variant aortic arch branching pattern with the brachiocephalic and left common carotid arteries sharing a common origin. Right carotid system: The right ICA is severely narrowed at its origin (with focal poststenotic dilatation of the proximal ICA. The distal ICA is somewhat irregular with undulation and multifocal stenosis. The right ICA terminus appears occluded past the ophthalmic segment. Left carotid system: Unremarkable Vertebral  arteries: Patent with antegrade flow. Other: None. MRA HEAD FINDINGS POSTERIOR CIRCULATION: The right vertebral artery is occluded just proximal to the vertebrobasilar confluence. There is severe, near complete stenosis of the distal left vertebral artery. Basilar artery is patent and normal caliber. Superior cerebellar arteries are normal. Severe stenosis of the right PCA P2 segment. Narrowing of the distal P3 segment of the left PCA. ANTERIOR CIRCULATION: Poor  flow related enhancement of the right internal carotid artery. The petrous segment is markedly narrowed and the lacerum and proximal cavernous segments are likely occluded. The terminus also appears occluded. Unremarkable left ICA. Anterior cerebral arteries are normal. 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 Normal left MCA. Anatomic Variants: None IMPRESSION: 1. 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 2. The right ICA terminus appears occluded past the ophthalmic segment. 3. 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 4. Severe stenosis of the right PCA P2 segment. 5. 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 6. Severe, near complete stenosis of the distal left vertebral artery. Electronically Signed   By: Franky Stanford M.D.   On: 10/20/2023 23:47   MR ANGIO NECK W WO CONTRAST Result Date: 10/20/2023 CLINICAL DATA:  Stroke/TIA EXAM: MRA NECK WITHOUT AND WITH CONTRAST MRA HEAD WITHOUT CONTRAST TECHNIQUE: Multiplanar and multiecho pulse sequences of the neck were obtained without and with intravenous contrast. Angiographic images of the neck were obtained using MRA technique without and with intravenous contrast.; Angiographic images of the Circle of Willis were obtained using MRA technique without intravenous contrast. CONTRAST:  10mL GADAVIST  GADOBUTROL  1 MMOL/ML IV SOLN COMPARISON:  None  Available. FINDINGS: MRA NECK FINDINGS Aortic arch: There is a normal variant aortic arch branching pattern with the brachiocephalic and left common carotid arteries sharing a common origin. Right carotid system: The right ICA is severely narrowed at its origin (with focal poststenotic dilatation of the proximal ICA. The distal ICA is somewhat irregular with undulation and multifocal stenosis. The right ICA terminus appears occluded past the ophthalmic segment. Left carotid system: Unremarkable Vertebral arteries: Patent with antegrade flow. Other: None. MRA HEAD FINDINGS POSTERIOR CIRCULATION: The right vertebral artery is occluded just proximal to the vertebrobasilar confluence. There is severe, near complete stenosis of the distal left vertebral artery. Basilar artery is patent and normal caliber. Superior cerebellar arteries are normal. Severe stenosis of the right PCA P2 segment. Narrowing of the distal P3 segment of the left PCA. ANTERIOR CIRCULATION: Poor flow related enhancement of the right internal carotid artery. The petrous segment is markedly narrowed and the lacerum and proximal cavernous segments are likely occluded. The terminus also appears occluded. Unremarkable left ICA. Anterior cerebral arteries are normal. 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 Normal left MCA. Anatomic Variants: None IMPRESSION: 1. 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 2. The right ICA terminus appears occluded past the ophthalmic segment. 3. 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 4. Severe stenosis of the right PCA P2 segment. 5. 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 6. Severe, near complete stenosis of the distal left vertebral artery. Electronically Signed   By: Franky Stanford M.D.   On: 10/20/2023 23:47   MR BRAIN WO CONTRAST Result Date:  10/20/2023 CLINICAL DATA:  Provided history: Neuro deficit, acute, stroke suspected. EXAM: MRI HEAD WITHOUT CONTRAST TECHNIQUE: Multiplanar, multiecho pulse sequences of the brain and surrounding structures were obtained without intravenous contrast. COMPARISON:  Head CT 10/20/2023. FINDINGS: Brain: No age-advanced or lobar predominant cerebral atrophy. Patchy small acute/subacute infarcts scattered within the callosal splenium on the left and within the bilateral parietooccipital lobes. Chronic lacunar infarcts within/about the bilateral deep gray nuclei. Moderate multifocal T2 FLAIR  hyperintense signal abnormality elsewhere the cerebral white matter, nonspecific but compatible chronic small vessel ischemic disease. Small chronic lacunar infarct within the right middle cerebellar peduncle. Few nonspecific chronic microhemorrhages scattered within the supratentorial brain. No evidence of an intracranial mass. No extra-axial fluid collection. No midline shift. Vascular: Asymmetrically diminutive right internal carotid artery at the imaged levels suspicious for the presence of a nonvisualized upstream (more proximal) stenosis within the neck. Flow voids preserved elsewhere within the proximal large arterial vessels. Skull and upper cervical spine: No focal worrisome marrow lesion. Sinuses/Orbits: No mass or acute finding within the imaged orbits. Minimal mucosal thickening within the bilateral maxillary, sphenoid and ethmoid sinuses. Other: Trace fluid within left mastoid air cells. IMPRESSION: 1. Patchy small acute/subacute infarcts scattered within the callosal splenium on the left, and within the bilateral parietooccipital lobes. 2. Background age-advanced chronic small vessel ischemic disease with multiple chronic lacunar infarcts, as described. 3. Few nonspecific chronic microhemorrhages within the supratentorial brain. 4. Asymmetrically diminutive right internal carotid artery at the imaged levels suspicious for  the presence of a non-visualized upstream (more proximal) stenosis within the neck. Consider CT or MR angiography further evaluation. 5. Minor paranasal sinus mucosal thickening. 6. Trace fluid within left mastoid air cells. Electronically Signed   By: Rockey Childs D.O.   On: 10/20/2023 15:33   CT Head Wo Contrast Result Date: 10/20/2023 CLINICAL DATA:  Provided history: Hypertension.  Headache. EXAM: CT HEAD WITHOUT CONTRAST TECHNIQUE: Contiguous axial images were obtained from the base of the skull through the vertex without intravenous contrast. RADIATION DOSE REDUCTION: This exam was performed according to the departmental dose-optimization program which includes automated exposure control, adjustment of the mA and/or kV according to patient size and/or use of iterative reconstruction technique. COMPARISON:  None. FINDINGS: Brain: No age-advanced or lobar predominant cerebral atrophy. Small age-indeterminate infarct within the right cerebellar hemisphere (series 3, image 11) (series 4, image 49). Patchy and ill-defined hypoattenuation within the cerebral white matter, nonspecific but compatible with moderate chronic small vessel ischemic disease. There is no acute intracranial hemorrhage. No demarcated cortical infarct. No extra-axial fluid collection. No evidence of an intracranial mass. No midline shift. Vascular: No hyperdense vessel.  Atherosclerotic calcifications. Skull: No calvarial fracture or aggressive osseous lesion. Sinuses/Orbits: No mass or acute finding within the imaged orbits. Mild mucosal thickening within the bilateral maxillary, right sphenoid and bilateral ethmoid sinuses. Other: Left mastoid effusion. Bilateral mastoid air cell sclerosis. IMPRESSION: 1. Small age-indeterminate infarct within the right cerebellar hemisphere. Consider a brain MRI for further evaluation. 2. Moderate chronic small vessel ischemic changes within the cerebral white matter. 3. Mild paranasal sinus mucosal  thickening. 4. Left mastoid effusion (with bilateral mastoid air cell sclerosis). Electronically Signed   By: Rockey Childs D.O.   On: 10/20/2023 12:17               LOS: 1 day   Shannette Tabares  [facility]   Pager on [email or web address]. If 7PM-7AM, please contact night-coverage at [email or web address]     10/21/2023, 4:00 PM

## 2023-10-22 ENCOUNTER — Inpatient Hospital Stay

## 2023-10-22 DIAGNOSIS — I7090 Unspecified atherosclerosis: Secondary | ICD-10-CM | POA: Diagnosis not present

## 2023-10-22 DIAGNOSIS — R700 Elevated erythrocyte sedimentation rate: Secondary | ICD-10-CM | POA: Diagnosis not present

## 2023-10-22 DIAGNOSIS — R519 Headache, unspecified: Secondary | ICD-10-CM

## 2023-10-22 DIAGNOSIS — I639 Cerebral infarction, unspecified: Secondary | ICD-10-CM | POA: Diagnosis not present

## 2023-10-22 LAB — CBC
HCT: 34.5 % — ABNORMAL LOW (ref 39.0–52.0)
Hemoglobin: 12.6 g/dL — ABNORMAL LOW (ref 13.0–17.0)
MCH: 31.4 pg (ref 26.0–34.0)
MCHC: 36.5 g/dL — ABNORMAL HIGH (ref 30.0–36.0)
MCV: 86 fL (ref 80.0–100.0)
Platelets: 227 K/uL (ref 150–400)
RBC: 4.01 MIL/uL — ABNORMAL LOW (ref 4.22–5.81)
RDW: 12.2 % (ref 11.5–15.5)
WBC: 8.2 K/uL (ref 4.0–10.5)
nRBC: 0 % (ref 0.0–0.2)

## 2023-10-22 LAB — CSF CELL COUNT WITH DIFFERENTIAL
RBC Count, CSF: 288 /mm3 — ABNORMAL HIGH (ref 0–3)
Tube #: 3
WBC, CSF: 8 /mm3 — ABNORMAL HIGH (ref 0–5)

## 2023-10-22 LAB — BASIC METABOLIC PANEL WITH GFR
Anion gap: 10 (ref 5–15)
BUN: 28 mg/dL — ABNORMAL HIGH (ref 8–23)
CO2: 24 mmol/L (ref 22–32)
Calcium: 8.9 mg/dL (ref 8.9–10.3)
Chloride: 101 mmol/L (ref 98–111)
Creatinine, Ser: 2.32 mg/dL — ABNORMAL HIGH (ref 0.61–1.24)
GFR, Estimated: 30 mL/min — ABNORMAL LOW (ref >60)
Glucose, Bld: 130 mg/dL — ABNORMAL HIGH (ref 70–99)
Potassium: 3.9 mmol/L (ref 3.5–5.1)
Sodium: 135 mmol/L (ref 135–145)

## 2023-10-22 LAB — MENINGITIS/ENCEPHALITIS PANEL (CSF)

## 2023-10-22 LAB — URINALYSIS, COMPLETE (UACMP) WITH MICROSCOPIC
Bilirubin Urine: NEGATIVE
Glucose, UA: 50 mg/dL — AB
Hgb urine dipstick: NEGATIVE
Ketones, ur: NEGATIVE mg/dL
Leukocytes,Ua: NEGATIVE
Nitrite: NEGATIVE
Protein, ur: >=300 mg/dL — AB
Specific Gravity, Urine: 1.012 (ref 1.005–1.030)
Squamous Epithelial / HPF: 0 /HPF (ref 0–5)
pH: 6 (ref 5.0–8.0)

## 2023-10-22 LAB — PROTEIN / CREATININE RATIO, URINE
Creatinine, Urine: 97 mg/dL
Protein Creatinine Ratio: 1.99 mg/mg{creat} — ABNORMAL HIGH (ref 0.00–0.15)
Total Protein, Urine: 193 mg/dL

## 2023-10-22 LAB — GLUCOSE, CAPILLARY
Glucose-Capillary: 127 mg/dL — ABNORMAL HIGH (ref 70–99)
Glucose-Capillary: 176 mg/dL — ABNORMAL HIGH (ref 70–99)
Glucose-Capillary: 114 mg/dL — ABNORMAL HIGH (ref 70–99)
Glucose-Capillary: 165 mg/dL — ABNORMAL HIGH (ref 70–99)

## 2023-10-22 LAB — HEPATITIS PANEL, ACUTE
HCV Ab: NONREACTIVE
Hep A IgM: NONREACTIVE
Hep B C IgM: NONREACTIVE
Hepatitis B Surface Ag: NONREACTIVE

## 2023-10-22 LAB — GLUCOSE, CSF: Glucose, CSF: 73 mg/dL — ABNORMAL HIGH (ref 40–70)

## 2023-10-22 LAB — PROTEIN, CSF: Total  Protein, CSF: 68 mg/dL — ABNORMAL HIGH (ref 15–45)

## 2023-10-22 MED ORDER — HYDRALAZINE HCL 20 MG/ML IJ SOLN
10.0000 mg | Freq: Four times a day (QID) | INTRAMUSCULAR | Status: DC | PRN
  Administered 2023-10-24 – 2023-10-25 (×2): 10 mg via INTRAVENOUS
  Filled 2023-10-22 (×2): qty 1

## 2023-10-22 MED ORDER — LIDOCAINE 1 % OPTIME INJ - NO CHARGE
5.0000 mL | Freq: Once | INTRAMUSCULAR | Status: AC
  Administered 2023-10-22: 5 mL via INTRADERMAL
  Filled 2023-10-22: qty 6

## 2023-10-22 MED ORDER — LIDOCAINE-EPINEPHRINE 1 %-1:100000 IJ SOLN
10.0000 mL | Freq: Once | INTRAMUSCULAR | Status: AC
  Administered 2023-10-22: 10 mL via INTRADERMAL
  Filled 2023-10-22: qty 10

## 2023-10-22 MED ORDER — LIDOCAINE-EPINEPHRINE 1 %-1:100000 IJ SOLN
INTRAMUSCULAR | Status: AC
  Filled 2023-10-22: qty 1

## 2023-10-22 MED ORDER — ATORVASTATIN CALCIUM 20 MG PO TABS
80.0000 mg | ORAL_TABLET | Freq: Every day | ORAL | Status: DC
  Administered 2023-10-23 – 2023-10-25 (×3): 80 mg via ORAL
  Filled 2023-10-22 (×3): qty 4

## 2023-10-22 MED ORDER — VERAPAMIL HCL 40 MG PO TABS
40.0000 mg | ORAL_TABLET | Freq: Three times a day (TID) | ORAL | Status: DC
  Administered 2023-10-23 – 2023-10-25 (×7): 40 mg via ORAL
  Filled 2023-10-22 (×8): qty 1

## 2023-10-22 NOTE — Progress Notes (Signed)
 Per Dr Jens, discontinue NIH/stroke orders

## 2023-10-22 NOTE — Evaluation (Addendum)
 Speech Language Pathology Evaluation Patient Details Name: Richard Hunt MRN: [id number] DOB: 11-05-1960 Today's Date: 10/22/2023 Time: 9059-9044 SLP Time Calculation (min) (ACUTE ONLY): 15 min  Problem List:  Patient Active Problem List   Diagnosis Date Noted   CVA (cerebral vascular accident) (HCC) 10/20/2023   GERD (gastroesophageal reflux disease) 10/20/2023   Acute cholecystitis 12/11/2016   Diabetes (HCC) 12/11/2016   HTN (hypertension) 12/11/2016   HLD (hyperlipidemia) 12/11/2016   Chronic venous insufficiency 01/16/2014   Bilateral edema of lower extremity 01/02/2014   Ulcer of calf (HCC) 08/29/2013   Past Medical History:  Past Medical History:  Diagnosis Date   Acute cholecystitis 12/11/2016   Acute kidney injury (nontraumatic) (HCC)    Bilateral edema of lower extremity 01/02/2014   Chronic venous insufficiency 01/16/2014   Diabetes (HCC) 12/11/2016   Diabetes mellitus without complication (HCC)    GERD (gastroesophageal reflux disease)    HLD (hyperlipidemia)    HTN (hypertension) 12/11/2016   Hypercholesterolemia    Hypertension    Osteoarthritis    Polycystic kidney disease    Ulcer of calf (HCC) 08/29/2013   Past Surgical History:  Past Surgical History:  Procedure Laterality Date   CHOLECYSTECTOMY N/A 12/11/2016   Procedure: LAPAROSCOPIC CHOLECYSTECTOMY;  Surgeon: Shelva Dunnings, MD;  Location: [facility] ORS;  Service: General;  Laterality: N/A;   VEIN SURGERY     HPI:  Richard Hunt is a 63 y.o. male with medical history significant hypertension, hyperlipidemia, diabetes mellitus, and venous insufficiency. Presented to [facility] ED 10/20/23 with complaints of severe and chronic headache that has been ongoing for about 2 months. MRI brain was obtained that demonstrated patchy small acute/subacute infarcts scattered within the callosal splenium on the left and within the bilateral parieto-occipital lobes. There is also background age-advanced chronic  small vessel ischemic disease with multiple chronic lacunar infarcts. There were a few nonspedific chronic microhemorrhages within the supratentorial brain. The patient is also found to have an asymmetrically dimuinutive right internal carotid artery suspicious for the presence of a non-visualized upstream stenosis within the neck.   Assessment / Plan / Recommendation Clinical Impression  Pt seen for primarily motor speech evaluation in the setting of acute CVA (multifocal posterior circulation strokes), revealing min dysarthria, with potential verbal apraxia. In person interpreter Nadia) present to aid with completion of session. Assessment consisted of pt interview and dynamic measures. Noted imprecise articulation and low vocal intensity with connected, conversational speech. Pt independently correcting with re-trial and slow production. Articulation errors were inconsistent (x3 for entirety of session). Oral motor function is grossly intact. Question impact of verbal apraxia given inconsistent nature of deficit. Per pt report, his speech and voice are different, and communication partners occasionally request repetition in the setting of communication breakdown. Education shared for external factors to speech production (fatigue, headache) and use of slow, precise articulation for communication repair. Pt reported understanding. No reported concern with cognition- pt alert, oriented, aware of situation and expressive/receptive language is intact.   Overall, given min nature of deficits and pt's independent monitoring and correction- acute SLP services not indicated. Pt notably tearful at end of session, verbal affirmation/reassurance and time provided. Pt reported plan to think about desire for follow up SLP services at time of discharge. Neurologist and hospitalist aware of recommendations/results.      SLP Assessment  SLP Recommendation/Assessment: All further Speech Language Pathology needs  can be addressed in the next venue of care SLP Visit Diagnosis: Dysarthria and anarthria (R47.1);Apraxia (R48.2)  Assistance Recommended at Discharge  PRN  Functional Status Assessment Patient has had a recent decline in their functional status and demonstrates the ability to make significant improvements in function in a reasonable and predictable amount of time.  Frequency and Duration  No acute SLP services indicated- recommend follow up SLP services IF desired by patient.          SLP Evaluation Cognition  Overall Cognitive Status: Within Functional Limits for tasks assessed       Comprehension  Auditory Comprehension Overall Auditory Comprehension: Appears within functional limits for tasks assessed Visual Recognition/Discrimination Discrimination: Within Function Limits (per pt report) Reading Comprehension Reading Status: Not tested    Expression Expression Primary Mode of Expression: Verbal Verbal Expression Overall Verbal Expression: Appears within functional limits for tasks assessed Written Expression Written Expression: Not tested   Oral / Motor  Oral Motor/Sensory Function Overall Oral Motor/Sensory Function: Within functional limits Motor Speech Overall Motor Speech: Impaired Respiration: Within functional limits Phonation: Low vocal intensity (pt reported change in voice) Resonance: Within functional limits Articulation: Impaired Level of Impairment: Conversation Intelligibility: Intelligible Motor Planning: Impaired Level of Impairment: Press photographer Errors: Aware;Inconsistent Interfering Components:  (none) Effective Techniques: Slow rate;Pause;Over-articulate (re-trial)           [place] Brei [place] Clapp, MS, CCC-SLP Speech Language Pathologist Rehab Services; [facility] [phone] ([email or web address])   [place] J Clapp 10/22/2023, 10:28 AM

## 2023-10-22 NOTE — Progress Notes (Addendum)
 Progress Note    Richard Hunt  FMW:[id number] DOB: April 03, 1961  DOA: 10/20/2023 PCP: [facility]      Brief Narrative:    Medical records reviewed and are as summarized below:  Richard Hunt is a 63 y.o. male with medical history significant hypertension, hyperlipidemia, diabetes mellitus, CKD stage IIIb with proteinuria, chronic venous insufficiency, who presented to the hospital because of headache and intermittent dizziness for over 2 months.  He is a known hypertensive urgency and has been medically adherent with medications.  Blood pressure has still been uncontrolled.  Family reported that sometimes his speech has been difficult to understand.   In the ED, BP was 197/140.  MRI brain reviewed by the small acute/subacute infarct scattered within the callosal splenium on the left and within the bilateral parieto-occipital lobes.     Assessment/Plan:   Principal Problem:   CVA (cerebral vascular accident) (HCC) Active Problems:   Diabetes (HCC)   HTN (hypertension)   HLD (hyperlipidemia)   GERD (gastroesophageal reflux disease)   Body mass index is 35.62 kg/m.  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  Neurologist recommended dual antiplatelet therapy with aspirin  and Plavix  for 3 months.   2D echo showed EF estimated at 60 to 65%, moderate concentric LVH, grade 1 diastolic dysfunction and no evidence of interatrial shunt. Lower extremity pain: Venous duplex did not show any evidence of DVT. MRA head and neck showed severe narrowing at different levels of of right ICA, right MCA, right PCA and occlusion of right vertebral artery just proximal to the vertebrobasilar confluence, and severe near complete stenosis of the distal left vertebral artery.SABRA  He was evaluated by ST, PT and OT.  No skilled therapy required. Follow-up with your neurologist for further  recommendations.   Progressive headaches: Unclear if this is related to acute stroke.  Neurologist recommended LP to rule out other potential causes including vasculitis.  S/p lumbar puncture on 10/22/2023 which produced less fluid with opening pressure of 12 cm of water.  CSF studies pending. Amlodipine  switched to verapamil  for hypertension and also to help with headaches.  Discussed with Dr. Jerrie, neurologist.   Hypertensive urgency: Continue verapamil , hydralazine  and hydrochlorothiazide .   Type II DM with hyperglycemia: NovoLog  as needed for hyperglycemia.  Hold glimepiride. Hemoglobin A1c 6.1.   CKD stage IIIb, proteinuria: No AKI.  Consulted nephrologist because of uncontrolled hypertension for months, proteinuria and CKD.   Chart review showed that baseline creatinine between 1.8-2.4. Urine albumin creatinine ratio was 1,974.1 on 08/17/2023.   Hyperlipidemia: Controlled.  LDL 74, HDL 41, triglycerides 113 and total cholesterol 138.  Continue Lipitor        Diet Order             Diet Heart Fluid consistency: Thin  Diet effective now                            Consultants: Neurologist Nephrologist  Procedures: Lumbar puncture by IR on 10/22/2023    Medications:    aspirin   81 mg Oral Daily   [START ON 10/23/2023] atorvastatin   80 mg Oral QHS   clopidogrel   75 mg Oral Daily   gabapentin   100 mg Oral QHS   heparin   5,000 Units Subcutaneous Q8H   hydrALAZINE   75 mg Oral TID   hydrochlorothiazide   25 mg Oral Daily   insulin  aspart  0-15 Units  Subcutaneous TID WC   pantoprazole   40 mg Oral Daily   [START ON 10/23/2023] verapamil   40 mg Oral Q8H   Continuous Infusions:     Anti-infectives (From admission, onward)    None              Family Communication/Anticipated D/C date and plan/Code Status   DVT prophylaxis: heparin  injection 5,000 Units Start: 10/20/23 2200     Code Status: Full Code  Family Communication:  None Disposition Plan: Plan to discharge home   Status is: Inpatient Remains inpatient appropriate because: Acute stroke       Subjective:   Interval events noted.  He still has a headache though it is better.  He feels better today.  Objective:    Vitals:   10/21/23 2321 10/22/23 0341 10/22/23 0824 10/22/23 1222  BP: (!) 164/95 (!) 180/92 (!) 149/88 (!) 187/65  Pulse: 77 78 79 90  Resp: 17 20    Temp: 97.7 F (36.5 C) 98 F (36.7 C) 98 F (36.7 C) 98.1 F (36.7 C)  TempSrc: Oral     SpO2: 99% 98% 100% 100%  Weight:      Height:       No data found.   Intake/Output Summary (Last 24 hours) at 10/22/2023 1340 Last data filed at 10/21/2023 1827 Gross per 24 hour  Intake 602 ml  Output --  Net 602 ml   Filed Weights   10/20/23 1146  Weight: 97.1 kg    Exam:  GEN: NAD SKIN: Warm and dry EYES: No pallor or icterus ENT: MMM CV: RRR PULM: CTA B ABD: soft, ND, NT, +BS CNS: AAO x 3, non focal EXT: No edema or tenderness       Data Reviewed:   I have personally reviewed following labs and imaging studies:  Labs: Labs show the following:   Basic Metabolic Panel: Recent Labs  Lab 10/20/23 1300 10/22/23 0520  NA 140 135  K 3.9 3.9  CL 108 101  CO2 23 24  GLUCOSE 129* 130*  BUN 34* 28*  CREATININE 2.22* 2.32*  CALCIUM  8.7* 8.9   GFR Estimated Creatinine Clearance: 34 mL/min (A) (by C-G formula based on SCr of 2.32 mg/dL (H)). Liver Function Tests: No results for input(s): AST, ALT, ALKPHOS, BILITOT, PROT, ALBUMIN in the last 168 hours. No results for input(s): LIPASE, AMYLASE in the last 168 hours. No results for input(s): AMMONIA in the last 168 hours. Coagulation profile Recent Labs  Lab 10/20/23 1548  INR 1.0    CBC: Recent Labs  Lab 10/20/23 1300 10/22/23 0520  WBC 10.0 8.2  NEUTROABS 7.0  --   HGB 12.4* 12.6*  HCT 34.7* 34.5*  MCV 87.0 86.0  PLT 235 227   Cardiac Enzymes: No results for input(s):  CKTOTAL, CKMB, CKMBINDEX, TROPONINI in the last 168 hours. BNP (last 3 results) No results for input(s): PROBNP in the last 8760 hours. CBG: Recent Labs  Lab 10/21/23 1348 10/21/23 1613 10/21/23 2157 10/22/23 0825 10/22/23 1224  GLUCAP 207* 81 191* 127* 176*   D-Dimer: No results for input(s): DDIMER in the last 72 hours. Hgb A1c: Recent Labs    10/20/23 1300  HGBA1C 6.1*   Lipid Profile: Recent Labs    10/21/23 0455  CHOL 138  HDL 41  LDLCALC 74  TRIG 113  CHOLHDL 3.4   Thyroid function studies: No results for input(s): TSH, T4TOTAL, T3FREE, THYROIDAB in the last 72 hours.  Invalid input(s): FREET3 Anemia work up:  No results for input(s): VITAMINB12, FOLATE, FERRITIN, TIBC, IRON, RETICCTPCT in the last 72 hours. Sepsis Labs: Recent Labs  Lab 10/20/23 1300 10/22/23 0520  WBC 10.0 8.2    Microbiology No results found for this or any previous visit (from the past 240 hours).  Procedures and diagnostic studies:  US  RENAL Result Date: 10/22/2023 CLINICAL DATA:  Acute renal failure EXAM: RENAL / URINARY TRACT ULTRASOUND COMPLETE COMPARISON:  12/10/2016 CT FINDINGS: Right Kidney: Renal measurements: 11.1 x 5.5 x 5.3 cm = volume: 168 mL. Echogenicity within normal limits. No mass or hydronephrosis visualized. Left Kidney: Renal measurements: 9.5 x 4.2 x 3.8 cm = volume: 80 mL. Trace left perinephric edema/fluid, nonspecific. Normal echogenicity. No hydronephrosis. Renal cysts of up to 1.7 cm. Bladder: Decompressed Other: None. IMPRESSION: Mild left renal atrophy. No hydronephrosis or other explanation for renal failure. Electronically Signed   By: Rockey Kilts M.D.   On: 10/22/2023 10:54   US  Venous Img Lower Bilateral (DVT) Result Date: 10/21/2023 CLINICAL DATA:  Leg pain.  Stroke EXAM: BILATERAL LOWER EXTREMITY VENOUS DOPPLER ULTRASOUND TECHNIQUE: Gray-scale sonography with graded compression, as well as color Doppler and duplex  ultrasound were performed to evaluate the lower extremity deep venous systems from the level of the common femoral vein and including the common femoral, femoral, profunda femoral, popliteal and calf veins including the posterior tibial, peroneal and gastrocnemius veins when visible. The superficial great saphenous vein was also interrogated. Spectral Doppler was utilized to evaluate flow at rest and with distal augmentation maneuvers in the common femoral, femoral and popliteal veins. COMPARISON:  Left lower extremity DVT ultrasound 12/20/2016 FINDINGS: RIGHT LOWER EXTREMITY Common Femoral Vein: No evidence of thrombus. Normal compressibility, respiratory phasicity and response to augmentation. Saphenofemoral Junction: No evidence of thrombus. Normal compressibility and flow on color Doppler imaging. Profunda Femoral Vein: No evidence of thrombus. Normal compressibility and flow on color Doppler imaging. Femoral Vein: No evidence of thrombus. Normal compressibility, respiratory phasicity and response to augmentation. Popliteal Vein: No evidence of thrombus. Normal compressibility, respiratory phasicity and response to augmentation. Calf Veins: No evidence of thrombus. Normal compressibility and flow on color Doppler imaging. Superficial Great Saphenous Vein: No evidence of thrombus. Normal compressibility. Venous Reflux:  None. Other Findings: Laterally there is a oblong fluid collection measuring 3.0 x 0.6 cm at the level of the knee. LEFT LOWER EXTREMITY Common Femoral Vein: No evidence of thrombus. Normal compressibility, respiratory phasicity and response to augmentation. Saphenofemoral Junction: No evidence of thrombus. Normal compressibility and flow on color Doppler imaging. Profunda Femoral Vein: No evidence of thrombus. Normal compressibility and flow on color Doppler imaging. Femoral Vein: No evidence of thrombus. Normal compressibility, respiratory phasicity and response to augmentation. Popliteal Vein:  No evidence of thrombus. Normal compressibility, respiratory phasicity and response to augmentation. Calf Veins: No evidence of thrombus. Normal compressibility and flow on color Doppler imaging. Superficial Great Saphenous Vein: No evidence of thrombus. Normal compressibility. Venous Reflux:  None. Other Findings:  None. IMPRESSION: No evidence of deep venous thrombosis in either lower extremity. Oblong fluid collection seen lateral to the right knee. Please correlate for any known history or dedicated workup when appropriate. Please correlate for any prior history or of infection or trauma. Electronically Signed   By: Ranell Bring M.D.   On: 10/21/2023 15:19   ECHOCARDIOGRAM COMPLETE Result Date: 10/21/2023    ECHOCARDIOGRAM REPORT   Patient Name:   Richard Hunt Date of Exam: 10/21/2023 Medical Rec #:  [id number]  Height:       65.0 in Accession #:    [id number]              Weight:       214.1 lb Date of Birth:  10-03-1960               BSA:          2.036 m Patient Age:    63 years                BP:           177/94 mmHg Patient Gender: M                       HR:           79 bpm. Exam Location:  [facility] Procedure: 2D Echo, Cardiac Doppler, Color Doppler, Strain Analysis and Saline            Contrast Bubble Study (Both Spectral and Color Flow Doppler were            utilized during procedure). Indications:     Stroke I63.9  History:         Patient has no prior history of Echocardiogram examinations.  Sonographer:     Thedora Louder RDCS, FASE Referring Phys:  [id number] BRIGIDA BUREAU Diagnosing Phys: Cara JONETTA Lovelace MD  Sonographer Comments: Global longitudinal strain was attempted. IMPRESSIONS  1. Left ventricular ejection fraction, by estimation, is 60 to 65%. The left ventricle has normal function. The left ventricle has no regional wall motion abnormalities. The left ventricular internal cavity size was mildly dilated. There is moderate concentric left ventricular hypertrophy. Left  ventricular diastolic parameters are consistent with Grade I diastolic dysfunction (impaired relaxation). The average left ventricular global longitudinal strain is 16.5 %. The global longitudinal strain is normal.  2. Right ventricular systolic function is normal. The right ventricular size is normal.  3. Left atrial size was mildly dilated.  4. The mitral valve is normal in structure. Mild mitral valve regurgitation.  5. The aortic valve is normal in structure. Aortic valve regurgitation is not visualized. Aortic valve sclerosis/calcification is present, without any evidence of aortic stenosis. FINDINGS  Left Ventricle: Left ventricular ejection fraction, by estimation, is 60 to 65%. The left ventricle has normal function. The left ventricle has no regional wall motion abnormalities. The average left ventricular global longitudinal strain is 16.5 %. Strain was performed and the global longitudinal strain is normal. The left ventricular internal cavity size was mildly dilated. There is moderate concentric left ventricular hypertrophy. Left ventricular diastolic parameters are consistent with Grade I diastolic dysfunction (impaired relaxation). Right Ventricle: The right ventricular size is normal. No increase in right ventricular wall thickness. Right ventricular systolic function is normal. Left Atrium: Left atrial size was mildly dilated. Right Atrium: Right atrial size was normal in size. Pericardium: There is no evidence of pericardial effusion. Mitral Valve: The mitral valve is normal in structure. Mild mitral valve regurgitation. Tricuspid Valve: The tricuspid valve is normal in structure. Tricuspid valve regurgitation is mild. Aortic Valve: The aortic valve is normal in structure. Aortic valve regurgitation is not visualized. Aortic valve sclerosis/calcification is present, without any evidence of aortic stenosis. Aortic valve peak gradient measures 10.9 mmHg. Pulmonic Valve: The pulmonic valve was grossly  normal. Pulmonic valve regurgitation is not visualized. Aorta: The ascending aorta was not well visualized. IAS/Shunts: No atrial level shunt detected by color flow Doppler. Agitated saline  contrast was given intravenously to evaluate for intracardiac shunting. Additional Comments: 3D was performed not requiring image post processing on an independent workstation and was indeterminate.  LEFT VENTRICLE PLAX 2D LVIDd:         5.20 cm   Diastology LVIDs:         3.50 cm   LV e' medial:    5.55 cm/s LV PW:         1.60 cm   LV E/e' medial:  14.8 LV IVS:        1.50 cm   LV e' lateral:   6.42 cm/s LVOT diam:     2.10 cm   LV E/e' lateral: 12.8 LV SV:         73 LV SV Index:   36        2D Longitudinal Strain LVOT Area:     3.46 cm  2D Strain GLS (A4C):   13.8 %                          2D Strain GLS (A3C):   20.2 %                          2D Strain GLS (A2C):   15.4 %                          2D Strain GLS Avg:     16.5 % RIGHT VENTRICLE RV Basal diam:  3.10 cm RV S prime:     15.90 cm/s TAPSE (M-mode): 2.0 cm LEFT ATRIUM             Index        RIGHT ATRIUM           Index LA diam:        4.30 cm 2.11 cm/m   RA Area:     11.50 cm LA Vol (A2C):   24.4 ml 11.98 ml/m  RA Volume:   28.00 ml  13.75 ml/m LA Vol (A4C):   37.9 ml 18.61 ml/m LA Biplane Vol: 32.7 ml 16.06 ml/m  AORTIC VALVE                 PULMONIC VALVE AV Area (Vmax): 2.33 cm     PV Vmax:        1.22 m/s AV Vmax:        165.00 cm/s  PV Peak grad:   6.0 mmHg AV Peak Grad:   10.9 mmHg    RVOT Peak grad: 5 mmHg LVOT Vmax:      111.00 cm/s LVOT Vmean:     68.700 cm/s LVOT VTI:       0.210 m  AORTA Ao Root diam: 3.90 cm Ao Asc diam:  3.30 cm MITRAL VALVE MV Area (PHT): 3.89 cm     SHUNTS MV Decel Time: 195 msec     Systemic VTI:  0.21 m MV E velocity: 82.30 cm/s   Systemic Diam: 2.10 cm MV A velocity: 104.00 cm/s MV E/A ratio:  0.79 Dwayne D Callwood MD Electronically signed by Cara JONETTA Lovelace MD Signature Date/Time: 10/21/2023/2:17:33 PM    Final     MR ANGIO HEAD WO CONTRAST Result Date: 10/20/2023 CLINICAL DATA:  Stroke/TIA EXAM: MRA NECK WITHOUT AND WITH CONTRAST MRA HEAD WITHOUT CONTRAST TECHNIQUE: Multiplanar and multiecho pulse sequences of the neck were obtained without and with  intravenous contrast. Angiographic images of the neck were obtained using MRA technique without and with intravenous contrast.; Angiographic images of the Circle of Willis were obtained using MRA technique without intravenous contrast. CONTRAST:  10mL GADAVIST  GADOBUTROL  1 MMOL/ML IV SOLN COMPARISON:  None Available. FINDINGS: MRA NECK FINDINGS Aortic arch: There is a normal variant aortic arch branching pattern with the brachiocephalic and left common carotid arteries sharing a common origin. Right carotid system: The right ICA is severely narrowed at its origin (with focal poststenotic dilatation of the proximal ICA. The distal ICA is somewhat irregular with undulation and multifocal stenosis. The right ICA terminus appears occluded past the ophthalmic segment. Left carotid system: Unremarkable Vertebral arteries: Patent with antegrade flow. Other: None. MRA HEAD FINDINGS POSTERIOR CIRCULATION: The right vertebral artery is occluded just proximal to the vertebrobasilar confluence. There is severe, near complete stenosis of the distal left vertebral artery. Basilar artery is patent and normal caliber. Superior cerebellar arteries are normal. Severe stenosis of the right PCA P2 segment. Narrowing of the distal P3 segment of the left PCA. ANTERIOR CIRCULATION: Poor flow related enhancement of the right internal carotid artery. The petrous segment is markedly narrowed and the lacerum and proximal cavernous segments are likely occluded. The terminus also appears occluded. Unremarkable left ICA. Anterior cerebral arteries are normal. 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 Normal left MCA. Anatomic Variants: None IMPRESSION: 1. Severe  narrowing of the right ICA at its origin with poststenotic dilatation. 2. The right ICA terminus appears occluded past the ophthalmic segment. 3. 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 4. Severe stenosis of the right PCA P2 segment. 5. 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 6. Severe, near complete stenosis of the distal left vertebral artery. Electronically Signed   By: Franky Stanford M.D.   On: 10/20/2023 23:47   MR ANGIO NECK W WO CONTRAST Result Date: 10/20/2023 CLINICAL DATA:  Stroke/TIA EXAM: MRA NECK WITHOUT AND WITH CONTRAST MRA HEAD WITHOUT CONTRAST TECHNIQUE: Multiplanar and multiecho pulse sequences of the neck were obtained without and with intravenous contrast. Angiographic images of the neck were obtained using MRA technique without and with intravenous contrast.; Angiographic images of the Circle of Willis were obtained using MRA technique without intravenous contrast. CONTRAST:  10mL GADAVIST  GADOBUTROL  1 MMOL/ML IV SOLN COMPARISON:  None Available. FINDINGS: MRA NECK FINDINGS Aortic arch: There is a normal variant aortic arch branching pattern with the brachiocephalic and left common carotid arteries sharing a common origin. Right carotid system: The right ICA is severely narrowed at its origin (with focal poststenotic dilatation of the proximal ICA. The distal ICA is somewhat irregular with undulation and multifocal stenosis. The right ICA terminus appears occluded past the ophthalmic segment. Left carotid system: Unremarkable Vertebral arteries: Patent with antegrade flow. Other: None. MRA HEAD FINDINGS POSTERIOR CIRCULATION: The right vertebral artery is occluded just proximal to the vertebrobasilar confluence. There is severe, near complete stenosis of the distal left vertebral artery. Basilar artery is patent and normal caliber. Superior cerebellar arteries are normal. Severe stenosis of the right PCA  P2 segment. Narrowing of the distal P3 segment of the left PCA. ANTERIOR CIRCULATION: Poor flow related enhancement of the right internal carotid artery. The petrous segment is markedly narrowed and the lacerum and proximal cavernous segments are likely occluded. The terminus also appears occluded. Unremarkable left ICA. Anterior cerebral arteries are normal. Severe stenosis of  the right MCA proximal M1 segment with normal distal patency. Normal left MCA. Anatomic Variants: None IMPRESSION: 1. 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 2. The right ICA terminus appears occluded past the ophthalmic segment. 3. 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 4. Severe stenosis of the right PCA P2 segment. 5. 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 6. Severe, near complete stenosis of the distal left vertebral artery. Electronically Signed   By: Franky Stanford M.D.   On: 10/20/2023 23:47   MR BRAIN WO CONTRAST Result Date: 10/20/2023 CLINICAL DATA:  Provided history: Neuro deficit, acute, stroke suspected. EXAM: MRI HEAD WITHOUT CONTRAST TECHNIQUE: Multiplanar, multiecho pulse sequences of the brain and surrounding structures were obtained without intravenous contrast. COMPARISON:  Head CT 10/20/2023. FINDINGS: Brain: No age-advanced or lobar predominant cerebral atrophy. Patchy small acute/subacute infarcts scattered within the callosal splenium on the left and within the bilateral parietooccipital lobes. Chronic lacunar infarcts within/about the bilateral deep gray nuclei. Moderate multifocal T2 FLAIR hyperintense signal abnormality elsewhere the cerebral white matter, nonspecific but compatible chronic small vessel ischemic disease. Small chronic lacunar infarct within the right middle cerebellar peduncle. Few nonspecific chronic microhemorrhages scattered within the supratentorial brain. No  evidence of an intracranial mass. No extra-axial fluid collection. No midline shift. Vascular: Asymmetrically diminutive right internal carotid artery at the imaged levels suspicious for the presence of a nonvisualized upstream (more proximal) stenosis within the neck. Flow voids preserved elsewhere within the proximal large arterial vessels. Skull and upper cervical spine: No focal worrisome marrow lesion. Sinuses/Orbits: No mass or acute finding within the imaged orbits. Minimal mucosal thickening within the bilateral maxillary, sphenoid and ethmoid sinuses. Other: Trace fluid within left mastoid air cells. IMPRESSION: 1. Patchy small acute/subacute infarcts scattered within the callosal splenium on the left, and within the bilateral parietooccipital lobes. 2. Background age-advanced chronic small vessel ischemic disease with multiple chronic lacunar infarcts, as described. 3. Few nonspecific chronic microhemorrhages within the supratentorial brain. 4. Asymmetrically diminutive right internal carotid artery at the imaged levels suspicious for the presence of a non-visualized upstream (more proximal) stenosis within the neck. Consider CT or MR angiography further evaluation. 5. Minor paranasal sinus mucosal thickening. 6. Trace fluid within left mastoid air cells. Electronically Signed   By: Rockey Childs D.O.   On: 10/20/2023 15:33               LOS: 2 days   Lasundra Hascall  [facility]   Pager on [email or web address]. If 7PM-7AM, please contact night-coverage at [email or web address]     10/22/2023, 1:40 PM

## 2023-10-22 NOTE — Progress Notes (Signed)
 [facility]  CONSULT NOTE    Date: 10/22/2023                  Patient Name:  Richard Hunt  MRN: [id number]  DOB: 10/01/1960  Age / Sex: 63 y.o., male         PCP: Cletus Glenn                 Service Requesting Consult: TRH                 Reason for Consult: Acute kidney injury            History of Present Illness: [name] is a 63 y.o.  male with past medical conditions including diabetes, hypertension, GERD, osteoarthritis, polycystic kidney disease, who was admitted to [facility] on 10/20/2023 for CVA (cerebral vascular accident) [facility]) [I63.9] Cerebrovascular accident (CVA), unspecified mechanism (HCC) [I63.9]  Patient presents to the emergency department complaining of a severe headache that has lasted for about 2 months.  Also reports some dizziness.  Does have history of hypertension for which he takes medications for.  Patient states he has been diabetic for roughly 10 years.  Labs on ED arrival concerning for creatinine 2.22 with GFR 32, hemoglobin 12.4, hemoglobin A1c 6.1.  UDS negative.  Renal ultrasound shows a mild left renal atrophy without obstruction.  CT head shows a small infarct, age-indeterminate.  Medications: Outpatient medications: Medications Prior to Admission  Medication Sig Dispense Refill Last Dose/Taking   amLODipine  (NORVASC ) 10 MG tablet Take 5-10 mg by mouth daily.   10/19/2023   atorvastatin  (LIPITOR ) 40 MG tablet Take 40 mg by mouth daily.   Taking   gabapentin  (NEURONTIN ) 100 MG capsule Take 100 mg by mouth at bedtime.   Taking   hydrALAZINE  (APRESOLINE ) 50 MG tablet Take 75 mg by mouth 3 (three) times daily.   Taking   hydrochlorothiazide  (HYDRODIURIL ) 25 MG tablet Take 25 mg by mouth daily.   Taking   omeprazole (PRILOSEC) 40 MG capsule Take 40 mg by mouth.   Taking   amLODipine  (NORVASC ) 5 MG tablet Take 1 tablet (5 mg total) by mouth daily. (Patient not taking: Reported on 10/21/2023) 30  tablet 3 Not Taking   glipiZIDE  (GLUCOTROL ) 5 MG tablet Take 1 tablet (5 mg total) by mouth 2 (two) times daily before a meal. 60 tablet 0    meloxicam (MOBIC) 15 MG tablet Take 1 tablet by mouth daily. (Patient not taking: Reported on 10/21/2023)  2 Not Taking   ondansetron  (ZOFRAN -ODT) 4 MG disintegrating tablet DISOLVER UNA TABLETA EN LA LENGUA CADA OCHO HORAS SEGUN SEA NECESARIO PARA NAUSEAS Y VOMITOS (Patient not taking: Reported on 10/21/2023)  0 Not Taking    Current medications: Current Facility-Administered Medications  Medication Dose Route Frequency Provider Last Rate Last Admin   acetaminophen  (TYLENOL ) tablet 650 mg  650 mg Oral Q4H PRN Swayze, Ava, DO   650 mg at 10/22/23 0849   Or   acetaminophen  (TYLENOL ) 160 MG/5ML solution 650 mg  650 mg Per Tube Q4H PRN Swayze, Ava, DO       Or   acetaminophen  (TYLENOL ) suppository 650 mg  650 mg Rectal Q4H PRN Swayze, Ava, DO       aspirin  chewable tablet 81 mg  81 mg Oral Daily Swayze, Ava, DO   81 mg at 10/22/23 0842   [START ON 10/23/2023] atorvastatin  (LIPITOR ) tablet 80 mg  80 mg Oral QHS Bhagat, Srishti L,  MD       clopidogrel  (PLAVIX ) tablet 75 mg  75 mg Oral Daily Swayze, Ava, DO   75 mg at 10/22/23 0850   gabapentin  (NEURONTIN ) capsule 100 mg  100 mg Oral QHS Jens Durand, MD   100 mg at 10/21/23 2141   heparin  injection 5,000 Units  5,000 Units Subcutaneous Q8H Swayze, Ava, DO   5,000 Units at 10/22/23 1358   hydrALAZINE  (APRESOLINE ) tablet 75 mg  75 mg Oral TID Jens Durand, MD   75 mg at 10/22/23 9158   hydrochlorothiazide  (HYDRODIURIL ) tablet 25 mg  25 mg Oral Daily Jens Durand, MD   25 mg at 10/22/23 9157   insulin  aspart (novoLOG ) injection 0-15 Units  0-15 Units Subcutaneous TID WC Swayze, Ava, DO   3 Units at 10/22/23 1357   LORazepam  (ATIVAN ) tablet 0.5 mg  0.5 mg Oral Once PRN Swayze, Ava, DO       Oral care mouth rinse  15 mL Mouth Rinse PRN Swayze, Ava, DO       pantoprazole  (PROTONIX ) EC tablet 40 mg  40 mg Oral  Daily Swayze, Ava, DO   40 mg at 10/22/23 0841   senna-docusate (Senokot-S) tablet 1 tablet  1 tablet Oral QHS PRN Swayze, Ava, DO       traMADol  (ULTRAM ) tablet 50 mg  50 mg Oral Q8H PRN Jens Durand, MD       NOREEN ON 10/23/2023] verapamil  (CALAN ) tablet 40 mg  40 mg Oral Q8H Bhagat, Srishti L, MD          Allergies: No Known Allergies    Past Medical History: Past Medical History:  Diagnosis Date   Acute cholecystitis 12/11/2016   Acute kidney injury (nontraumatic) (HCC)    Bilateral edema of lower extremity 01/02/2014   Chronic venous insufficiency 01/16/2014   Diabetes (HCC) 12/11/2016   Diabetes mellitus without complication (HCC)    GERD (gastroesophageal reflux disease)    HLD (hyperlipidemia)    HTN (hypertension) 12/11/2016   Hypercholesterolemia    Hypertension    Osteoarthritis    Polycystic kidney disease    Ulcer of calf (HCC) 08/29/2013     Past Surgical History: Past Surgical History:  Procedure Laterality Date   CHOLECYSTECTOMY N/A 12/11/2016   Procedure: LAPAROSCOPIC CHOLECYSTECTOMY;  Surgeon: Shelva Dunnings, MD;  Location: [facility] ORS;  Service: General;  Laterality: N/A;   VEIN SURGERY       Family History: Family History  Problem Relation Age of Onset   Diabetes Mother      Social History: Social History   Socioeconomic History   Marital status: Married    Spouse name: Not on file   Number of children: Not on file   Years of education: Not on file   Highest education level: Not on file  Occupational History   Not on file  Tobacco Use   Smoking status: Never   Smokeless tobacco: Never  Vaping Use   Vaping status: Never Used  Substance and Sexual Activity   Alcohol use: No   Drug use: No   Sexual activity: Not on file  Other Topics Concern   Not on file  Social History Narrative   Not on file   Social Drivers of Health   Financial Resource Strain: Medium Risk (10/09/2023)   Received from [facility]   Overall  Financial Resource Strain (CARDIA)    Difficulty of Paying Living Expenses: Somewhat hard  Food Insecurity: No Food Insecurity (10/20/2023)   Hunger  Vital Sign    Worried About Programme researcher, broadcasting/film/video in the Last Year: Never true    Ran Out of Food in the Last Year: Never true  Recent Concern: Food Insecurity - Food Insecurity Present (10/09/2023)   Received from [facility]   Hunger Vital Sign    Within the past 12 months, you worried that your food would run out before you got the money to buy more.: Sometimes true    Within the past 12 months, the food you bought just didn't last and you didn't have money to get more.: Sometimes true  Transportation Needs: No Transportation Needs (10/20/2023)   PRAPARE - Administrator, Civil Service (Medical): No    Lack of Transportation (Non-Medical): No  Physical Activity: Not on file  Stress: Not on file  Social Connections: Patient Declined (10/20/2023)   Social Connection and Isolation Panel    Frequency of Communication with Friends and Family: Patient declined    Frequency of Social Gatherings with Friends and Family: Patient declined    Attends Religious Services: Patient declined    Database administrator or Organizations: Patient declined    Attends Banker Meetings: Patient declined    Marital Status: Patient declined  Intimate Partner Violence: Not At Risk (10/20/2023)   Humiliation, Afraid, Rape, and Kick questionnaire    Fear of Current or Ex-Partner: No    Emotionally Abused: No    Physically Abused: No    Sexually Abused: No     Review of Systems: Review of Systems  Constitutional:  Negative for chills, fever and malaise/fatigue.  HENT:  Negative for congestion, sore throat and tinnitus.   Eyes:  Negative for blurred vision and redness.  Respiratory:  Negative for cough, shortness of breath and wheezing.   Cardiovascular:  Negative for chest pain, palpitations, claudication and leg swelling.   Gastrointestinal:  Negative for abdominal pain, blood in stool, diarrhea, nausea and vomiting.  Genitourinary:  Negative for flank pain, frequency and hematuria.  Musculoskeletal:  Negative for back pain, falls and myalgias.  Skin:  Negative for rash.  Neurological:  Positive for headaches. Negative for dizziness and weakness.  Endo/Heme/Allergies:  Does not bruise/bleed easily.  Psychiatric/Behavioral:  Negative for depression. The patient is not nervous/anxious and does not have insomnia.     Vital Signs: Blood pressure (!) 174/84, pulse 78, temperature 97.8 F (36.6 C), temperature source Oral, resp. rate 20, height 5' 5 (1.651 m), weight 97.1 kg, SpO2 100%.  Weight trends: Filed Weights   10/20/23 1146  Weight: 97.1 kg    Physical Exam: General: NAD  Head: Normocephalic, atraumatic. Moist oral mucosal membranes  Eyes: Anicteric  Neck: Supple  Lungs:  Clear to auscultation, normal effort  Heart: Regular rate and rhythm  Abdomen:  Soft, nontender, nondistended  Extremities: No peripheral edema.  Neurologic: Awake and alert  Skin: No lesions  Access: None     Lab results: Basic Metabolic Panel: Recent Labs  Lab 10/20/23 1300 10/22/23 0520  NA 140 135  K 3.9 3.9  CL 108 101  CO2 23 24  GLUCOSE 129* 130*  BUN 34* 28*  CREATININE 2.22* 2.32*  CALCIUM  8.7* 8.9    Liver Function Tests: No results for input(s): AST, ALT, ALKPHOS, BILITOT, PROT, ALBUMIN in the last 168 hours. No results for input(s): LIPASE, AMYLASE in the last 168 hours. No results for input(s): AMMONIA in the last 168 hours.  CBC: Recent Labs  Lab 10/20/23  1300 10/22/23 0520  WBC 10.0 8.2  NEUTROABS 7.0  --   HGB 12.4* 12.6*  HCT 34.7* 34.5*  MCV 87.0 86.0  PLT 235 227    Cardiac Enzymes: No results for input(s): CKTOTAL, CKMB, CKMBINDEX, TROPONINI in the last 168 hours.  BNP: Invalid input(s): POCBNP  CBG: Recent Labs  Lab 10/21/23 1348  10/21/23 1613 10/21/23 2157 10/22/23 0825 10/22/23 1224  GLUCAP 207* 81 191* 127* 176*    Microbiology: Results for orders placed or performed during the hospital encounter of 10/20/23  CSF culture w Gram Stain     Status: None (Preliminary result)   Collection Time: 10/22/23  1:36 PM   Specimen: PATH Cytology CSF; Cerebrospinal Fluid  Result Value Ref Range Status   Specimen Description CSF  Final   Special Requests NONE  Final   Gram Stain   Final    RED BLOOD CELLS PRESENT WBC SEEN NO ORGANISMS SEEN Performed at [facility], [address] [place], [place] [address]    Culture PENDING  Incomplete   Report Status PENDING  Incomplete    Coagulation Studies: Recent Labs    10/20/23 1548  LABPROT 13.3  INR 1.0    Urinalysis: No results for input(s): COLORURINE, LABSPEC, PHURINE, GLUCOSEU, HGBUR, BILIRUBINUR, KETONESUR, PROTEINUR, UROBILINOGEN, NITRITE, LEUKOCYTESUR in the last 72 hours.  Invalid input(s): APPERANCEUR    Imaging: DG FL GUIDED LUMBAR PUNCTURE Result Date: 10/22/2023 CLINICAL DATA:  63 year old male who presented to the ED with headaches, blurry vision, and unsteadiness on his feet. Reports increasing frequency and severity of headaches over the past few months. Imaging significant for small-age indeterminate infarct in the right cerebellar hemisphere and few nonspecific chronic microhemorrhages. Request for lumbar puncture to rule out additional underlying processes. EXAM: LUMBAR PUNCTURE UNDER FLUOROSCOPY PROCEDURE: An appropriate skin entry site was determined fluoroscopically. Operator donned sterile gloves and mask. Skin site was marked, then prepped with Betadine, draped in usual sterile fashion, and infiltrated locally with 1% lidocaine . A 5-inch 22 gauge spinal needle advanced into the thecal sac at L3-4 from a left interlaminar approach. Clear colorless CSF spontaneously returned, with opening pressure of 12 cm water.  7 ml CSF were collected and divided among 4 sterile vials for the requested laboratory studies. The needle was then removed. The patient tolerated the procedure well and there were no complications. FLUOROSCOPY: Radiation Exposure Index (as provided by the fluoroscopic device): 44.5 mGy Kerma IMPRESSION: Technically successful lumbar puncture under fluoroscopy. This exam was performed by [facility] PA-C, and was supervised and interpreted by Dr. ONEIDA Specking. Electronically Signed   By: CHRISTELLA.  Shick M.D.   On: 10/22/2023 13:55   US  RENAL Result Date: 10/22/2023 CLINICAL DATA:  Acute renal failure EXAM: RENAL / URINARY TRACT ULTRASOUND COMPLETE COMPARISON:  12/10/2016 CT FINDINGS: Right Kidney: Renal measurements: 11.1 x 5.5 x 5.3 cm = volume: 168 mL. Echogenicity within normal limits. No mass or hydronephrosis visualized. Left Kidney: Renal measurements: 9.5 x 4.2 x 3.8 cm = volume: 80 mL. Trace left perinephric edema/fluid, nonspecific. Normal echogenicity. No hydronephrosis. Renal cysts of up to 1.7 cm. Bladder: Decompressed Other: None. IMPRESSION: Mild left renal atrophy. No hydronephrosis or other explanation for renal failure. Electronically Signed   By: Rockey Kilts M.D.   On: 10/22/2023 10:54   US  Venous Img Lower Bilateral (DVT) Result Date: 10/21/2023 CLINICAL DATA:  Leg pain.  Stroke EXAM: BILATERAL LOWER EXTREMITY VENOUS DOPPLER ULTRASOUND TECHNIQUE: Gray-scale sonography with graded compression, as well as color Doppler  and duplex ultrasound were performed to evaluate the lower extremity deep venous systems from the level of the common femoral vein and including the common femoral, femoral, profunda femoral, popliteal and calf veins including the posterior tibial, peroneal and gastrocnemius veins when visible. The superficial great saphenous vein was also interrogated. Spectral Doppler was utilized to evaluate flow at rest and with distal augmentation maneuvers in the common femoral, femoral and  popliteal veins. COMPARISON:  Left lower extremity DVT ultrasound 12/20/2016 FINDINGS: RIGHT LOWER EXTREMITY Common Femoral Vein: No evidence of thrombus. Normal compressibility, respiratory phasicity and response to augmentation. Saphenofemoral Junction: No evidence of thrombus. Normal compressibility and flow on color Doppler imaging. Profunda Femoral Vein: No evidence of thrombus. Normal compressibility and flow on color Doppler imaging. Femoral Vein: No evidence of thrombus. Normal compressibility, respiratory phasicity and response to augmentation. Popliteal Vein: No evidence of thrombus. Normal compressibility, respiratory phasicity and response to augmentation. Calf Veins: No evidence of thrombus. Normal compressibility and flow on color Doppler imaging. Superficial Great Saphenous Vein: No evidence of thrombus. Normal compressibility. Venous Reflux:  None. Other Findings: Laterally there is a oblong fluid collection measuring 3.0 x 0.6 cm at the level of the knee. LEFT LOWER EXTREMITY Common Femoral Vein: No evidence of thrombus. Normal compressibility, respiratory phasicity and response to augmentation. Saphenofemoral Junction: No evidence of thrombus. Normal compressibility and flow on color Doppler imaging. Profunda Femoral Vein: No evidence of thrombus. Normal compressibility and flow on color Doppler imaging. Femoral Vein: No evidence of thrombus. Normal compressibility, respiratory phasicity and response to augmentation. Popliteal Vein: No evidence of thrombus. Normal compressibility, respiratory phasicity and response to augmentation. Calf Veins: No evidence of thrombus. Normal compressibility and flow on color Doppler imaging. Superficial Great Saphenous Vein: No evidence of thrombus. Normal compressibility. Venous Reflux:  None. Other Findings:  None. IMPRESSION: No evidence of deep venous thrombosis in either lower extremity. Oblong fluid collection seen lateral to the right knee. Please correlate  for any known history or dedicated workup when appropriate. Please correlate for any prior history or of infection or trauma. Electronically Signed   By: Ranell Bring M.D.   On: 10/21/2023 15:19   ECHOCARDIOGRAM COMPLETE Result Date: 10/21/2023    ECHOCARDIOGRAM REPORT   Patient Name:   CHRISTROPHER GINTZ Date of Exam: 10/21/2023 Medical Rec #:  [id number]               Height:       65.0 in Accession #:    [id number]              Weight:       214.1 lb Date of Birth:  11/30/1960               BSA:          2.036 m Patient Age:    63 years                BP:           177/94 mmHg Patient Gender: M                       HR:           79 bpm. Exam Location:  [facility] Procedure: 2D Echo, Cardiac Doppler, Color Doppler, Strain Analysis and Saline            Contrast Bubble Study (Both Spectral and Color Flow Doppler were  utilized during procedure). Indications:     Stroke I63.9  History:         Patient has no prior history of Echocardiogram examinations.  Sonographer:     Thedora Louder RDCS, FASE Referring Phys:  [id number] BRIGIDA BUREAU Diagnosing Phys: Cara JONETTA Lovelace MD  Sonographer Comments: Global longitudinal strain was attempted. IMPRESSIONS  1. Left ventricular ejection fraction, by estimation, is 60 to 65%. The left ventricle has normal function. The left ventricle has no regional wall motion abnormalities. The left ventricular internal cavity size was mildly dilated. There is moderate concentric left ventricular hypertrophy. Left ventricular diastolic parameters are consistent with Grade I diastolic dysfunction (impaired relaxation). The average left ventricular global longitudinal strain is 16.5 %. The global longitudinal strain is normal.  2. Right ventricular systolic function is normal. The right ventricular size is normal.  3. Left atrial size was mildly dilated.  4. The mitral valve is normal in structure. Mild mitral valve regurgitation.  5. The aortic valve is normal in structure. Aortic  valve regurgitation is not visualized. Aortic valve sclerosis/calcification is present, without any evidence of aortic stenosis. FINDINGS  Left Ventricle: Left ventricular ejection fraction, by estimation, is 60 to 65%. The left ventricle has normal function. The left ventricle has no regional wall motion abnormalities. The average left ventricular global longitudinal strain is 16.5 %. Strain was performed and the global longitudinal strain is normal. The left ventricular internal cavity size was mildly dilated. There is moderate concentric left ventricular hypertrophy. Left ventricular diastolic parameters are consistent with Grade I diastolic dysfunction (impaired relaxation). Right Ventricle: The right ventricular size is normal. No increase in right ventricular wall thickness. Right ventricular systolic function is normal. Left Atrium: Left atrial size was mildly dilated. Right Atrium: Right atrial size was normal in size. Pericardium: There is no evidence of pericardial effusion. Mitral Valve: The mitral valve is normal in structure. Mild mitral valve regurgitation. Tricuspid Valve: The tricuspid valve is normal in structure. Tricuspid valve regurgitation is mild. Aortic Valve: The aortic valve is normal in structure. Aortic valve regurgitation is not visualized. Aortic valve sclerosis/calcification is present, without any evidence of aortic stenosis. Aortic valve peak gradient measures 10.9 mmHg. Pulmonic Valve: The pulmonic valve was grossly normal. Pulmonic valve regurgitation is not visualized. Aorta: The ascending aorta was not well visualized. IAS/Shunts: No atrial level shunt detected by color flow Doppler. Agitated saline contrast was given intravenously to evaluate for intracardiac shunting. Additional Comments: 3D was performed not requiring image post processing on an independent workstation and was indeterminate.  LEFT VENTRICLE PLAX 2D LVIDd:         5.20 cm   Diastology LVIDs:         3.50 cm   LV  e' medial:    5.55 cm/s LV PW:         1.60 cm   LV E/e' medial:  14.8 LV IVS:        1.50 cm   LV e' lateral:   6.42 cm/s LVOT diam:     2.10 cm   LV E/e' lateral: 12.8 LV SV:         73 LV SV Index:   36        2D Longitudinal Strain LVOT Area:     3.46 cm  2D Strain GLS (A4C):   13.8 %  2D Strain GLS (A3C):   20.2 %                          2D Strain GLS (A2C):   15.4 %                          2D Strain GLS Avg:     16.5 % RIGHT VENTRICLE RV Basal diam:  3.10 cm RV S prime:     15.90 cm/s TAPSE (M-mode): 2.0 cm LEFT ATRIUM             Index        RIGHT ATRIUM           Index LA diam:        4.30 cm 2.11 cm/m   RA Area:     11.50 cm LA Vol (A2C):   24.4 ml 11.98 ml/m  RA Volume:   28.00 ml  13.75 ml/m LA Vol (A4C):   37.9 ml 18.61 ml/m LA Biplane Vol: 32.7 ml 16.06 ml/m  AORTIC VALVE                 PULMONIC VALVE AV Area (Vmax): 2.33 cm     PV Vmax:        1.22 m/s AV Vmax:        165.00 cm/s  PV Peak grad:   6.0 mmHg AV Peak Grad:   10.9 mmHg    RVOT Peak grad: 5 mmHg LVOT Vmax:      111.00 cm/s LVOT Vmean:     68.700 cm/s LVOT VTI:       0.210 m  AORTA Ao Root diam: 3.90 cm Ao Asc diam:  3.30 cm MITRAL VALVE MV Area (PHT): 3.89 cm     SHUNTS MV Decel Time: 195 msec     Systemic VTI:  0.21 m MV E velocity: 82.30 cm/s   Systemic Diam: 2.10 cm MV A velocity: 104.00 cm/s MV E/A ratio:  0.79 Dwayne D Callwood MD Electronically signed by Cara JONETTA Lovelace MD Signature Date/Time: 10/21/2023/2:17:33 PM    Final    MR ANGIO HEAD WO CONTRAST Result Date: 10/20/2023 CLINICAL DATA:  Stroke/TIA EXAM: MRA NECK WITHOUT AND WITH CONTRAST MRA HEAD WITHOUT CONTRAST TECHNIQUE: Multiplanar and multiecho pulse sequences of the neck were obtained without and with intravenous contrast. Angiographic images of the neck were obtained using MRA technique without and with intravenous contrast.; Angiographic images of the Circle of Willis were obtained using MRA technique without intravenous contrast.  CONTRAST:  10mL GADAVIST  GADOBUTROL  1 MMOL/ML IV SOLN COMPARISON:  None Available. FINDINGS: MRA NECK FINDINGS Aortic arch: There is a normal variant aortic arch branching pattern with the brachiocephalic and left common carotid arteries sharing a common origin. Right carotid system: The right ICA is severely narrowed at its origin (with focal poststenotic dilatation of the proximal ICA. The distal ICA is somewhat irregular with undulation and multifocal stenosis. The right ICA terminus appears occluded past the ophthalmic segment. Left carotid system: Unremarkable Vertebral arteries: Patent with antegrade flow. Other: None. MRA HEAD FINDINGS POSTERIOR CIRCULATION: The right vertebral artery is occluded just proximal to the vertebrobasilar confluence. There is severe, near complete stenosis of the distal left vertebral artery. Basilar artery is patent and normal caliber. Superior cerebellar arteries are normal. Severe stenosis of the right PCA P2 segment. Narrowing of the distal P3 segment of the left PCA. ANTERIOR CIRCULATION: Poor flow  related enhancement of the right internal carotid artery. The petrous segment is markedly narrowed and the lacerum and proximal cavernous segments are likely occluded. The terminus also appears occluded. Unremarkable left ICA. Anterior cerebral arteries are normal. 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 Normal left MCA. Anatomic Variants: None IMPRESSION: 1. 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 2. The right ICA terminus appears occluded past the ophthalmic segment. 3. 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 4. Severe stenosis of the right PCA P2 segment. 5. 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 6. Severe, near complete stenosis of the distal left vertebral artery. Electronically Signed   By: Franky Stanford M.D.    On: 10/20/2023 23:47   MR ANGIO NECK W WO CONTRAST Result Date: 10/20/2023 CLINICAL DATA:  Stroke/TIA EXAM: MRA NECK WITHOUT AND WITH CONTRAST MRA HEAD WITHOUT CONTRAST TECHNIQUE: Multiplanar and multiecho pulse sequences of the neck were obtained without and with intravenous contrast. Angiographic images of the neck were obtained using MRA technique without and with intravenous contrast.; Angiographic images of the Circle of Willis were obtained using MRA technique without intravenous contrast. CONTRAST:  10mL GADAVIST  GADOBUTROL  1 MMOL/ML IV SOLN COMPARISON:  None Available. FINDINGS: MRA NECK FINDINGS Aortic arch: There is a normal variant aortic arch branching pattern with the brachiocephalic and left common carotid arteries sharing a common origin. Right carotid system: The right ICA is severely narrowed at its origin (with focal poststenotic dilatation of the proximal ICA. The distal ICA is somewhat irregular with undulation and multifocal stenosis. The right ICA terminus appears occluded past the ophthalmic segment. Left carotid system: Unremarkable Vertebral arteries: Patent with antegrade flow. Other: None. MRA HEAD FINDINGS POSTERIOR CIRCULATION: The right vertebral artery is occluded just proximal to the vertebrobasilar confluence. There is severe, near complete stenosis of the distal left vertebral artery. Basilar artery is patent and normal caliber. Superior cerebellar arteries are normal. Severe stenosis of the right PCA P2 segment. Narrowing of the distal P3 segment of the left PCA. ANTERIOR CIRCULATION: Poor flow related enhancement of the right internal carotid artery. The petrous segment is markedly narrowed and the lacerum and proximal cavernous segments are likely occluded. The terminus also appears occluded. Unremarkable left ICA. Anterior cerebral arteries are normal. 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 Normal left MCA. Anatomic Variants: None  IMPRESSION: 1. 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 2. The right ICA terminus appears occluded past the ophthalmic segment. 3. 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 4. Severe stenosis of the right PCA P2 segment. 5. 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 6. Severe, near complete stenosis of the distal left vertebral artery. Electronically Signed   By: Franky Stanford M.D.   On: 10/20/2023 23:47     Assessment & Plan: Mr. Zacarias Krauter is a 63 y.o.  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  Baseline appears to be creatinine 1.88 with GFR 39 on 08/14/2023.  Patient does not currently follow with nephrology.  Renal ultrasound shows mild  right kidney atrophy, no obstruction.  No recent IV contrast exposure.  Creatinine 2.22 on ED arrival, 2.32 today.  No acute indication for dialysis.  Will order serologic workup including SPEP, UPEP, protein creatinine ratio. Will also order a UA.   2. Anemia of chronic kidney disease Lab Results  Component Value Date   HGB 12.6 (L) 10/22/2023    Hgb within desired range.   3. Secondary Hyperparathyroidism: with outpatient labs:  Lab Results  Component Value Date   CALCIUM  8.9 10/22/2023    Will order PTH in am. Will continue to monitor bone minerals.   4. Diabetes mellitus type II with chronic kidney disease/renal manifestations: noninsulin dependent. Home regimen includes glipizide . Most recent hemoglobin A1c is 6.1 on 10/20/23.    LOS: 2 Tyshell Ramberg 7/21/20254:07 PM

## 2023-10-22 NOTE — Procedures (Signed)
 PROCEDURE SUMMARY:  Successful fluoroscopic guided lumbar puncture at the level of L3-4.  Opening pressure was 12 cm H2O ~7 mL clear colorless fluid collected and sent for labs.  No immediate complications.  Pt tolerated well.   EBL = none  Please see full dictation in imaging section of Epic for procedure details.   Electronically Signed: Avraham Benish M Kamare Caspers, PA-C 10/22/2023, 1:48 PM

## 2023-10-22 NOTE — Progress Notes (Signed)
 NEUROLOGY CONSULT FOLLOW UP NOTE   Date of service: October 22, 2023 Patient Name: Richard Hunt MRN:  [id number] DOB:  1960-07-19  Interval Hx/subjective   -No new symptoms -Headache continues   Vitals   Vitals:   10/21/23 1606 10/21/23 2039 10/21/23 2321 10/22/23 0341  BP: (!) 173/79 (!) 169/89 (!) 164/95 (!) 180/92  Pulse: 82 79 77 78  Resp:  20 17 20   Temp:  98.6 F (37 C) 97.7 F (36.5 C) 98 F (36.7 C)  TempSrc:  Oral Oral   SpO2: 100% 99% 99% 98%  Weight:      Height:         Body mass index is 35.62 kg/m.  Current vital signs: BP (!) 149/88 (BP Location: Left Arm)   Pulse 79   Temp 98 F (36.7 C)   Resp 20   Ht 5' 5 (1.651 m)   Wt 97.1 kg   SpO2 100%   BMI 35.62 kg/m  Vital signs in last 24 hours: Temp:  [97.7 F (36.5 C)-98.6 F (37 C)] 98 F (36.7 C) (07/21 0824) Pulse Rate:  [74-82] 79 (07/21 0824) Resp:  [17-20] 20 (07/21 0341) BP: (149-180)/(79-95) 149/88 (07/21 0824) SpO2:  [98 %-100 %] 100 % (07/21 0824)    Physical Exam   Mental Status: Patient is awake, alert, appropriately conversant Cranial Nerves: II: Visual Fields are full III,IV, VI: EOMI without ptosis or diploplia. Saccadic pursuits  V: Facial sensation is diminished on the right on yesterday's testing by Dr. Michaela, reports symmetric to me today VII: Facial movement is symmetric.  VIII: hearing is intact to voice Motor: Tone is normal. Bulk is normal. 5/5 strength was present on the right, he has subtle left leg > left arm weakness in an upper motor neuron pattern Sensory: Sensation is symmetric to light touch and temperature in the arms and legs. Reports only transient left sided sensory symptoms several months ago lasting about 20 min Cerebellar: FNF intact bilaterally for Dr. Michaela 7/20, not tested today  Medications  Current Facility-Administered Medications:    acetaminophen  (TYLENOL ) tablet 650 mg, 650 mg, Oral, Q4H PRN, 650 mg at 10/21/23 0949  **OR** acetaminophen  (TYLENOL ) 160 MG/5ML solution 650 mg, 650 mg, Per Tube, Q4H PRN **OR** acetaminophen  (TYLENOL ) suppository 650 mg, 650 mg, Rectal, Q4H PRN, Swayze, Ava, DO   amLODipine  (NORVASC ) tablet 5 mg, 5 mg, Oral, Daily, Ayiku, Bernard, MD, 5 mg at 10/21/23 1610   aspirin  chewable tablet 81 mg, 81 mg, Oral, Daily, Swayze, Ava, DO, 81 mg at 10/21/23 0944   atorvastatin  (LIPITOR ) tablet 80 mg, 80 mg, Oral, Daily, Michaela Aisha SQUIBB, MD   clopidogrel  (PLAVIX ) tablet 75 mg, 75 mg, Oral, Daily, Swayze, Ava, DO, 75 mg at 10/21/23 0944   gabapentin  (NEURONTIN ) capsule 100 mg, 100 mg, Oral, QHS, Jens Durand, MD, 100 mg at 10/21/23 2141   heparin  injection 5,000 Units, 5,000 Units, Subcutaneous, Q8H, Swayze, Ava, DO, 5,000 Units at 10/22/23 0606   hydrALAZINE  (APRESOLINE ) tablet 75 mg, 75 mg, Oral, TID, Jens Durand, MD, 75 mg at 10/21/23 2141   hydrochlorothiazide  (HYDRODIURIL ) tablet 25 mg, 25 mg, Oral, Daily, Ayiku, Bernard, MD, 25 mg at 10/21/23 1610   insulin  aspart (novoLOG ) injection 0-15 Units, 0-15 Units, Subcutaneous, TID WC, Swayze, Ava, DO, 5 Units at 10/21/23 1352   LORazepam  (ATIVAN ) tablet 0.5 mg, 0.5 mg, Oral, Once PRN, Swayze, Ava, DO   Oral care mouth rinse, 15 mL, Mouth Rinse, PRN, Swayze, Ava, DO  pantoprazole  (PROTONIX ) EC tablet 40 mg, 40 mg, Oral, Daily, Swayze, Ava, DO, 40 mg at 10/21/23 0944   senna-docusate (Senokot-S) tablet 1 tablet, 1 tablet, Oral, QHS PRN, Swayze, Ava, DO   traMADol  (ULTRAM ) tablet 50 mg, 50 mg, Oral, Q8H PRN, Jens Durand, MD  Labs and Diagnostic Imaging   Additional workup: ESR 63 (0-20 ref range) CRP 0.8 (WNL) HIV neg  Pending ANCA, C3, C4, SSA, SSB, cryoglobulin, ana w/ reflex to broader lupus panel, igg/iga/igm  CBC:  Recent Labs  Lab 10/20/23 1300 10/22/23 0520  WBC 10.0 8.2  NEUTROABS 7.0  --   HGB 12.4* 12.6*  HCT 34.7* 34.5*  MCV 87.0 86.0  PLT 235 227    Basic Metabolic Panel:  Lab Results  Component Value  Date   NA 135 10/22/2023   K 3.9 10/22/2023   CO2 24 10/22/2023   GLUCOSE 130 (H) 10/22/2023   BUN 28 (H) 10/22/2023   CREATININE 2.32 (H) 10/22/2023   CALCIUM  8.9 10/22/2023   GFRNONAA 30 (L) 10/22/2023   GFRAA >60 12/12/2016   Lipid Panel:  Lab Results  Component Value Date   LDLCALC 74 10/21/2023   HgbA1c:  Lab Results  Component Value Date   HGBA1C 6.1 (H) 10/20/2023     Urine Drug Screen:     Component Value Date/Time   LABOPIA NONE DETECTED 10/20/2023 1618   COCAINSCRNUR NONE DETECTED 10/20/2023 1618   LABBENZ NONE DETECTED 10/20/2023 1618   AMPHETMU NONE DETECTED 10/20/2023 1618   THCU NONE DETECTED 10/20/2023 1618   LABBARB NONE DETECTED 10/20/2023 1618    Alcohol Level     Component Value Date/Time   ETH <5 12/10/2016 0122   INR  Lab Results  Component Value Date   INR 1.0 10/20/2023   APTT  Lab Results  Component Value Date   APTT 29 10/20/2023    CT Head without contrast(Personally reviewed): 1. Small age-indeterminate infarct within the right cerebellar hemisphere. Consider a brain MRI for further evaluation. 2. Moderate chronic small vessel ischemic changes within the cerebral white matter. 3. Mild paranasal sinus mucosal thickening. 4. Left mastoid effusion (with bilateral mastoid air cell sclerosis).  CT angio Chest / Aorta (Personally reviewed):  1. No acute intrathoracic pathology. No CT evidence of aortic aneurysm or dissection. [ moderate calcified and noncalcified plaque of the aortic arch] 2. Three-vessel coronary vascular calcification. 3.  Aortic Atherosclerosis (ICD10-I70.0).   MR Angio head without contrast and Neck w/ and w/o contrast (Personally reviewed): 1. 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 2. The right ICA terminus appears occluded past the ophthalmic segment. 3. 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 4. Severe stenosis of the right  PCA P2 segment. 5. 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 6. Severe, near complete stenosis of the distal left vertebral artery.  MRI Brain(Personally reviewed): 1. Patchy small acute/subacute infarcts scattered within the callosal splenium on the left, and within the bilateral parietooccipital lobes. 2. Background age-advanced chronic small vessel ischemic disease with multiple chronic lacunar infarcts, as described. 3. Few nonspecific chronic microhemorrhages within the supratentorial brain. 4. Asymmetrically diminutive right internal carotid artery at the imaged levels suspicious for the presence of a non-visualized upstream (more proximal) stenosis within the neck. Consider CT or MR angiography further evaluation. 5. Minor paranasal sinus mucosal thickening. 6. Trace fluid within left mastoid air cells.    Assessment   Richard  Mickie Hunt is a 63 y.o. man with several weeks of blurred vision, headaches.  He has multiple reasons for atherosclerotic disease, which may explain his symptoms (headaches and strokes with mild left sided weakness / sensory changes), however given his progressive headaches need to rule out underlying process. Will assess for inflammatory etiology with LP and checking autoimmune labs to assess for possible vasculitis.   On my personal review of his recent aortic imaging back in May, this seems more consistent with atherosclerotic disease than the circumferential findings typically seen with vasculitis, however thus far his ESR is fairly elevated.  This is a nonspecific marker but certainly does indicate we need to continue following the inflammatory workup above  Recommendations  -Follow-up pending labs listed above under labs -Additional labs: Rheumatoid factor, hepatitis testing, RPR -LP to look for inflammatory response, will need to be under IR as bedside LP is contraindicated on Plavix  due to high  risk of a blind bedside procedure compared to fluoroscopy guided procedure; patient wants physician to return to discuss necessity of this procedure with his family -CSF labs ordered: Cell counts and tubes 1 and 4, protein and glucose, IgG index, oligoclonal bands, meningitis/encephalitis PCR panel -Compazine  10mg  IV for headache received 7/20 evening -Continue lipitor  80mg  at bedtime (increased from home 40 mg at bedtime) -Continue DAPT with ASA and plavix , 90-day course, followed by Plavix  monotherapy going forward, with patient counseled to switch to -aspirin  monotherapy if insurance/medication accessibility issues arise -Permissive hypertension, okay to start gradually normalizing blood pressure today, but if he becomes symptomatic with lower blood pressure may need a higher blood pressure goal -Consider verapamil  for dual headache and BP control, discontinue amlodipine  -Will follow ______________________________________________________________________   Lola Jernigan MD-PhD [facility] [phone]  [facility] coverage for [facility] is from 8 AM to 4 AM in-house and 4 PM to 8 PM by telephone/video. 8 PM to 8 AM emergent questions or overnight urgent questions should be addressed to Teleneurology On-call or [facility] neurohospitalist; contact information can be found on AMION  I personally spent a total of 55  minutes in the care of the patient today including preparing to see the patient, getting/reviewing separately obtained history, performing a medically appropriate exam/evaluation, counseling and educating, placing orders, referring and communicating with other health care professionals, documenting clinical information in the EHR, independently interpreting results, communicating results, and coordinating care. In person interpreter was used for first visit with patient

## 2023-10-22 NOTE — Care Management Important Message (Signed)
 Important Message  Patient Details  Name: Richard Hunt MRN: [id number] Date of Birth: March 21, 1961   Important Message Given:  Yes - Medicare IM     [name] 10/22/2023, 12:54 PM

## 2023-10-23 DIAGNOSIS — G450 Vertebro-basilar artery syndrome: Secondary | ICD-10-CM

## 2023-10-23 DIAGNOSIS — R700 Elevated erythrocyte sedimentation rate: Secondary | ICD-10-CM | POA: Diagnosis not present

## 2023-10-23 DIAGNOSIS — I639 Cerebral infarction, unspecified: Secondary | ICD-10-CM | POA: Diagnosis not present

## 2023-10-23 DIAGNOSIS — I7090 Unspecified atherosclerosis: Secondary | ICD-10-CM | POA: Diagnosis not present

## 2023-10-23 LAB — ANA W/REFLEX: Anti Nuclear Antibody (ANA): NEGATIVE

## 2023-10-23 LAB — PROTEIN ELECTROPHORESIS, SERUM
A/G Ratio: 1 (ref 0.7–1.7)
Albumin ELP: 3.9 g/dL (ref 2.9–4.4)
Alpha-1-Globulin: 0.3 g/dL (ref 0.0–0.4)
Alpha-2-Globulin: 0.9 g/dL (ref 0.4–1.0)
Beta Globulin: 1.3 g/dL (ref 0.7–1.3)
Gamma Globulin: 1.4 g/dL (ref 0.4–1.8)
Globulin, Total: 3.9 g/dL (ref 2.2–3.9)
Total Protein ELP: 7.8 g/dL (ref 6.0–8.5)

## 2023-10-23 LAB — GLUCOSE, CAPILLARY
Glucose-Capillary: 113 mg/dL — ABNORMAL HIGH (ref 70–99)
Glucose-Capillary: 149 mg/dL — ABNORMAL HIGH (ref 70–99)
Glucose-Capillary: 146 mg/dL — ABNORMAL HIGH (ref 70–99)

## 2023-10-23 LAB — IGG, IGA, IGM
IgA: 422 mg/dL (ref 61–437)
IgG (Immunoglobin G), Serum: 1093 mg/dL (ref 603–1613)
IgM (Immunoglobulin M), Srm: 132 mg/dL (ref 20–172)

## 2023-10-23 LAB — SJOGRENS SYNDROME-B EXTRACTABLE NUCLEAR ANTIBODY: SSB (La) (ENA) Antibody, IgG: <0.2 AI (ref 0.0–0.9)

## 2023-10-23 LAB — C3 COMPLEMENT: C3 Complement: 131 mg/dL (ref 82–167)

## 2023-10-23 LAB — C4 COMPLEMENT: Complement C4, Body Fluid: 32 mg/dL (ref 12–38)

## 2023-10-23 LAB — SJOGRENS SYNDROME-A EXTRACTABLE NUCLEAR ANTIBODY: SSA (Ro) (ENA) Antibody, IgG: 0.2 AI (ref 0.0–0.9)

## 2023-10-23 LAB — RHEUMATOID FACTOR: Rheumatoid fact SerPl-aCnc: <10 [IU]/mL (ref <14.0)

## 2023-10-23 LAB — RPR: RPR Ser Ql: NONREACTIVE

## 2023-10-23 MED ORDER — HYDRALAZINE HCL 50 MG PO TABS
75.0000 mg | ORAL_TABLET | Freq: Three times a day (TID) | ORAL | Status: DC
  Administered 2023-10-23 – 2023-10-24 (×3): 75 mg via ORAL
  Filled 2023-10-23 (×3): qty 2

## 2023-10-23 MED ORDER — HYDRALAZINE HCL 50 MG PO TABS
100.0000 mg | ORAL_TABLET | Freq: Three times a day (TID) | ORAL | Status: DC
  Administered 2023-10-23: 100 mg via ORAL
  Filled 2023-10-23: qty 2

## 2023-10-23 NOTE — Progress Notes (Addendum)
 Progress Note    Richard Hunt  FMW:[id number] DOB: 1960/04/11  DOA: 10/20/2023 PCP: [facility]      Brief Narrative:    Medical records reviewed and are as summarized below:  Richard Hunt is a 63 y.o. male with medical history significant hypertension, hyperlipidemia, diabetes mellitus, CKD stage IIIb with proteinuria, chronic venous insufficiency, who presented to the hospital because of headache and intermittent dizziness for over 2 months.  He is a known hypertensive urgency and has been medically adherent with medications.  Blood pressure has still been uncontrolled.  Family reported that sometimes his speech has been difficult to understand.   In the ED, BP was 197/140.  MRI brain reviewed by the small acute/subacute infarct scattered within the callosal splenium on the left and within the bilateral parieto-occipital lobes.     Assessment/Plan:   Principal Problem:   CVA (cerebral vascular accident) (HCC) Active Problems:   Diabetes (HCC)   HTN (hypertension)   HLD (hyperlipidemia)   GERD (gastroesophageal reflux disease)   Body mass index is 35.62 kg/m.  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  Neurologist recommended dual antiplatelet therapy with aspirin  and Plavix  for 3 months followed by Plavix  monotherapy 2D echo showed EF estimated at 60 to 65%, moderate concentric LVH, grade 1 diastolic dysfunction and no evidence of interatrial shunt. Lower extremity pain: Pain is better.  Venous duplex did not show any evidence of DVT. MRA head and neck showed severe narrowing at different levels of of right ICA, right MCA, right PCA and occlusion of right vertebral artery just proximal to the vertebrobasilar confluence, and severe near complete stenosis of the distal left vertebral artery.SABRA  He was evaluated by ST, PT and OT.  No skilled therapy required. Follow-up with your  neurologist for further recommendations.   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   Lumbar puncture did not show any evidence of meningitis or encephalitis.   Continue verapamil , dose increased in favor of upward titration of hydralazine  per neurologist Case discussed with Dr. Jerrie, neurologist.     Hypertensive urgency: BP stable.  Continue verapamil , hydralazine  and hydrochlorothiazide .   Type II DM with hyperglycemia: NovoLog  as needed for hyperglycemia.  Hold glimepiride. Hemoglobin A1c 6.1.   Probable AKI on CKD stage IIIb, proteinuria: Difficult to determine exact status of renal insufficiency given fluctuating creatinine/GFR.   Normal C3-C4 levels no M spike on protein electrophoresis. ANA, Sjogren syndrome AMB antibodies, IgG, IgA and IgM antibodies were all normal. ANCA titers, cryoglobulin, oligoclonal CSF bands are pending. Follow-up with nephrologist for further recommendations.   Chart review showed that baseline creatinine between 1.8-2.4. Urine albumin creatinine ratio was 1,974.1 on 08/17/2023.   Hyperlipidemia: Controlled.  LDL 74, HDL 41, triglycerides 113 and total cholesterol 138.  Continue Lipitor        Diet Order             Diet heart healthy/carb modified Room service appropriate? Yes; Fluid consistency: Thin  Diet effective now                            Consultants: Neurologist Nephrologist  Procedures: Lumbar puncture by IR on 10/22/2023    Medications:    aspirin   81 mg Oral Daily   atorvastatin   80 mg Oral QHS   clopidogrel   75 mg Oral Daily   gabapentin   100 mg Oral QHS   heparin   5,000 Units Subcutaneous Q8H   hydrALAZINE   75 mg Oral TID   hydrochlorothiazide   25 mg Oral Daily   insulin  aspart  0-15 Units Subcutaneous TID WC   pantoprazole   40 mg Oral Daily   verapamil   40 mg Oral Q8H   Continuous Infusions:     Anti-infectives (From admission, onward)     None              Family Communication/Anticipated D/C date and plan/Code Status   DVT prophylaxis: heparin  injection 5,000 Units Start: 10/20/23 2200     Code Status: Full Code  Family Communication: None Disposition Plan: Plan to discharge home   Status is: Inpatient Remains inpatient appropriate because: Acute stroke       Subjective:   No acute events overnight.  He said he was able to take a shower without any problems.  Headache is better.  Objective:    Vitals:   10/23/23 0019 10/23/23 0507 10/23/23 0741 10/23/23 1149  BP: (!) 152/104 (!) 172/92 (!) 152/81 (!) 161/90  Pulse: 74 77 67 73  Resp:  18 20 20   Temp: 98.5 F (36.9 C) 97.8 F (36.6 C) 97.6 F (36.4 C) 97.8 F (36.6 C)  TempSrc:      SpO2: 99% 98% 98% 99%  Weight:      Height:       No data found.   Intake/Output Summary (Last 24 hours) at 10/23/2023 1657 Last data filed at 10/23/2023 1436 Gross per 24 hour  Intake 240 ml  Output --  Net 240 ml   Filed Weights   10/20/23 1146  Weight: 97.1 kg    Exam:  GEN: NAD SKIN: No rash EYES: EOMI ENT: MMM CV: RRR PULM: CTA B ABD: soft, ND, NT, +BS CNS: AAO x 3, non focal EXT: No edema or tenderness        Data Reviewed:   I have personally reviewed following labs and imaging studies:  Labs: Labs show the following:   Basic Metabolic Panel: Recent Labs  Lab 10/20/23 1300 10/22/23 0520  NA 140 135  K 3.9 3.9  CL 108 101  CO2 23 24  GLUCOSE 129* 130*  BUN 34* 28*  CREATININE 2.22* 2.32*  CALCIUM  8.7* 8.9   GFR Estimated Creatinine Clearance: 34 mL/min (A) (by C-G formula based on SCr of 2.32 mg/dL (H)). Liver Function Tests: No results for input(s): AST, ALT, ALKPHOS, BILITOT, PROT, ALBUMIN in the last 168 hours. No results for input(s): LIPASE, AMYLASE in the last 168 hours. No results for input(s): AMMONIA in the last 168 hours. Coagulation profile Recent Labs  Lab 10/20/23 1548   INR 1.0    CBC: Recent Labs  Lab 10/20/23 1300 10/22/23 0520  WBC 10.0 8.2  NEUTROABS 7.0  --   HGB 12.4* 12.6*  HCT 34.7* 34.5*  MCV 87.0 86.0  PLT 235 227   Cardiac Enzymes: No results for input(s): CKTOTAL, CKMB, CKMBINDEX, TROPONINI in the last 168 hours. BNP (last 3 results) No results for input(s): PROBNP in the last 8760 hours. CBG: Recent Labs  Lab 10/22/23 1224 10/22/23 1658 10/22/23 2109 10/23/23 0735 10/23/23 1150  GLUCAP 176* 114* 165* 113* 149*   D-Dimer: No results for input(s): DDIMER in the last 72 hours. Hgb A1c: No results for input(s): HGBA1C in the last 72 hours.  Lipid Profile: Recent Labs    10/21/23 0455  CHOL 138  HDL 41  LDLCALC 74  TRIG 113  CHOLHDL 3.4   Thyroid function studies: No results for input(s): TSH, T4TOTAL, T3FREE, THYROIDAB in the last 72 hours.  Invalid input(s): FREET3 Anemia work up: No results for input(s): VITAMINB12, FOLATE, FERRITIN, TIBC, IRON, RETICCTPCT in the last 72 hours. Sepsis Labs: Recent Labs  Lab 10/20/23 1300 10/22/23 0520  WBC 10.0 8.2    Microbiology Recent Results (from the past 240 hours)  CSF culture w Gram Stain     Status: None (Preliminary result)   Collection Time: 10/22/23  1:36 PM   Specimen: PATH Cytology CSF; Cerebrospinal Fluid  Result Value Ref Range Status   Specimen Description   Final    CSF Performed at [facility], [address], [place], [place] [address]    Special Requests   Final    NONE Performed at [facility], [address] [place], [place] [address]    Gram Stain   Final    RED BLOOD CELLS PRESENT WBC SEEN NO ORGANISMS SEEN CYTOSPIN SMEAR    Culture   Final    NO GROWTH < 24 HOURS Performed at [facility], [address] [place], [place] [address]    Report Status PENDING  Incomplete    Procedures and diagnostic studies:  DG FL GUIDED LUMBAR PUNCTURE Result Date:  10/22/2023 CLINICAL DATA:  63 year old male who presented to the ED with headaches, blurry vision, and unsteadiness on his feet. Reports increasing frequency and severity of headaches over the past few months. Imaging significant for small-age indeterminate infarct in the right cerebellar hemisphere and few nonspecific chronic microhemorrhages. Request for lumbar puncture to rule out additional underlying processes. EXAM: LUMBAR PUNCTURE UNDER FLUOROSCOPY PROCEDURE: An appropriate skin entry site was determined fluoroscopically. Operator donned sterile gloves and mask. Skin site was marked, then prepped with Betadine, draped in usual sterile fashion, and infiltrated locally with 1% lidocaine . A 5-inch 22 gauge spinal needle advanced into the thecal sac at L3-4 from a left interlaminar approach. Clear colorless CSF spontaneously returned, with opening pressure of 12 cm water. 7 ml CSF were collected and divided among 4 sterile vials for the requested laboratory studies. The needle was then removed. The patient tolerated the procedure well and there were no complications. FLUOROSCOPY: Radiation Exposure Index (as provided by the fluoroscopic device): 44.5 mGy Kerma IMPRESSION: Technically successful lumbar puncture under fluoroscopy. This exam was performed by [facility] PA-C, and was supervised and interpreted by Dr. ONEIDA Specking. Electronically Signed   By: CHRISTELLA.  Shick M.D.   On: 10/22/2023 13:55   US  RENAL Result Date: 10/22/2023 CLINICAL DATA:  Acute renal failure EXAM: RENAL / URINARY TRACT ULTRASOUND COMPLETE COMPARISON:  12/10/2016 CT FINDINGS: Right Kidney: Renal measurements: 11.1 x 5.5 x 5.3 cm = volume: 168 mL. Echogenicity within normal limits. No mass or hydronephrosis visualized. Left Kidney: Renal measurements: 9.5 x 4.2 x 3.8 cm = volume: 80 mL. Trace left perinephric edema/fluid, nonspecific. Normal echogenicity. No hydronephrosis. Renal cysts of up to 1.7 cm. Bladder: Decompressed Other: None.  IMPRESSION: Mild left renal atrophy. No hydronephrosis or other explanation for renal failure. Electronically Signed   By: Rockey Kilts M.D.   On: 10/22/2023 10:54               LOS: 3 days   Richard Hunt  [facility]   Pager on [email or web address]. If 7PM-7AM, please contact night-coverage at [email or web address]     10/23/2023, 4:57 PM

## 2023-10-23 NOTE — Progress Notes (Signed)
 [facility]  ROUNDING NOTE   Subjective:   Patient seen sitting up in chair Alert and oriented, states he feels well Room air No lower extremity edema Appetite appropriate  No labs available today  Objective:  Vital signs in last 24 hours:  Temp:  [97.6 F (36.4 C)-98.5 F (36.9 C)] 97.8 F (36.6 C) (07/22 1149) Pulse Rate:  [67-90] 73 (07/22 1149) Resp:  [18-20] 20 (07/22 1149) BP: (152-174)/(74-104) 161/90 (07/22 1149) SpO2:  [98 %-100 %] 99 % (07/22 1149)  Weight change:  Filed Weights   10/20/23 1146  Weight: 97.1 kg    Intake/Output: No intake/output data recorded.   Intake/Output this shift:  Total I/O In: 240 [P.O.:240] Out: -   Physical Exam: General: NAD  Head: Normocephalic, atraumatic. Moist oral mucosal membranes  Eyes: Anicteric  Neck: Supple  Lungs:  Clear to auscultation, normal effort, room air  Heart: Regular rate and rhythm  Abdomen:  Soft, nontender  Extremities: No peripheral edema.  Neurologic: Awake, alert, conversant  Skin: Warm,dry, no rash       Basic Metabolic Panel: Recent Labs  Lab 10/20/23 1300 10/22/23 0520  NA 140 135  K 3.9 3.9  CL 108 101  CO2 23 24  GLUCOSE 129* 130*  BUN 34* 28*  CREATININE 2.22* 2.32*  CALCIUM  8.7* 8.9    Liver Function Tests: No results for input(s): AST, ALT, ALKPHOS, BILITOT, PROT, ALBUMIN in the last 168 hours. No results for input(s): LIPASE, AMYLASE in the last 168 hours. No results for input(s): AMMONIA in the last 168 hours.  CBC: Recent Labs  Lab 10/20/23 1300 10/22/23 0520  WBC 10.0 8.2  NEUTROABS 7.0  --   HGB 12.4* 12.6*  HCT 34.7* 34.5*  MCV 87.0 86.0  PLT 235 227    Cardiac Enzymes: No results for input(s): CKTOTAL, CKMB, CKMBINDEX, TROPONINI in the last 168 hours.  BNP: Invalid input(s): POCBNP  CBG: Recent Labs  Lab 10/22/23 1224 10/22/23 1658 10/22/23 2109 10/23/23 0735 10/23/23 1150  GLUCAP 176* 114* 165*  113* 149*    Microbiology: Results for orders placed or performed during the hospital encounter of 10/20/23  CSF culture w Gram Stain     Status: None (Preliminary result)   Collection Time: 10/22/23  1:36 PM   Specimen: PATH Cytology CSF; Cerebrospinal Fluid  Result Value Ref Range Status   Specimen Description   Final    CSF Performed at [facility], [address] [place], [place] [address]    Special Requests   Final    NONE Performed at [facility], [address] [place], [place] [address]    Gram Stain   Final    RED BLOOD CELLS PRESENT WBC SEEN NO ORGANISMS SEEN CYTOSPIN SMEAR    Culture   Final    NO GROWTH < 24 HOURS Performed at [facility], [address] [place], [place] [address]    Report Status PENDING  Incomplete    Coagulation Studies: Recent Labs    10/20/23 1548  LABPROT 13.3  INR 1.0    Urinalysis: Recent Labs    10/22/23 2040  COLORURINE YELLOW*  LABSPEC 1.012  PHURINE 6.0  GLUCOSEU 50*  HGBUR NEGATIVE  BILIRUBINUR NEGATIVE  KETONESUR NEGATIVE  PROTEINUR >=300*  NITRITE NEGATIVE  LEUKOCYTESUR NEGATIVE      Imaging: DG FL GUIDED LUMBAR PUNCTURE Result Date: 10/22/2023 CLINICAL DATA:  63 year old male who presented to the ED with headaches, blurry vision, and unsteadiness  on his feet. Reports increasing frequency and severity of headaches over the past few months. Imaging significant for small-age indeterminate infarct in the right cerebellar hemisphere and few nonspecific chronic microhemorrhages. Request for lumbar puncture to rule out additional underlying processes. EXAM: LUMBAR PUNCTURE UNDER FLUOROSCOPY PROCEDURE: An appropriate skin entry site was determined fluoroscopically. Operator donned sterile gloves and mask. Skin site was marked, then prepped with Betadine, draped in usual sterile fashion, and infiltrated locally with 1% lidocaine . A 5-inch 22 gauge spinal needle advanced into the thecal sac at  L3-4 from a left interlaminar approach. Clear colorless CSF spontaneously returned, with opening pressure of 12 cm water. 7 ml CSF were collected and divided among 4 sterile vials for the requested laboratory studies. The needle was then removed. The patient tolerated the procedure well and there were no complications. FLUOROSCOPY: Radiation Exposure Index (as provided by the fluoroscopic device): 44.5 mGy Kerma IMPRESSION: Technically successful lumbar puncture under fluoroscopy. This exam was performed by [facility] PA-C, and was supervised and interpreted by Dr. ONEIDA Specking. Electronically Signed   By: CHRISTELLA.  Shick M.D.   On: 10/22/2023 13:55   US  RENAL Result Date: 10/22/2023 CLINICAL DATA:  Acute renal failure EXAM: RENAL / URINARY TRACT ULTRASOUND COMPLETE COMPARISON:  12/10/2016 CT FINDINGS: Right Kidney: Renal measurements: 11.1 x 5.5 x 5.3 cm = volume: 168 mL. Echogenicity within normal limits. No mass or hydronephrosis visualized. Left Kidney: Renal measurements: 9.5 x 4.2 x 3.8 cm = volume: 80 mL. Trace left perinephric edema/fluid, nonspecific. Normal echogenicity. No hydronephrosis. Renal cysts of up to 1.7 cm. Bladder: Decompressed Other: None. IMPRESSION: Mild left renal atrophy. No hydronephrosis or other explanation for renal failure. Electronically Signed   By: Rockey Kilts M.D.   On: 10/22/2023 10:54   US  Venous Img Lower Bilateral (DVT) Result Date: 10/21/2023 CLINICAL DATA:  Leg pain.  Stroke EXAM: BILATERAL LOWER EXTREMITY VENOUS DOPPLER ULTRASOUND TECHNIQUE: Gray-scale sonography with graded compression, as well as color Doppler and duplex ultrasound were performed to evaluate the lower extremity deep venous systems from the level of the common femoral vein and including the common femoral, femoral, profunda femoral, popliteal and calf veins including the posterior tibial, peroneal and gastrocnemius veins when visible. The superficial great saphenous vein was also interrogated. Spectral  Doppler was utilized to evaluate flow at rest and with distal augmentation maneuvers in the common femoral, femoral and popliteal veins. COMPARISON:  Left lower extremity DVT ultrasound 12/20/2016 FINDINGS: RIGHT LOWER EXTREMITY Common Femoral Vein: No evidence of thrombus. Normal compressibility, respiratory phasicity and response to augmentation. Saphenofemoral Junction: No evidence of thrombus. Normal compressibility and flow on color Doppler imaging. Profunda Femoral Vein: No evidence of thrombus. Normal compressibility and flow on color Doppler imaging. Femoral Vein: No evidence of thrombus. Normal compressibility, respiratory phasicity and response to augmentation. Popliteal Vein: No evidence of thrombus. Normal compressibility, respiratory phasicity and response to augmentation. Calf Veins: No evidence of thrombus. Normal compressibility and flow on color Doppler imaging. Superficial Great Saphenous Vein: No evidence of thrombus. Normal compressibility. Venous Reflux:  None. Other Findings: Laterally there is a oblong fluid collection measuring 3.0 x 0.6 cm at the level of the knee. LEFT LOWER EXTREMITY Common Femoral Vein: No evidence of thrombus. Normal compressibility, respiratory phasicity and response to augmentation. Saphenofemoral Junction: No evidence of thrombus. Normal compressibility and flow on color Doppler imaging. Profunda Femoral Vein: No evidence of thrombus. Normal compressibility and flow on color Doppler imaging. Femoral Vein: No evidence of thrombus. Normal compressibility,  respiratory phasicity and response to augmentation. Popliteal Vein: No evidence of thrombus. Normal compressibility, respiratory phasicity and response to augmentation. Calf Veins: No evidence of thrombus. Normal compressibility and flow on color Doppler imaging. Superficial Great Saphenous Vein: No evidence of thrombus. Normal compressibility. Venous Reflux:  None. Other Findings:  None. IMPRESSION: No evidence of  deep venous thrombosis in either lower extremity. Oblong fluid collection seen lateral to the right knee. Please correlate for any known history or dedicated workup when appropriate. Please correlate for any prior history or of infection or trauma. Electronically Signed   By: Ranell Bring M.D.   On: 10/21/2023 15:19     Medications:     aspirin   81 mg Oral Daily   atorvastatin   80 mg Oral QHS   clopidogrel   75 mg Oral Daily   gabapentin   100 mg Oral QHS   heparin   5,000 Units Subcutaneous Q8H   hydrALAZINE   75 mg Oral TID   hydrochlorothiazide   25 mg Oral Daily   insulin  aspart  0-15 Units Subcutaneous TID WC   pantoprazole   40 mg Oral Daily   verapamil   40 mg Oral Q8H   acetaminophen  **OR** acetaminophen  (TYLENOL ) oral liquid 160 mg/5 mL **OR** acetaminophen , hydrALAZINE , LORazepam , mouth rinse, senna-docusate, traMADol   Assessment/ Plan:  [name] is a 63 y.o.  male with past medical conditions including diabetes, hypertension, GERD, osteoarthritis, polycystic kidney disease, who was admitted to [facility] on 10/20/2023 for CVA (cerebral vascular accident) [facility]) [I63.9] Cerebrovascular accident (CVA), unspecified mechanism (HCC) [I63.9]   Acute kidney injury with proteinuria on suspected chronic kidney disease stage IIIb.  Baseline appears to be creatinine 1.88 with GFR 39 on 08/14/2023.  Patient does not currently follow with nephrology.  Renal ultrasound shows mild right kidney atrophy, no obstruction.  No recent IV contrast exposure.  Creatinine 2.22 on ED arrival.   No updated labs available today.  Will order renal function panel for a.m. UA appears yellow with proteinuria.  Protein creatinine ratio 1.99. Patient does not appear in acute distress, will continue to follow-up with patient outpatient.  Lab Results  Component Value Date   CREATININE 2.32 (H) 10/22/2023   CREATININE 2.22 (H) 10/20/2023   CREATININE 1.88 (H) 08/14/2023    Intake/Output Summary (Last  24 hours) at 10/23/2023 1226 Last data filed at 10/23/2023 0900 Gross per 24 hour  Intake 240 ml  Output --  Net 240 ml   2. Anemia of chronic kidney disease Lab Results  Component Value Date   HGB 12.6 (L) 10/22/2023    Hemoglobin within optimal range.  3. Secondary Hyperparathyroidism: with outpatient labs: None available Lab Results  Component Value Date   CALCIUM  8.9 10/22/2023    PTH pending.  Will continue to monitor bone minerals during this admission.  4. Diabetes mellitus type II with chronic kidney disease/renal manifestations: noninsulin dependent. Home regimen includes glipizide . Most recent hemoglobin A1c is 6.1 on 10/20/23.   Primary team to continue management of sliding scale insulin    LOS: 3 Nyko Gell 7/22/202512:26 PM

## 2023-10-23 NOTE — Plan of Care (Signed)
  Problem: Education: Goal: Knowledge of disease or condition will improve 10/23/2023 0527 by Gwenn Melnick, RN Outcome: Progressing 10/23/2023 0350 by Gwenn Melnick, RN Outcome: Progressing   Problem: Education: Goal: Knowledge of secondary prevention will improve (MUST DOCUMENT ALL) 10/23/2023 0527 by Gwenn Melnick, RN Outcome: Progressing 10/23/2023 0350 by Gwenn Melnick, RN Outcome: Progressing   Problem: Education: Goal: Knowledge of patient specific risk factors will improve (DELETE if not current risk factor) 10/23/2023 0527 by Gwenn Melnick, RN Outcome: Progressing 10/23/2023 0350 by Gwenn Melnick, RN Outcome: Progressing   Problem: Coping: Goal: Will verbalize positive feelings about self Outcome: Progressing   Problem: Health Behavior/Discharge Planning: Goal: Ability to manage health-related needs will improve Outcome: Progressing   Problem: Health Behavior/Discharge Planning: Goal: Goals will be collaboratively established with patient/family Outcome: Progressing   Problem: Self-Care: Goal: Verbalization of feelings and concerns over difficulty with self-care will improve Outcome: Progressing

## 2023-10-23 NOTE — Progress Notes (Signed)
 NEUROLOGY CONSULT FOLLOW UP NOTE   Date of service: October 23, 2023 Patient Name: Richard Hunt MRN:  [id number] DOB:  Oct 15, 1960  Interval Hx/subjective   - Feeling better overall  - Headache improved - Intermittent diplopia when ambulating, improves with rest and sitting, also improving overall  Vitals   Vitals:   10/22/23 1930 10/23/23 0019 10/23/23 0507 10/23/23 0741  BP: (!) 158/74 (!) 152/104 (!) 172/92 (!) 152/81  Pulse: 82 74 77 67  Resp: 18  18 20   Temp: 97.8 F (36.6 C) 98.5 F (36.9 C) 97.8 F (36.6 C) 97.6 F (36.4 C)  TempSrc:      SpO2: 100% 99% 98% 98%  Weight:      Height:         Body mass index is 35.62 kg/m.  Current vital signs: BP (!) 152/81 (BP Location: Left Arm)   Pulse 67   Temp 97.6 F (36.4 C)   Resp 20   Ht 5' 5 (1.651 m)   Wt 97.1 kg   SpO2 98%   BMI 35.62 kg/m  Vital signs in last 24 hours: Temp:  [97.6 F (36.4 C)-98.5 F (36.9 C)] 97.6 F (36.4 C) (07/22 0741) Pulse Rate:  [67-90] 67 (07/22 0741) Resp:  [18-20] 20 (07/22 0741) BP: (152-187)/(65-104) 152/81 (07/22 0741) SpO2:  [98 %-100 %] 98 % (07/22 0741)    Physical Exam   General:  Smiling, in much better spirits, breathing comfortably  Neuro: Mental Status:  Patient is awake, alert, appropriately conversant, good attention/concentration, able to teach back about medicines Cranial Nerves: II: Visual Fields are full III,IV, VI: EOMI without ptosis or diploplia. Saccadic pursuits  V: Facial sensation is diminished on the right on yesterday's testing by Dr. Michaela, reports symmetric to me today VII: Facial movement is symmetric.  VIII: hearing is intact to voice Uvula symmetric elevation Tongue midline Shoulder shrug symmetric Motor: Tone is normal. Bulk is normal. 5/5 strength thoughout today, I do not appreciate prior subtle left sided weakness today Sensory: Sensation is symmetric to light touch and temperature in the arms and legs. Reports  only transient left sided sensory symptoms several months ago lasting about 20 min Cerebellar: FNF and  heel to shin intact bilaterally other than slight LUE tremor on end reach   Medications  Current Facility-Administered Medications:    acetaminophen  (TYLENOL ) tablet 650 mg, 650 mg, Oral, Q4H PRN, 650 mg at 10/22/23 0849 **OR** acetaminophen  (TYLENOL ) 160 MG/5ML solution 650 mg, 650 mg, Per Tube, Q4H PRN **OR** acetaminophen  (TYLENOL ) suppository 650 mg, 650 mg, Rectal, Q4H PRN, Swayze, Ava, DO   aspirin  chewable tablet 81 mg, 81 mg, Oral, Daily, Swayze, Ava, DO, 81 mg at 10/23/23 0903   atorvastatin  (LIPITOR ) tablet 80 mg, 80 mg, Oral, QHS, Nieshia Larmon L, MD   clopidogrel  (PLAVIX ) tablet 75 mg, 75 mg, Oral, Daily, Swayze, Ava, DO, 75 mg at 10/23/23 0902   gabapentin  (NEURONTIN ) capsule 100 mg, 100 mg, Oral, QHS, Ayiku, Aida, MD, 100 mg at 10/22/23 2218   heparin  injection 5,000 Units, 5,000 Units, Subcutaneous, Q8H, Swayze, Ava, DO, 5,000 Units at 10/23/23 9384   hydrALAZINE  (APRESOLINE ) injection 10 mg, 10 mg, Intravenous, Q6H PRN, Jens Aida, MD   hydrALAZINE  (APRESOLINE ) tablet 100 mg, 100 mg, Oral, TID, Ayiku, Bernard, MD, 100 mg at 10/23/23 9097   hydrochlorothiazide  (HYDRODIURIL ) tablet 25 mg, 25 mg, Oral, Daily, Ayiku, Bernard, MD, 25 mg at 10/23/23 0902   insulin  aspart (novoLOG ) injection 0-15 Units, 0-15 Units, Subcutaneous,  TID WC, Swayze, Ava, DO, 3 Units at 10/22/23 1357   LORazepam  (ATIVAN ) tablet 0.5 mg, 0.5 mg, Oral, Once PRN, Swayze, Ava, DO   Oral care mouth rinse, 15 mL, Mouth Rinse, PRN, Swayze, Ava, DO   pantoprazole  (PROTONIX ) EC tablet 40 mg, 40 mg, Oral, Daily, Swayze, Ava, DO, 40 mg at 10/23/23 0902   senna-docusate (Senokot-S) tablet 1 tablet, 1 tablet, Oral, QHS PRN, Swayze, Ava, DO   traMADol  (ULTRAM ) tablet 50 mg, 50 mg, Oral, Q8H PRN, Jens Durand, MD, 50 mg at 10/23/23 0902   verapamil  (CALAN ) tablet 40 mg, 40 mg, Oral, Q8H, Jeannine Pennisi L, MD,  40 mg at 10/23/23 0612  Labs and Diagnostic Imaging   Additional workup: ESR 63 (0-20 ref range) CRP 0.8 (WNL) HIV neg SSA, SSB neg  ana w/ reflex to broader lupus panel neg  CSF: WBC 8, RBC 288, Pro 68, Glu 73 (serum 114)  Pending Serum: ANCA, C3, C4,  cryoglobulin, igg/iga/igm, Rheumatoid factor, hepatitis testing, RPR, SPEP, Urine: UPEP CSF: IgG Index and oligoclonal bands  CBC:  Recent Labs  Lab 10/20/23 1300 10/22/23 0520  WBC 10.0 8.2  NEUTROABS 7.0  --   HGB 12.4* 12.6*  HCT 34.7* 34.5*  MCV 87.0 86.0  PLT 235 227    Basic Metabolic Panel:  Lab Results  Component Value Date   NA 135 10/22/2023   K 3.9 10/22/2023   CO2 24 10/22/2023   GLUCOSE 130 (H) 10/22/2023   BUN 28 (H) 10/22/2023   CREATININE 2.32 (H) 10/22/2023   CALCIUM  8.9 10/22/2023   GFRNONAA 30 (L) 10/22/2023   GFRAA >60 12/12/2016   Lipid Panel:  Lab Results  Component Value Date   LDLCALC 74 10/21/2023   HgbA1c:  Lab Results  Component Value Date   HGBA1C 6.1 (H) 10/20/2023     Urine Drug Screen:     Component Value Date/Time   LABOPIA NONE DETECTED 10/20/2023 1618   COCAINSCRNUR NONE DETECTED 10/20/2023 1618   LABBENZ NONE DETECTED 10/20/2023 1618   AMPHETMU NONE DETECTED 10/20/2023 1618   THCU NONE DETECTED 10/20/2023 1618   LABBARB NONE DETECTED 10/20/2023 1618    Alcohol Level     Component Value Date/Time   ETH <5 12/10/2016 0122   INR  Lab Results  Component Value Date   INR 1.0 10/20/2023   APTT  Lab Results  Component Value Date   APTT 29 10/20/2023    CT Head without contrast(Personally reviewed): 1. Small age-indeterminate infarct within the right cerebellar hemisphere. Consider a brain MRI for further evaluation. 2. Moderate chronic small vessel ischemic changes within the cerebral white matter. 3. Mild paranasal sinus mucosal thickening. 4. Left mastoid effusion (with bilateral mastoid air cell sclerosis).  CT angio Chest / Aorta (Personally  reviewed):  1. No acute intrathoracic pathology. No CT evidence of aortic aneurysm or dissection. [ moderate calcified and noncalcified plaque of the aortic arch] 2. Three-vessel coronary vascular calcification. 3.  Aortic Atherosclerosis (ICD10-I70.0).   MR Angio head without contrast and Neck w/ and w/o contrast (Personally reviewed): 1. 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 2. The right ICA terminus appears occluded past the ophthalmic segment. 3. 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 4. Severe stenosis of the right PCA P2 segment. 5. 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 6. Severe, near complete stenosis of the distal left vertebral artery.  MRI Brain(Personally  reviewed): 1. Patchy small acute/subacute infarcts scattered within the callosal splenium on the left, and within the bilateral parietooccipital lobes. 2. Background age-advanced chronic small vessel ischemic disease with multiple chronic lacunar infarcts, as described. 3. Few nonspecific chronic microhemorrhages within the supratentorial brain. 4. Asymmetrically diminutive right internal carotid artery at the imaged levels suspicious for the presence of a non-visualized upstream (more proximal) stenosis within the neck. Consider CT or MR angiography further evaluation. 5. Minor paranasal sinus mucosal thickening. 6. Trace fluid within left mastoid air cells.    Assessment   Richard Hunt is a 63 y.o. man with several weeks of blurred vision, headaches.  He has multiple reasons for atherosclerotic disease, which may explain his symptoms (headaches and strokes with mild left sided weakness / sensory changes), however given his progressive headaches assessed further to rule out underlying process. With improving headache today and overall labs other than elevated ESR  reassuring, less concern for vasculitis at this time. 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, will need to very cautiously/gradually address BP   Recommendations  -Follow-up pending labs listed above under labs -Continue lipitor  80mg  at bedtime (increased from home 40 mg at bedtime) -Continue DAPT with ASA and plavix , 90-day course, followed by Plavix  monotherapy going forward, with patient counseled to switch to -aspirin  monotherapy if insurance/medication accessibility issues arise -Permissive hypertension, okay to start gradually normalizing blood pressure today, but if he becomes symptomatic with lower blood pressure may need a higher blood pressure goal; target decrease by approx 10 mmHg daily until normotension achieved  -Compazine  10mg  IV for headache received 7/20 evening -Continue verapamil  for dual headache and BP control, gradually uptitrate based on BP response. Discontinued amlodipine  7/21 -Outpatient obtain CTA head and neck when renal function allows or repeat MRA head and neck if renal function not improving to monitor for progression within 2-3 months  -Will start with medical optimization (DAPT) but if has more left sided symptoms in particular may need vascular surgery referral for consideration of right ICA intervention (reported only one transient episode of left sided numbness to me, he reports was several months ago for 20-30 min in duration) -Outpatient neurology follow-up  -Appreciate management of comorbidities per primary team and nephrology. Inpatient neurology will sign  off at this time, please reach out with any additional questions/concerns -Discussed with Dr. Jens in person and via secure chat  ______________________________________________________________________   Lola Jernigan MD-PhD [facility] [phone]  [facility] coverage for [facility] is from 8 AM to 4 AM in-house and 4 PM to 8 PM by telephone/video. 8 PM to 8 AM emergent questions or overnight urgent questions should be addressed to Teleneurology On-call or [facility] neurohospitalist; contact information can be found on AMION  I personally spent a total of 60 minutes in the care of the patient today including preparing to see the patient, getting/reviewing separately obtained history, performing a medically appropriate exam/evaluation, counseling and educating, placing orders, referring and communicating with other health care professionals, documenting clinical information in the EHR, independently interpreting results, communicating results, and coordinating care. In person interpreter was used

## 2023-10-23 NOTE — Plan of Care (Signed)
  Problem: Self-Care: Goal: Verbalization of feelings and concerns over difficulty with self-care will improve Outcome: Progressing   Problem: Education: Goal: Knowledge of patient specific risk factors will improve (DELETE if not current risk factor) Outcome: Progressing   Problem: Education: Goal: Knowledge of secondary prevention will improve (MUST DOCUMENT ALL) Outcome: Progressing   Problem: Education: Goal: Knowledge of disease or condition will improve Outcome: Progressing

## 2023-10-24 DIAGNOSIS — I1A0 Resistant hypertension: Secondary | ICD-10-CM

## 2023-10-24 DIAGNOSIS — I639 Cerebral infarction, unspecified: Secondary | ICD-10-CM | POA: Diagnosis not present

## 2023-10-24 LAB — RENAL FUNCTION PANEL
Albumin: 3.7 g/dL (ref 3.5–5.0)
Anion gap: 12 (ref 5–15)
BUN: 31 mg/dL — ABNORMAL HIGH (ref 8–23)
CO2: 23 mmol/L (ref 22–32)
Calcium: 9.2 mg/dL (ref 8.9–10.3)
Chloride: 97 mmol/L — ABNORMAL LOW (ref 98–111)
Creatinine, Ser: 2.51 mg/dL — ABNORMAL HIGH (ref 0.61–1.24)
GFR, Estimated: 28 mL/min — ABNORMAL LOW (ref >60)
Glucose, Bld: 131 mg/dL — ABNORMAL HIGH (ref 70–99)
Phosphorus: 3.8 mg/dL (ref 2.5–4.6)
Potassium: 3.9 mmol/L (ref 3.5–5.1)
Sodium: 132 mmol/L — ABNORMAL LOW (ref 135–145)

## 2023-10-24 LAB — IGG CSF INDEX
Albumin CSF-mCnc: 41 mg/dL (ref 15–55)
Albumin: 4.3 g/dL (ref 3.9–4.9)
CSF IgG Index: 0.7 (ref 0.0–0.7)
IgG (Immunoglobin G), Serum: 1312 mg/dL (ref 603–1613)
IgG, CSF: 8.3 mg/dL (ref 0.0–10.3)
IgG/Alb Ratio, CSF: 0.2 (ref 0.00–0.25)

## 2023-10-24 LAB — GLUCOSE, CAPILLARY
Glucose-Capillary: 137 mg/dL — ABNORMAL HIGH (ref 70–99)
Glucose-Capillary: 166 mg/dL — ABNORMAL HIGH (ref 70–99)
Glucose-Capillary: 145 mg/dL — ABNORMAL HIGH (ref 70–99)
Glucose-Capillary: 130 mg/dL — ABNORMAL HIGH (ref 70–99)

## 2023-10-24 LAB — PROTEIN ELECTRO, RANDOM URINE
Albumin ELP, Urine: 60.3 %
Alpha-1-Globulin, U: 7.8 %
Alpha-2-Globulin, U: 6 %
Beta Globulin, U: 13.7 %
Gamma Globulin, U: 12.3 %
Total Protein, Urine: 214.2 mg/dL

## 2023-10-24 LAB — ANCA TITERS
Atypical P-ANCA titer: <1:20 {titer}
C-ANCA: <1:20 {titer}
P-ANCA: <1:20 {titer}

## 2023-10-24 LAB — PARATHYROID HORMONE, INTACT (NO CA): PTH: 61 pg/mL (ref 15–65)

## 2023-10-24 MED ORDER — POLYETHYLENE GLYCOL 3350 17 G PO PACK
17.0000 g | PACK | Freq: Every day | ORAL | Status: DC
  Administered 2023-10-24 – 2023-10-26 (×3): 17 g via ORAL
  Filled 2023-10-24 (×3): qty 1

## 2023-10-24 MED ORDER — HYDRALAZINE HCL 50 MG PO TABS
100.0000 mg | ORAL_TABLET | Freq: Three times a day (TID) | ORAL | Status: DC
  Administered 2023-10-24 – 2023-10-26 (×7): 100 mg via ORAL
  Filled 2023-10-24 (×7): qty 2

## 2023-10-24 NOTE — Plan of Care (Signed)
  Problem: Education: Goal: Knowledge of disease or condition will improve 10/24/2023 0435 by Gwenn Melnick, RN Outcome: Progressing 10/24/2023 0434 by Gwenn Melnick, RN Outcome: Progressing Goal: Knowledge of secondary prevention will improve (MUST DOCUMENT ALL) Outcome: Progressing Goal: Knowledge of patient specific risk factors will improve (DELETE if not current risk factor) 10/24/2023 0435 by Gwenn Melnick, RN Outcome: Progressing 10/24/2023 0434 by Gwenn Melnick, RN Outcome: Progressing   Problem: Ischemic Stroke/TIA Tissue Perfusion: Goal: Complications of ischemic stroke/TIA will be minimized 10/24/2023 0435 by Gwenn Melnick, RN Outcome: Progressing 10/24/2023 0434 by Gwenn Melnick, RN Outcome: Progressing   Problem: Coping: Goal: Will verbalize positive feelings about self Outcome: Progressing Goal: Will identify appropriate support needs Outcome: Progressing   Problem: Education: Goal: Ability to describe self-care measures that may prevent or decrease complications (Diabetes Survival Skills Education) will improve Outcome: Progressing   Problem: Coping: Goal: Ability to adjust to condition or change in health will improve Outcome: Progressing

## 2023-10-24 NOTE — Plan of Care (Signed)
  Problem: Education: Goal: Knowledge of disease or condition will improve Outcome: Progressing Goal: Knowledge of patient specific risk factors will improve (DELETE if not current risk factor) Outcome: Progressing   Problem: Ischemic Stroke/TIA Tissue Perfusion: Goal: Complications of ischemic stroke/TIA will be minimized Outcome: Progressing   Problem: Coping: Goal: Will identify appropriate support needs Outcome: Progressing

## 2023-10-24 NOTE — Progress Notes (Signed)
 Progress Note   Patient: Richard Hunt FMW:[id number] DOB: 22-Apr-1960 DOA: 10/20/2023     4 DOS: the patient was seen and examined on 10/24/2023   Brief hospital course: Richard Hunt is a 63 y.o. male with medical history significant hypertension, hyperlipidemia, diabetes mellitus, CKD stage IIIb with proteinuria, chronic venous insufficiency, who presented to the hospital because of headache and intermittent dizziness for over 2 months.  He is a known hypertensive urgency and has been medically adherent with medications.  Blood pressure has still been uncontrolled.  Family reported that sometimes his speech has been difficult to understand.   In the ED, BP was 197/140.   MRI brain showed small acute/subacute infarct scattered within the callosal splenium on the left and within the bilateral parieto-occipital lobes.  Further hospital course and management as outlined below.    Assessment and Plan:  Acute ischemic stroke, suspect embolic based on distribution:  Neurologist recommended dual antiplatelet therapy with aspirin  and Plavix  for 3 months followed by Plavix  monotherapy 2D echo showed EF estimated at 60 to 65%, moderate concentric LVH, grade 1 diastolic dysfunction and no evidence of interatrial shunt. Lower extremity pain: Pain is better.  Venous duplex did not show any evidence of DVT. MRA head and neck showed severe narrowing at different levels of of right ICA, right MCA, right PCA and occlusion of right vertebral artery just proximal to the vertebrobasilar confluence, and severe near complete stenosis of the distal left vertebral artery.. 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   Intermittent diplopia - with ambulating, improves with rest Right ICA Stenosis --Neurology has signed off --Continue verapamil , dose increased in favor of upward titration of hydralazine  per neurologist 7/23 BP this AM 175/90, pt with headache --Increase hydralazine  75 >> 100 mg TID  --Outpatient vascular surgery referral to consider Right ICA intervention if further left sided symptoms develop     Hypertensive urgency: BP improved but remains elevated.   --Continue verapamil , hydralazine  and hydrochlorothiazide . 7/23 -- increased hydralazine  75 >> 100 mg TID     Type II DM with hyperglycemia:  Well controlled with Hemoglobin A1c 6.1 % --Sliding scale NovoLog  --Hold glimepiride.     Probable AKI on CKD stage IIIb, proteinuria:  Suspect baseline CKD IIIb --Nephrology consulted   Normal C3-C4 levels no M spike on protein electrophoresis. ANA, Sjogren syndrome AMB antibodies, IgG, IgA and IgM antibodies were all normal. ANCA titers, cryoglobulin, oligoclonal CSF bands are pending. --Monitor renal function   Chart review showed that baseline creatinine between 1.8-2.4. Urine albumin creatinine ratio was 1,974.1 on 08/17/2023.     Hyperlipidemia: Controlled.  LDL 74, HDL 41, triglycerides 113 and total cholesterol 138.  --Continue Lipitor         Subjective: Pt awake resting in bed this AM on rounds.  He reports a headache, no vision changes.  Pt expresses concern about his elevated BP and does not feel ready to go home with BP still in 170's this AM.  Denies CP, SOB or any new one-sided weakness numbness  or tingling.  Physical Exam: Vitals:   10/23/23 2334 10/24/23 0543 10/24/23 0857 10/24/23 1200  BP: (!) 161/81 (!) 173/81 (!) 175/90 (!) 164/90   Pulse: 69 70 75 72  Resp: 16     Temp: 98.4 F (36.9 C) 98.4 F (36.9 C) 97.7 F (36.5 C) (!) 97.4 F (36.3 C)  TempSrc:   Oral Oral  SpO2: 98% 97% 97% 97%  Weight:      Height:       General exam: awake, alert, no acute distress HEENT: moist mucus membranes, hearing grossly normal  Respiratory system: CTAB no wheezes, rales or rhonchi, normal respiratory effort. Cardiovascular system: normal S1/S2, RRR,no pedal edema.   Gastrointestinal system: soft, NT, ND, no HSM felt, +bowel sounds. Central nervous system: A&O x 3. no gross focal neurologic deficits, normal speech Extremities: moves all, no edema, normal tone Skin: dry, intact, normal temperature Psychiatry: normal mood, congruent affect, judgement and insight appear normal    Data Reviewed:  Notable labs --  Na 132 Cl 97 Glucose 131 BUN 31 Cr 2.51  CBG's at foal in mid 100's  Family Communication: None. Pt updated in detail.  Disposition: Status is: Inpatient Remains inpatient appropriate because: Ongoing close monitoring and medication changes underway for uncontrolled hypertension    Planned Discharge Destination: Home    Time spent: 42 minutes  Author: Burnard DELENA Cunning, DO 10/24/2023 12:52 PM  For on call review [email or web address].

## 2023-10-24 NOTE — Plan of Care (Signed)
  Problem: Education: Goal: Knowledge of disease or condition will improve Outcome: Progressing Goal: Knowledge of secondary prevention will improve (MUST DOCUMENT ALL) Outcome: Progressing Goal: Knowledge of patient specific risk factors will improve (DELETE if not current risk factor) Outcome: Progressing   Problem: Ischemic Stroke/TIA Tissue Perfusion: Goal: Complications of ischemic stroke/TIA will be minimized Outcome: Progressing   Problem: Health Behavior/Discharge Planning: Goal: Ability to manage health-related needs will improve Outcome: Progressing Goal: Goals will be collaboratively established with patient/family Outcome: Progressing   Problem: Self-Care: Goal: Ability to participate in self-care as condition permits will improve Outcome: Progressing Goal: Verbalization of feelings and concerns over difficulty with self-care will improve Outcome: Progressing Goal: Ability to communicate needs accurately will improve Outcome: Progressing   Problem: Education: Goal: Ability to describe self-care measures that may prevent or decrease complications (Diabetes Survival Skills Education) will improve Outcome: Progressing Goal: Individualized Educational Video(s) Outcome: Progressing   Problem: Health Behavior/Discharge Planning: Goal: Ability to identify and utilize available resources and services will improve Outcome: Progressing Goal: Ability to manage health-related needs will improve Outcome: Progressing   Problem: Skin Integrity: Goal: Risk for impaired skin integrity will decrease Outcome: Progressing   Problem: Clinical Measurements: Goal: Ability to maintain clinical measurements within normal limits will improve Outcome: Progressing Goal: Will remain free from infection Outcome: Progressing Goal: Diagnostic test results will improve Outcome: Progressing Goal: Respiratory complications will improve Outcome: Progressing Goal: Cardiovascular  complication will be avoided Outcome: Progressing   Problem: Coping: Goal: Level of anxiety will decrease Outcome: Progressing   Problem: Skin Integrity: Goal: Risk for impaired skin integrity will decrease Outcome: Progressing

## 2023-10-24 NOTE — Progress Notes (Signed)
 [facility]  ROUNDING NOTE   Subjective:   Patient seen sitting up in chair Appetite appropriate Denies nausea or vomiting Room air Complaints of some lower back pain  Creatinine 2.51  Objective:  Vital signs in last 24 hour Temp:  [97.4 F (36.3 C)-98.6 F (37 C)] 97.7 F (36.5 C) (07/23 0857) Pulse Rate:  [69-75] 75 (07/23 0857) Resp:  [14-20] 16 (07/22 2334) BP: (161-175)/(81-90) 175/90 (07/23 0857) SpO2:  [97 %-100 %] 97 % (07/23 0857)  Weight change:  Filed Weights   10/20/23 1146  Weight: 97.1 kg    Intake/Output: I/O last 3 completed shifts: In: 240 [P.O.:240] Out: -    Intake/Output this shift:  Total I/O In: 240 [P.O.:240] Out: -   Physical Exam: General: NAD  Head: Normocephalic, atraumatic. Moist oral mucosal membranes  Eyes: Anicteric  Neck: Supple  Lungs:  Clear to auscultation, normal effort, room air  Heart: Regular rate and rhythm  Abdomen:  Soft, nontender  Extremities: No peripheral edema.  Neurologic: Awake, alert, conversant  Skin: Warm,dry, no rash       Basic Metabolic Panel: Recent Labs  Lab 10/20/23 1300 10/22/23 0520 10/24/23 0525  NA 140 135 132*  K 3.9 3.9 3.9  CL 108 101 97*  CO2 23 24 23   GLUCOSE 129* 130* 131*  BUN 34* 28* 31*  CREATININE 2.22* 2.32* 2.51*  CALCIUM  8.7* 8.9 9.2  PHOS  --   --  3.8    Liver Function Tests: Recent Labs  Lab 10/24/23 0525  ALBUMIN 3.7   No results for input(s): LIPASE, AMYLASE in the last 168 hours. No results for input(s): AMMONIA in the last 168 hours.  CBC: Recent Labs  Lab 10/20/23 1300 10/22/23 0520  WBC 10.0 8.2  NEUTROABS 7.0  --   HGB 12.4* 12.6*  HCT 34.7* 34.5*  MCV 87.0 86.0  PLT 235 227    Cardiac Enzymes: No results for input(s): CKTOTAL, CKMB, CKMBINDEX, TROPONINI in the last 168 hours.  BNP: Invalid input(s): POCBNP  CBG: Recent Labs  Lab 10/22/23 2109 10/23/23 0735 10/23/23 1150 10/23/23 1656 10/24/23 0842   GLUCAP 165* 113* 149* 146* 137*    Microbiology: Results for orders placed or performed during the hospital encounter of 10/20/23  CSF culture w Gram Stain     Status: None (Preliminary result)   Collection Time: 10/22/23  1:36 PM   Specimen: PATH Cytology CSF; Cerebrospinal Fluid  Result Value Ref Range Status   Specimen Description   Final    CSF Performed at [facility], [address] [place], [place] [address]    Special Requests   Final    NONE Performed at [facility], [address] [place], [place] [address]    Gram Stain   Final    RED BLOOD CELLS PRESENT WBC SEEN NO ORGANISMS SEEN CYTOSPIN SMEAR    Culture   Final    NO GROWTH 2 DAYS Performed at [facility], [address], [place], [place] [address]    Report Status PENDING  Incomplete    Coagulation Studies: No results for input(s): LABPROT, INR in the last 72 hours.   Urinalysis: Recent Labs    10/22/23 2040  COLORURINE YELLOW*  LABSPEC 1.012  PHURINE 6.0  GLUCOSEU 50*  HGBUR NEGATIVE  BILIRUBINUR NEGATIVE  KETONESUR NEGATIVE  PROTEINUR >=300*  NITRITE NEGATIVE  LEUKOCYTESUR NEGATIVE      Imaging: DG FL GUIDED LUMBAR PUNCTURE Result Date: 10/22/2023 CLINICAL DATA:  63 year old male who presented to the ED with headaches, blurry vision, and unsteadiness on his feet. Reports increasing frequency and severity of headaches over the past few months. Imaging significant for small-age indeterminate infarct in the right cerebellar hemisphere and few nonspecific chronic microhemorrhages. Request for lumbar puncture to rule out additional underlying processes. EXAM: LUMBAR PUNCTURE UNDER FLUOROSCOPY PROCEDURE: An appropriate skin entry site was determined fluoroscopically. Operator donned sterile gloves and mask. Skin site was marked, then prepped with Betadine, draped in usual sterile fashion, and infiltrated locally with 1% lidocaine . A 5-inch 22 gauge spinal needle  advanced into the thecal sac at L3-4 from a left interlaminar approach. Clear colorless CSF spontaneously returned, with opening pressure of 12 cm water. 7 ml CSF were collected and divided among 4 sterile vials for the requested laboratory studies. The needle was then removed. The patient tolerated the procedure well and there were no complications. FLUOROSCOPY: Radiation Exposure Index (as provided by the fluoroscopic device): 44.5 mGy Kerma IMPRESSION: Technically successful lumbar puncture under fluoroscopy. This exam was performed by [facility] PA-C, and was supervised and interpreted by Dr. ONEIDA Specking. Electronically Signed   By: CHRISTELLA.  Shick M.D.   On: 10/22/2023 13:55     Medications:     aspirin   81 mg Oral Daily   atorvastatin   80 mg Oral QHS   clopidogrel   75 mg Oral Daily   gabapentin   100 mg Oral QHS   heparin   5,000 Units Subcutaneous Q8H   hydrALAZINE   100 mg Oral TID   hydrochlorothiazide   25 mg Oral Daily   insulin  aspart  0-15 Units Subcutaneous TID WC   pantoprazole   40 mg Oral Daily   polyethylene glycol  17 g Oral Daily   verapamil   40 mg Oral Q8H   acetaminophen  **OR** acetaminophen  (TYLENOL ) oral liquid 160 mg/5 mL **OR** acetaminophen , hydrALAZINE , LORazepam , mouth rinse, senna-docusate, traMADol   Assessment/ Plan:  Richard Hunt is a 63 y.o.  male with past medical conditions including diabetes, hypertension, GERD, osteoarthritis, polycystic kidney disease, who was admitted to [facility] on 10/20/2023 for CVA (cerebral vascular accident) [facility]) [I63.9] Cerebrovascular accident (CVA), unspecified mechanism (HCC) [I63.9]   Acute kidney injury with proteinuria on suspected chronic kidney disease stage IIIb.  Baseline appears to be creatinine 1.88 with GFR 39 on 08/14/2023.  Patient does not currently follow with nephrology.  Renal ultrasound shows mild right kidney atrophy, no obstruction.  No recent IV contrast exposure.  Creatinine 2.22 on ED arrival.    Creatinine slightly elevated, but stable UA appears yellow with proteinuria.  Protein creatinine ratio 1.99. Will follow up with patient outpatient  Lab Results  Component Value Date   CREATININE 2.51 (H) 10/24/2023   CREATININE 2.32 (H) 10/22/2023   CREATININE 2.22 (H) 10/20/2023    Intake/Output Summary (Last 24 hours) at 10/24/2023 1204 Last data filed at 10/24/2023 1040 Gross per 24 hour  Intake 240 ml  Output --  Net 240 ml   2. Anemia of chronic kidney disease Lab Results  Component Value Date   HGB 12.6 (L) 10/22/2023    Hemoglobin at goal  3. Secondary Hyperparathyroidism: with outpatient labs: None available Lab Results  Component Value Date   CALCIUM  9.2 10/24/2023   PHOS 3.8 10/24/2023    PTH pending.  Bone minerals acceptable   4. Diabetes mellitus type II with chronic kidney disease/renal manifestations: noninsulin dependent. Home regimen includes glipizide . Most recent hemoglobin A1c is 6.1 on 10/20/23.   Glucose well controlled. Primary  team to continue management of sliding scale insulin    LOS: 4 Delaina Fetsch 7/23/202512:04 PM

## 2023-10-25 DIAGNOSIS — I639 Cerebral infarction, unspecified: Secondary | ICD-10-CM | POA: Diagnosis not present

## 2023-10-25 LAB — GLUCOSE, CAPILLARY
Glucose-Capillary: 113 mg/dL — ABNORMAL HIGH (ref 70–99)
Glucose-Capillary: 112 mg/dL — ABNORMAL HIGH (ref 70–99)
Glucose-Capillary: 143 mg/dL — ABNORMAL HIGH (ref 70–99)
Glucose-Capillary: 118 mg/dL — ABNORMAL HIGH (ref 70–99)
Glucose-Capillary: 142 mg/dL — ABNORMAL HIGH (ref 70–99)

## 2023-10-25 LAB — BASIC METABOLIC PANEL WITH GFR
Anion gap: 12 (ref 5–15)
BUN: 32 mg/dL — ABNORMAL HIGH (ref 8–23)
CO2: 24 mmol/L (ref 22–32)
Calcium: 9 mg/dL (ref 8.9–10.3)
Chloride: 98 mmol/L (ref 98–111)
Creatinine, Ser: 2.4 mg/dL — ABNORMAL HIGH (ref 0.61–1.24)
GFR, Estimated: 29 mL/min — ABNORMAL LOW (ref >60)
Glucose, Bld: 112 mg/dL — ABNORMAL HIGH (ref 70–99)
Potassium: 3.8 mmol/L (ref 3.5–5.1)
Sodium: 134 mmol/L — ABNORMAL LOW (ref 135–145)

## 2023-10-25 LAB — ANTIPHOSPHOLIPID SYNDROME EVAL, BLD
Anticardiolipin IgA: <9 U/mL (ref 0–11)
Anticardiolipin IgG: <9 GPL U/mL (ref 0–14)
Anticardiolipin IgM: <9 [MPL'U]/mL (ref 0–12)
DRVVT: 27.6 s (ref 0.0–47.0)
PTT Lupus Anticoagulant: 31.1 s (ref 0.0–43.5)
Phosphatydalserine, IgA: <1 {APS'U} (ref 0–19)
Phosphatydalserine, IgG: 9 U (ref 0–30)
Phosphatydalserine, IgM: 15 U (ref 0–30)

## 2023-10-25 MED ORDER — VERAPAMIL HCL 40 MG PO TABS
80.0000 mg | ORAL_TABLET | Freq: Three times a day (TID) | ORAL | Status: DC
  Administered 2023-10-25 – 2023-10-26 (×4): 80 mg via ORAL
  Filled 2023-10-25: qty 1
  Filled 2023-10-25 (×4): qty 2

## 2023-10-25 NOTE — Progress Notes (Signed)
 Progress Note   Patient: Richard Hunt FMW:[id number] DOB: Nov 25, 1960 DOA: 10/20/2023     5 DOS: the patient was seen and examined on 10/25/2023   Brief hospital course: Jermain Curt is a 63 y.o. male with medical history significant hypertension, hyperlipidemia, diabetes mellitus, CKD stage IIIb with proteinuria, chronic venous insufficiency, who presented to the hospital because of headache and intermittent dizziness for over 2 months.  He is a known hypertensive urgency and has been medically adherent with medications.  Blood pressure has still been uncontrolled.  Family reported that sometimes his speech has been difficult to understand.   In the ED, BP was 197/140.   MRI brain showed small acute/subacute infarct scattered within the callosal splenium on the left and within the bilateral parieto-occipital lobes.  Further hospital course and management as outlined below.    Assessment and Plan:  Acute ischemic stroke, suspect embolic based on distribution:  Neurologist recommended dual antiplatelet therapy with aspirin  and Plavix  for 3 months followed by Plavix  monotherapy 2D echo showed EF estimated at 60 to 65%, moderate concentric LVH, grade 1 diastolic dysfunction and no evidence of interatrial shunt. Lower extremity pain: Pain is better.  Venous duplex did not show any evidence of DVT. MRA head and neck showed severe narrowing at different levels of of right ICA, right MCA, right PCA and occlusion of right vertebral artery just proximal to the vertebrobasilar confluence, and severe near complete stenosis of the distal left vertebral artery.. 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   Intermittent diplopia - with ambulating, improves with rest Right ICA Stenosis --Neurology has signed off --Continue verapamil , dose increased in favor of upward titration of hydralazine  per neurologist --Outpatient vascular surgery referral to consider Right ICA intervention if further left sided symptoms develop     Hypertensive urgency: BP improved but remains elevated.   --Continue verapamil , hydralazine  and hydrochlorothiazide . --7/23 -- Increase hydralazine  75 >> 100 mg TID  -- 7/24 -- Increase verapamil  40 >> 80 mg TID --Monitor BP closely & continue to titrate regimen Per Neurology: ...if he becomes symptomatic with lower blood pressure may need a higher blood pressure goal; target decrease by approx 10 mmHg daily until normotension     Type II DM with hyperglycemia:  Well controlled with Hemoglobin A1c 6.1 % --Sliding scale NovoLog  --Hold glimepiride.     Probable AKI on CKD stage IIIb, proteinuria:  Suspect baseline CKD IIIb --Nephrology consulted   Normal C3-C4 levels no M spike on protein electrophoresis. ANA, Sjogren syndrome AMB antibodies, IgG, IgA and IgM antibodies were all normal. ANCA titers, cryoglobulin, oligoclonal CSF bands are pending. --Monitor renal function   Chart review showed that baseline creatinine between 1.8-2.4. Urine albumin creatinine ratio was 1,974.1 on 08/17/2023.     Hyperlipidemia: Controlled.  LDL 74, HDL 41, triglycerides 113 and total cholesterol 138.  --Continue Lipitor         Subjective: Pt awake resting in bed this AM on rounds.  He reports a headache earlier but improved after getting medication.  He continues to be worried about BP control and expressed fear of having a stroke at home, not wanting  to go home until he feels better.   Physical Exam: Vitals:   10/25/23 0005 10/25/23 0405 10/25/23 0835 10/25/23 1214  BP: (!) 156/72 (!) 168/82 (!) 170/82 (!) 174/78  Pulse: 74 74 69 80  Resp: 20  20 20   Temp: 98.8 F (37.1 C) 98.6 F (37 C) (!) 97.5 F (36.4 C) 97.6 F (36.4 C)  TempSrc:  Oral    SpO2: 98% 99% 99% 97%  Weight:      Height:       General exam: awake, alert, no acute distress HEENT: moist mucus membranes, hearing grossly normal  Respiratory system: on room air, normal respiratory effort. Cardiovascular system: RRR,no pedal edema.   Gastrointestinal system: soft, NT, ND Central nervous system: A&O x 3. no gross focal neurologic deficits, normal speech Extremities: moves all, no edema, normal tone Skin: dry, intact, normal temperature Psychiatry: normal mood, congruent affect, judgement and insight appear normal    Data Reviewed:  Notable labs --  Na 132 >> 134 Glucose 112 BUN 32 Cr 2.51 >> 2.40  CBG's at goal in mid 100's  Family Communication: None. Pt updated in detail.  Disposition: Status is: Inpatient Remains inpatient appropriate because: BP uncontrolled, ongoing close monitoring and medication changes underway     Planned Discharge Destination: Home    Time spent: 38 minutes  Author: Burnard DELENA Cunning, DO 10/25/2023 1:16 PM  For on call review [email or web address].

## 2023-10-25 NOTE — Progress Notes (Signed)
 [facility]  ROUNDING NOTE   Subjective:   Patient seen sitting at side of bed No complaints to offer  Creatinine 2.40  Objective:  Vital signs in last 24 hour Temp:  [97.4 F (36.3 C)-98.8 F (37.1 C)] 97.5 F (36.4 C) (07/24 0835) Pulse Rate:  [69-74] 69 (07/24 0835) Resp:  [14-20] 20 (07/24 0835) BP: (156-188)/(70-90) 170/82 (07/24 0835) SpO2:  [97 %-100 %] 99 % (07/24 0835)  Weight change:  Filed Weights   10/20/23 1146  Weight: 97.1 kg    Intake/Output: I/O last 3 completed shifts: In: 360 [P.O.:360] Out: -    Intake/Output this shift:  No intake/output data recorded.  Physical Exam: General: NAD  Head: Normocephalic, atraumatic. Moist oral mucosal membranes  Eyes: Anicteric  Neck: Supple  Lungs:  Clear to auscultation, normal effort, room air  Heart: Regular rate and rhythm  Abdomen:  Soft, nontender  Extremities: No peripheral edema.  Neurologic: Awake, alert, conversant  Skin: Warm,dry, no rash       Basic Metabolic Panel: Recent Labs  Lab 10/20/23 1300 10/22/23 0520 10/24/23 0525 10/25/23 0525  NA 140 135 132* 134*  K 3.9 3.9 3.9 3.8  CL 108 101 97* 98  CO2 23 24 23 24   GLUCOSE 129* 130* 131* 112*  BUN 34* 28* 31* 32*  CREATININE 2.22* 2.32* 2.51* 2.40*  CALCIUM  8.7* 8.9 9.2 9.0  PHOS  --   --  3.8  --     Liver Function Tests: Recent Labs  Lab 10/22/23 1336 10/24/23 0525  ALBUMIN 4.3 3.7   No results for input(s): LIPASE, AMYLASE in the last 168 hours. No results for input(s): AMMONIA in the last 168 hours.  CBC: Recent Labs  Lab 10/20/23 1300 10/22/23 0520  WBC 10.0 8.2  NEUTROABS 7.0  --   HGB 12.4* 12.6*  HCT 34.7* 34.5*  MCV 87.0 86.0  PLT 235 227    Cardiac Enzymes: No results for input(s): CKTOTAL, CKMB, CKMBINDEX, TROPONINI in the last 168 hours.  BNP: Invalid input(s): POCBNP  CBG: Recent Labs  Lab 10/24/23 1203 10/24/23 1636 10/24/23 2205 10/25/23 0430 10/25/23 0814   GLUCAP 166* 145* 130* 113* 112*    Microbiology: Results for orders placed or performed during the hospital encounter of 10/20/23  CSF culture w Gram Stain     Status: None (Preliminary result)   Collection Time: 10/22/23  1:36 PM   Specimen: PATH Cytology CSF; Cerebrospinal Fluid  Result Value Ref Range Status   Specimen Description   Final    CSF Performed at [facility], [address], [place], [place] [address]    Special Requests   Final    NONE Performed at [facility], [address] [place], [place] [address]    Gram Stain   Final    RED BLOOD CELLS PRESENT WBC SEEN NO ORGANISMS SEEN CYTOSPIN SMEAR    Culture   Final    NO GROWTH 2 DAYS Performed at [facility], [address] [place], [place] [address]    Report Status PENDING  Incomplete    Coagulation Studies: No results for input(s): LABPROT, INR in the last 72 hours.   Urinalysis: Recent Labs    10/22/23 2040  COLORURINE YELLOW*  LABSPEC 1.012  PHURINE 6.0  GLUCOSEU 50*  HGBUR NEGATIVE  BILIRUBINUR NEGATIVE  KETONESUR NEGATIVE  PROTEINUR >=300*  NITRITE NEGATIVE  LEUKOCYTESUR NEGATIVE      Imaging: No results found.    Medications:  aspirin   81 mg Oral Daily   atorvastatin   80 mg Oral QHS   clopidogrel   75 mg Oral Daily   gabapentin   100 mg Oral QHS   heparin   5,000 Units Subcutaneous Q8H   hydrALAZINE   100 mg Oral TID   hydrochlorothiazide   25 mg Oral Daily   insulin  aspart  0-15 Units Subcutaneous TID WC   pantoprazole   40 mg Oral Daily   polyethylene glycol  17 g Oral Daily   verapamil   80 mg Oral Q8H   acetaminophen  **OR** acetaminophen  (TYLENOL ) oral liquid 160 mg/5 mL **OR** acetaminophen , hydrALAZINE , LORazepam , mouth rinse, senna-docusate, traMADol   Assessment/ Plan:  [name] is a 63 y.o.  male with past medical conditions including diabetes, hypertension, GERD, osteoarthritis, polycystic kidney disease, who was  admitted to [facility] on 10/20/2023 for CVA [facility]ce[facility] vascular accident) [facility]) [I63.9] Cerebrovascular accident (CVA), unspecified mechanism (HCC) [I63.9]   Acute kidney injury with proteinuria on suspected chronic kidney disease stage IIIb.  Baseline appears to be creatinine 1.88 with GFR 39 on 08/14/2023.  Patient does not currently follow with nephrology.  Renal ultrasound shows mild right kidney atrophy, no obstruction.  No recent IV contrast exposure.  Creatinine 2.22 on ED arrival.   Creatinine appears to have stablized UA appears yellow with proteinuria.  Protein creatinine ratio 1.99. Will follow up with patient after discharge in 1-2 weeks  Lab Results  Component Value Date   CREATININE 2.40 (H) 10/25/2023   CREATININE 2.51 (H) 10/24/2023   CREATININE 2.32 (H) 10/22/2023    Intake/Output Summary (Last 24 hours) at 10/25/2023 1052 Last data filed at 10/25/2023 1043 Gross per 24 hour  Intake 120 ml  Output --  Net 120 ml   2. Anemia of chronic kidney disease Lab Results  Component Value Date   HGB 12.6 (L) 10/22/2023    Hemoglobin at goal. No indication for dialysis  3. Secondary Hyperparathyroidism: with outpatient labs: None available Lab Results  Component Value Date   PTH 61 10/23/2023   CALCIUM  9.0 10/25/2023   PHOS 3.8 10/24/2023    PTH 61.  Bone minerals acceptable   4. Diabetes mellitus type II with chronic kidney disease/renal manifestations: noninsulin dependent. Home regimen includes glipizide . Most recent hemoglobin A1c is 6.1 on 10/20/23.     We will sign off at this time and schedule follow up in office.    LOS: 5 Tommye Lehenbauer 7/24/202510:52 AM

## 2023-10-25 NOTE — Plan of Care (Signed)
  Problem: Education: Goal: Knowledge of disease or condition will improve Outcome: Progressing Goal: Knowledge of secondary prevention will improve (MUST DOCUMENT ALL) Outcome: Progressing Goal: Knowledge of patient specific risk factors will improve (DELETE if not current risk factor) Outcome: Progressing   Problem: Ischemic Stroke/TIA Tissue Perfusion: Goal: Complications of ischemic stroke/TIA will be minimized Outcome: Progressing   Problem: Coping: Goal: Will verbalize positive feelings about self Outcome: Progressing   Problem: Self-Care: Goal: Ability to participate in self-care as condition permits will improve Outcome: Progressing   Problem: Coping: Goal: Ability to adjust to condition or change in health will improve Outcome: Progressing   Problem: Skin Integrity: Goal: Risk for impaired skin integrity will decrease Outcome: Progressing   Problem: Education: Goal: Knowledge of General Education information will improve Description: Including pain rating scale, medication(s)/side effects and non-pharmacologic comfort measures Outcome: Progressing

## 2023-10-26 ENCOUNTER — Other Ambulatory Visit: Payer: Self-pay

## 2023-10-26 DIAGNOSIS — I639 Cerebral infarction, unspecified: Secondary | ICD-10-CM | POA: Diagnosis not present

## 2023-10-26 LAB — CSF CULTURE W GRAM STAIN: Culture: NO GROWTH

## 2023-10-26 LAB — CRYOGLOBULIN

## 2023-10-26 LAB — OLIGOCLONAL BANDS, CSF + SERM

## 2023-10-26 LAB — GLUCOSE, CAPILLARY
Glucose-Capillary: 124 mg/dL — ABNORMAL HIGH (ref 70–99)
Glucose-Capillary: 129 mg/dL — ABNORMAL HIGH (ref 70–99)
Glucose-Capillary: 200 mg/dL — ABNORMAL HIGH (ref 70–99)

## 2023-10-26 MED ORDER — VERAPAMIL HCL 80 MG PO TABS
80.0000 mg | ORAL_TABLET | Freq: Three times a day (TID) | ORAL | 2 refills | Status: DC
  Filled 2023-10-26: qty 90, 30d supply, fill #0
  Filled 2023-12-10: qty 90, 30d supply, fill #1

## 2023-10-26 MED ORDER — ACETAMINOPHEN 325 MG PO TABS: 650.0000 mg | ORAL_TABLET | ORAL | Status: AC | PRN

## 2023-10-26 MED ORDER — ATORVASTATIN CALCIUM 80 MG PO TABS
80.0000 mg | ORAL_TABLET | Freq: Every day | ORAL | 2 refills | Status: AC
  Filled 2023-10-26: qty 30, 30d supply, fill #0
  Filled 2023-12-10: qty 30, 30d supply, fill #1

## 2023-10-26 MED ORDER — HYDRALAZINE HCL 100 MG PO TABS
100.0000 mg | ORAL_TABLET | Freq: Three times a day (TID) | ORAL | 2 refills | Status: DC
  Filled 2023-10-26: qty 90, 30d supply, fill #0
  Filled 2023-12-10: qty 90, 30d supply, fill #1

## 2023-10-26 MED ORDER — ASPIRIN 81 MG PO CHEW
81.0000 mg | CHEWABLE_TABLET | Freq: Every day | ORAL | 2 refills | Status: AC
  Filled 2023-10-26: qty 30, 30d supply, fill #0
  Filled 2023-12-10: qty 30, 30d supply, fill #1
  Filled 2024-01-26: qty 30, 30d supply, fill #2

## 2023-10-26 MED ORDER — CLOPIDOGREL BISULFATE 75 MG PO TABS
75.0000 mg | ORAL_TABLET | Freq: Every day | ORAL | 2 refills | Status: DC
  Filled 2023-10-26: qty 30, 30d supply, fill #0
  Filled 2023-12-10: qty 30, 30d supply, fill #1

## 2023-10-26 NOTE — Plan of Care (Signed)
  Problem: Education: Goal: Knowledge of disease or condition will improve 10/26/2023 0018 by Gladis Manuelita PARAS, RN Outcome: Progressing 10/26/2023 0016 by Gladis Manuelita PARAS, RN Outcome: Progressing   Problem: Education: Goal: Knowledge of secondary prevention will improve (MUST DOCUMENT ALL) 10/26/2023 0018 by Gladis Manuelita PARAS, RN Outcome: Progressing 10/26/2023 0016 by Gladis Manuelita PARAS, RN Outcome: Progressing   Problem: Education: Goal: Knowledge of patient specific risk factors will improve (DELETE if not current risk factor) 10/26/2023 0018 by Gladis Manuelita PARAS, RN Outcome: Progressing 10/26/2023 0016 by Gladis Manuelita PARAS, RN Outcome: Progressing   Problem: Ischemic Stroke/TIA Tissue Perfusion: Goal: Complications of ischemic stroke/TIA will be minimized 10/26/2023 0018 by Gladis Manuelita PARAS, RN Outcome: Progressing 10/26/2023 0016 by Gladis Manuelita PARAS, RN Outcome: Progressing

## 2023-10-26 NOTE — Discharge Summary (Signed)
 Physician Discharge Summary   Patient: Richard Hunt MRN: [id number] DOB: 02/26/1961  Admit date:     10/20/2023  Discharge date: 10/26/2023  Discharge Physician: Burnard DELENA Cunning   PCP: [facility]   Recommendations at discharge:   Follow up with Primary Care in 1-2 weeks Follow up with Neurology Referral sent to Vascular Surgery for follow up on Right carotid stenosis Follow up on blood pressure management & titrate regimen.  Note Neurology recommendations regarding BP control -- may need to allow higher BP's if patient has recurrent stroke symptoms with tight BP control.  Discharge Diagnoses: Principal Problem:   CVA (cerebral vascular accident) (HCC) Active Problems:   Diabetes (HCC)   HTN (hypertension)   HLD (hyperlipidemia)   GERD (gastroesophageal reflux disease)  Resolved Problems:   * No resolved hospital problems. *  Hospital Course:  Richard Hunt is a 63 y.o. male with medical history significant hypertension, hyperlipidemia, diabetes mellitus, CKD stage IIIb with proteinuria, chronic venous insufficiency, who presented to the hospital because of headache and intermittent dizziness for over 2 months.  He is a known hypertensive urgency and has been medically adherent with medications.  Blood pressure has still been uncontrolled.  Family reported that sometimes his speech has been difficult to understand.   In the ED, BP was 197/140.   MRI brain showed small acute/subacute infarct scattered within the callosal splenium on the left and within the bilateral parieto-occipital lobes.   Further hospital course and management as outlined below.   7/25 -- pt reports doing well, feels better today.  No headache.  Ambulating well.  Denies recurrent vision symptoms or left sided numbness.  Pt is medically stable and agreeable to d/c.  Patient's son visited prior to d/c. I returned to bedside to answer son's and patient's questions related to BP  medications and follow up recommendations.  Assessment and Plan:  Acute ischemic stroke, suspect embolic based on distribution:  Neurologist consulted - appreciate recommendations 2D echo showed EF estimated at 60 to 65%, moderate concentric LVH, grade 1 diastolic dysfunction and no evidence of interatrial shunt. Lower extremity pain: Pain is better.  Venous duplex did not show any evidence of DVT. MRA head and neck showed severe narrowing at different levels of of right ICA, right MCA, right PCA and occlusion of right vertebral artery just proximal to the vertebrobasilar confluence, and severe near complete stenosis of the distal left vertebral artery.. 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   Intermittent diplopia - with ambulating, improves with rest Right ICA Stenosis --Continue verapamil  --Outpatient vascular surgery referral to consider Right ICA intervention if further left sided symptoms develop - referral placed     Hypertensive urgency: BP improved but remains elevated.   --Continue verapamil , hydralazine  and hydrochlorothiazide . --7/23 -- Increase hydralazine  75 >> 100 mg TID  -- 7/24 -- Increase verapamil  40 >> 80 mg TID --Monitor BP closely & continue to titrate regimen Per Neurology: ...if he becomes symptomatic with lower blood pressure may need a higher blood pressure goal  Current BP regimen: hydralazine  100 mg TID, hydrochlorothiazide  25 mg daily, verapimil 80 mg  TID. --Titrate BP regimen at outpatient follow up --Advised home BP monitoring 2-3 times daily until follow up     Type II DM with hyperglycemia:  Well controlled with Hemoglobin A1c 6.1 % --Managed with sliding scale NovoLog  --Resume glimepiride at d/c --PCP follow up     Probable AKI on CKD stage IIIb, proteinuria:  Suspect baseline CKD IIIb --Nephrology consulted   Normal C3-C4 levels no M spike on protein electrophoresis. ANA, Sjogren syndrome AMB antibodies, IgG, IgA and IgM antibodies were all normal. ANCA titers, cryoglobulin, oligoclonal CSF bands are pending. --Monitor renal function   Chart review showed that baseline creatinine between 1.8-2.4. Urine albumin creatinine ratio was 1,974.1 on 08/17/2023.     Hyperlipidemia: Controlled.  LDL 74, HDL 41, triglycerides 113 and total cholesterol 138.  --Continue Lipitor        Consultants: Neurology, Nephrology Procedures performed: None  Disposition: Home Diet recommendation:  Cardiac and Carb modified diet DISCHARGE MEDICATION: Allergies as of 10/26/2023   No Known Allergies      Medication List     STOP taking these medications    amLODipine  10 MG tablet Commonly known as: NORVASC    amLODipine  5 MG tablet Commonly known as: NORVASC    meloxicam 15 MG tablet Commonly known as: MOBIC   ondansetron  4 MG disintegrating tablet Commonly known as: ZOFRAN -ODT       TAKE these medications    acetaminophen  325 MG tablet Commonly known as: TYLENOL  Take 2 tablets (650 mg total) by mouth every 4 (four) hours as needed for mild pain (pain score 1-3) or fever (or temp > 37.5 C (99.5 F)).   Aspirin  Low Dose 81 MG chewable tablet Generic drug: aspirin  Masticar 1 tableta (81 mg en total) por va oral diariamente. (Chew 1 tablet (81 mg total) by mouth daily.)   atorvastatin  80 MG tablet Commonly known as: LIPITOR  Tome 1 tableta (80 mg en total) por va oral antes de acostarse. (Take 1 tablet (80 mg total) by  mouth at bedtime.) What changed:  medication strength how much to take when to take this   clopidogrel  75 MG tablet Commonly known as: PLAVIX  Tome 1 tableta (75 mg en total) por va oral diariamente. (Take 1 tablet (75 mg total) by mouth daily.)   gabapentin  100 MG capsule Commonly known as: NEURONTIN  Take 100 mg by mouth at bedtime.   glipiZIDE  5 MG tablet Commonly known as: GLUCOTROL  Take 1 tablet (5 mg total) by mouth 2 (two) times daily before a meal.   hydrALAZINE  100 MG tablet Commonly known as: APRESOLINE  Tomar 1 tableta (100 mg en total) por va oral 3 (tres) veces al [employer]. (Take 1 tablet (100 mg total) by mouth 3 (three) times daily.) What changed:  medication strength how much to take   hydrochlorothiazide  25 MG tablet Commonly known as: HYDRODIURIL  Take 25 mg by mouth daily.   omeprazole 40 MG capsule Commonly known as: PRILOSEC Take 40 mg by mouth.   verapamil  80 MG tablet Commonly known as: CALAN  Tomar 1 tableta (80 mg en total) por va oral cada 8 (ocho) horas. (Take 1 tablet (80 mg total) by mouth every 8 (eight) hours.)        Follow-up Information     [facility]. Go on 11/02/2023.   Why: @10 :00am Contact information: [address] [place] [address] [phone]  Discharge Exam: Filed Weights   10/20/23 1146  Weight: 97.1 kg   General exam: awake, alert, no acute distress HEENT: atraumatic, clear conjunctiva, anicteric sclera, moist mucus membranes, hearing grossly normal  Respiratory system: CTAB, no wheezes, rales or rhonchi, normal respiratory effort. Cardiovascular system: normal S1/S2, RRR Gastrointestinal system: soft, NT, ND Central nervous system: A&O x 3. normal speech, grossly non-focal Extremities: moves all, no edema, normal tone Psychiatry: normal mood, congruent affect, judgement and insight appear normal   Condition at discharge: stable  The results of significant diagnostics  from this hospitalization (including imaging, microbiology, ancillary and laboratory) are listed below for reference.   Imaging Studies: DG FL GUIDED LUMBAR PUNCTURE Result Date: 10/22/2023 CLINICAL DATA:  63 year old male who presented to the ED with headaches, blurry vision, and unsteadiness on his feet. Reports increasing frequency and severity of headaches over the past few months. Imaging significant for small-age indeterminate infarct in the right cerebellar hemisphere and few nonspecific chronic microhemorrhages. Request for lumbar puncture to rule out additional underlying processes. EXAM: LUMBAR PUNCTURE UNDER FLUOROSCOPY PROCEDURE: An appropriate skin entry site was determined fluoroscopically. Operator donned sterile gloves and mask. Skin site was marked, then prepped with Betadine, draped in usual sterile fashion, and infiltrated locally with 1% lidocaine . A 5-inch 22 gauge spinal needle advanced into the thecal sac at L3-4 from a left interlaminar approach. Clear colorless CSF spontaneously returned, with opening pressure of 12 cm water. 7 ml CSF were collected and divided among 4 sterile vials for the requested laboratory studies. The needle was then removed. The patient tolerated the procedure well and there were no complications. FLUOROSCOPY: Radiation Exposure Index (as provided by the fluoroscopic device): 44.5 mGy Kerma IMPRESSION: Technically successful lumbar puncture under fluoroscopy. This exam was performed by [facility] PA-C, and was supervised and interpreted by Dr. ONEIDA Specking. Electronically Signed   By: CHRISTELLA.  Shick M.D.   On: 10/22/2023 13:55   US  RENAL Result Date: 10/22/2023 CLINICAL DATA:  Acute renal failure EXAM: RENAL / URINARY TRACT ULTRASOUND COMPLETE COMPARISON:  12/10/2016 CT FINDINGS: Right Kidney: Renal measurements: 11.1 x 5.5 x 5.3 cm = volume: 168 mL. Echogenicity within normal limits. No mass or hydronephrosis visualized. Left Kidney: Renal measurements: 9.5 x 4.2  x 3.8 cm = volume: 80 mL. Trace left perinephric edema/fluid, nonspecific. Normal echogenicity. No hydronephrosis. Renal cysts of up to 1.7 cm. Bladder: Decompressed Other: None. IMPRESSION: Mild left renal atrophy. No hydronephrosis or other explanation for renal failure. Electronically Signed   By: Rockey Kilts M.D.   On: 10/22/2023 10:54   US  Venous Img Lower Bilateral (DVT) Result Date: 10/21/2023 CLINICAL DATA:  Leg pain.  Stroke EXAM: BILATERAL LOWER EXTREMITY VENOUS DOPPLER ULTRASOUND TECHNIQUE: Gray-scale sonography with graded compression, as well as color Doppler and duplex ultrasound were performed to evaluate the lower extremity deep venous systems from the level of the common femoral vein and including the common femoral, femoral, profunda femoral, popliteal and calf veins including the posterior tibial, peroneal and gastrocnemius veins when visible. The superficial great saphenous vein was also interrogated. Spectral Doppler was utilized to evaluate flow at rest and with distal augmentation maneuvers in the common femoral, femoral and popliteal veins. COMPARISON:  Left lower extremity DVT ultrasound 12/20/2016 FINDINGS: RIGHT LOWER EXTREMITY Common Femoral Vein: No evidence of thrombus. Normal compressibility, respiratory phasicity and response to augmentation. Saphenofemoral Junction: No evidence of thrombus. Normal compressibility and flow on color Doppler imaging. Profunda Femoral Vein: No evidence of thrombus. Normal compressibility and  flow on color Doppler imaging. Femoral Vein: No evidence of thrombus. Normal compressibility, respiratory phasicity and response to augmentation. Popliteal Vein: No evidence of thrombus. Normal compressibility, respiratory phasicity and response to augmentation. Calf Veins: No evidence of thrombus. Normal compressibility and flow on color Doppler imaging. Superficial Great Saphenous Vein: No evidence of thrombus. Normal compressibility. Venous Reflux:  None.  Other Findings: Laterally there is a oblong fluid collection measuring 3.0 x 0.6 cm at the level of the knee. LEFT LOWER EXTREMITY Common Femoral Vein: No evidence of thrombus. Normal compressibility, respiratory phasicity and response to augmentation. Saphenofemoral Junction: No evidence of thrombus. Normal compressibility and flow on color Doppler imaging. Profunda Femoral Vein: No evidence of thrombus. Normal compressibility and flow on color Doppler imaging. Femoral Vein: No evidence of thrombus. Normal compressibility, respiratory phasicity and response to augmentation. Popliteal Vein: No evidence of thrombus. Normal compressibility, respiratory phasicity and response to augmentation. Calf Veins: No evidence of thrombus. Normal compressibility and flow on color Doppler imaging. Superficial Great Saphenous Vein: No evidence of thrombus. Normal compressibility. Venous Reflux:  None. Other Findings:  None. IMPRESSION: No evidence of deep venous thrombosis in either lower extremity. Oblong fluid collection seen lateral to the right knee. Please correlate for any known history or dedicated workup when appropriate. Please correlate for any prior history or of infection or trauma. Electronically Signed   By: Ranell Bring M.D.   On: 10/21/2023 15:19   ECHOCARDIOGRAM COMPLETE Result Date: 10/21/2023    ECHOCARDIOGRAM REPORT   Patient Name:   LAVAN IMES Date of Exam: 10/21/2023 Medical Rec #:  [id number]               Height:       65.0 in Accession #:    [id number]              Weight:       214.1 lb Date of Birth:  11/12/1960               BSA:          2.036 m Patient Age:    63 years                BP:           177/94 mmHg Patient Gender: M                       HR:           79 bpm. Exam Location:  [facility] Procedure: 2D Echo, Cardiac Doppler, Color Doppler, Strain Analysis and Saline            Contrast Bubble Study (Both Spectral and Color Flow Doppler were            utilized during procedure).  Indications:     Stroke I63.9  History:         Patient has no prior history of Echocardiogram examinations.  Sonographer:     Thedora Louder RDCS, FASE Referring Phys:  [id number] BRIGIDA BUREAU Diagnosing Phys: Cara JONETTA Lovelace MD  Sonographer Comments: Global longitudinal strain was attempted. IMPRESSIONS  1. Left ventricular ejection fraction, by estimation, is 60 to 65%. The left ventricle has normal function. The left ventricle has no regional wall motion abnormalities. The left ventricular internal cavity size was mildly dilated. There is moderate concentric left ventricular hypertrophy. Left ventricular diastolic parameters are consistent with Grade I diastolic dysfunction (impaired relaxation). The average left ventricular global longitudinal strain  is 16.5 %. The global longitudinal strain is normal.  2. Right ventricular systolic function is normal. The right ventricular size is normal.  3. Left atrial size was mildly dilated.  4. The mitral valve is normal in structure. Mild mitral valve regurgitation.  5. The aortic valve is normal in structure. Aortic valve regurgitation is not visualized. Aortic valve sclerosis/calcification is present, without any evidence of aortic stenosis. FINDINGS  Left Ventricle: Left ventricular ejection fraction, by estimation, is 60 to 65%. The left ventricle has normal function. The left ventricle has no regional wall motion abnormalities. The average left ventricular global longitudinal strain is 16.5 %. Strain was performed and the global longitudinal strain is normal. The left ventricular internal cavity size was mildly dilated. There is moderate concentric left ventricular hypertrophy. Left ventricular diastolic parameters are consistent with Grade I diastolic dysfunction (impaired relaxation). Right Ventricle: The right ventricular size is normal. No increase in right ventricular wall thickness. Right ventricular systolic function is normal. Left Atrium: Left atrial size was  mildly dilated. Right Atrium: Right atrial size was normal in size. Pericardium: There is no evidence of pericardial effusion. Mitral Valve: The mitral valve is normal in structure. Mild mitral valve regurgitation. Tricuspid Valve: The tricuspid valve is normal in structure. Tricuspid valve regurgitation is mild. Aortic Valve: The aortic valve is normal in structure. Aortic valve regurgitation is not visualized. Aortic valve sclerosis/calcification is present, without any evidence of aortic stenosis. Aortic valve peak gradient measures 10.9 mmHg. Pulmonic Valve: The pulmonic valve was grossly normal. Pulmonic valve regurgitation is not visualized. Aorta: The ascending aorta was not well visualized. IAS/Shunts: No atrial level shunt detected by color flow Doppler. Agitated saline contrast was given intravenously to evaluate for intracardiac shunting. Additional Comments: 3D was performed not requiring image post processing on an independent workstation and was indeterminate.  LEFT VENTRICLE PLAX 2D LVIDd:         5.20 cm   Diastology LVIDs:         3.50 cm   LV e' medial:    5.55 cm/s LV PW:         1.60 cm   LV E/e' medial:  14.8 LV IVS:        1.50 cm   LV e' lateral:   6.42 cm/s LVOT diam:     2.10 cm   LV E/e' lateral: 12.8 LV SV:         73 LV SV Index:   36        2D Longitudinal Strain LVOT Area:     3.46 cm  2D Strain GLS (A4C):   13.8 %                          2D Strain GLS (A3C):   20.2 %                          2D Strain GLS (A2C):   15.4 %                          2D Strain GLS Avg:     16.5 % RIGHT VENTRICLE RV Basal diam:  3.10 cm RV S prime:     15.90 cm/s TAPSE (M-mode): 2.0 cm LEFT ATRIUM             Index        RIGHT ATRIUM  Index LA diam:        4.30 cm 2.11 cm/m   RA Area:     11.50 cm LA Vol (A2C):   24.4 ml 11.98 ml/m  RA Volume:   28.00 ml  13.75 ml/m LA Vol (A4C):   37.9 ml 18.61 ml/m LA Biplane Vol: 32.7 ml 16.06 ml/m  AORTIC VALVE                 PULMONIC VALVE AV Area  (Vmax): 2.33 cm     PV Vmax:        1.22 m/s AV Vmax:        165.00 cm/s  PV Peak grad:   6.0 mmHg AV Peak Grad:   10.9 mmHg    RVOT Peak grad: 5 mmHg LVOT Vmax:      111.00 cm/s LVOT Vmean:     68.700 cm/s LVOT VTI:       0.210 m  AORTA Ao Root diam: 3.90 cm Ao Asc diam:  3.30 cm MITRAL VALVE MV Area (PHT): 3.89 cm     SHUNTS MV Decel Time: 195 msec     Systemic VTI:  0.21 m MV E velocity: 82.30 cm/s   Systemic Diam: 2.10 cm MV A velocity: 104.00 cm/s MV E/A ratio:  0.79 Dwayne D Callwood MD Electronically signed by Cara JONETTA Lovelace MD Signature Date/Time: 10/21/2023/2:17:33 PM    Final    MR ANGIO HEAD WO CONTRAST Result Date: 10/20/2023 CLINICAL DATA:  Stroke/TIA EXAM: MRA NECK WITHOUT AND WITH CONTRAST MRA HEAD WITHOUT CONTRAST TECHNIQUE: Multiplanar and multiecho pulse sequences of the neck were obtained without and with intravenous contrast. Angiographic images of the neck were obtained using MRA technique without and with intravenous contrast.; Angiographic images of the Circle of Willis were obtained using MRA technique without intravenous contrast. CONTRAST:  10mL GADAVIST  GADOBUTROL  1 MMOL/ML IV SOLN COMPARISON:  None Available. FINDINGS: MRA NECK FINDINGS Aortic arch: There is a normal variant aortic arch branching pattern with the brachiocephalic and left common carotid arteries sharing a common origin. Right carotid system: The right ICA is severely narrowed at its origin (with focal poststenotic dilatation of the proximal ICA. The distal ICA is somewhat irregular with undulation and multifocal stenosis. The right ICA terminus appears occluded past the ophthalmic segment. Left carotid system: Unremarkable Vertebral arteries: Patent with antegrade flow. Other: None. MRA HEAD FINDINGS POSTERIOR CIRCULATION: The right vertebral artery is occluded just proximal to the vertebrobasilar confluence. There is severe, near complete stenosis of the distal left vertebral artery. Basilar artery is patent and  normal caliber. Superior cerebellar arteries are normal. Severe stenosis of the right PCA P2 segment. Narrowing of the distal P3 segment of the left PCA. ANTERIOR CIRCULATION: Poor flow related enhancement of the right internal carotid artery. The petrous segment is markedly narrowed and the lacerum and proximal cavernous segments are likely occluded. The terminus also appears occluded. Unremarkable left ICA. Anterior cerebral arteries are normal. 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 Normal left MCA. Anatomic Variants: None IMPRESSION: 1. 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 2. The right ICA terminus appears occluded past the ophthalmic segment. 3. 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 4. Severe stenosis of the right PCA P2 segment. 5. 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 6. Severe, near complete stenosis of the distal left vertebral artery. Electronically Signed  By: Franky Stanford M.D.   On: 10/20/2023 23:47   MR ANGIO NECK W WO CONTRAST Result Date: 10/20/2023 CLINICAL DATA:  Stroke/TIA EXAM: MRA NECK WITHOUT AND WITH CONTRAST MRA HEAD WITHOUT CONTRAST TECHNIQUE: Multiplanar and multiecho pulse sequences of the neck were obtained without and with intravenous contrast. Angiographic images of the neck were obtained using MRA technique without and with intravenous contrast.; Angiographic images of the Circle of Willis were obtained using MRA technique without intravenous contrast. CONTRAST:  10mL GADAVIST  GADOBUTROL  1 MMOL/ML IV SOLN COMPARISON:  None Available. FINDINGS: MRA NECK FINDINGS Aortic arch: There is a normal variant aortic arch branching pattern with the brachiocephalic and left common carotid arteries sharing a common origin. Right carotid system: The right ICA is severely narrowed at its origin (with focal poststenotic  dilatation of the proximal ICA. The distal ICA is somewhat irregular with undulation and multifocal stenosis. The right ICA terminus appears occluded past the ophthalmic segment. Left carotid system: Unremarkable Vertebral arteries: Patent with antegrade flow. Other: None. MRA HEAD FINDINGS POSTERIOR CIRCULATION: The right vertebral artery is occluded just proximal to the vertebrobasilar confluence. There is severe, near complete stenosis of the distal left vertebral artery. Basilar artery is patent and normal caliber. Superior cerebellar arteries are normal. Severe stenosis of the right PCA P2 segment. Narrowing of the distal P3 segment of the left PCA. ANTERIOR CIRCULATION: Poor flow related enhancement of the right internal carotid artery. The petrous segment is markedly narrowed and the lacerum and proximal cavernous segments are likely occluded. The terminus also appears occluded. Unremarkable left ICA. Anterior cerebral arteries are normal. 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 Normal left MCA. Anatomic Variants: None IMPRESSION: 1. 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 2. The right ICA terminus appears occluded past the ophthalmic segment. 3. 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 4. Severe stenosis of the right PCA P2 segment. 5. 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 6. Severe, near complete stenosis of the distal left vertebral artery. Electronically Signed   By: Franky Stanford M.D.   On: 10/20/2023 23:47   MR BRAIN WO CONTRAST Result Date: 10/20/2023 CLINICAL DATA:  Provided history: Neuro deficit, acute, stroke suspected. EXAM: MRI HEAD WITHOUT CONTRAST TECHNIQUE: Multiplanar, multiecho pulse sequences of the brain and surrounding structures were obtained without intravenous contrast. COMPARISON:  Head CT 10/20/2023. FINDINGS:  Brain: No age-advanced or lobar predominant cerebral atrophy. Patchy small acute/subacute infarcts scattered within the callosal splenium on the left and within the bilateral parietooccipital lobes. Chronic lacunar infarcts within/about the bilateral deep gray nuclei. Moderate multifocal T2 FLAIR hyperintense signal abnormality elsewhere the cerebral white matter, nonspecific but compatible chronic small vessel ischemic disease. Small chronic lacunar infarct within the right middle cerebellar peduncle. Few nonspecific chronic microhemorrhages scattered within the supratentorial brain. No evidence of an intracranial mass. No extra-axial fluid collection. No midline shift. Vascular: Asymmetrically diminutive right internal carotid artery at the imaged levels suspicious for the presence of a nonvisualized upstream (more proximal) stenosis within the neck. Flow voids preserved elsewhere within the proximal large arterial vessels. Skull and upper cervical spine: No focal worrisome marrow lesion. Sinuses/Orbits: No mass or acute finding within the imaged orbits. Minimal mucosal thickening within the bilateral maxillary, sphenoid and ethmoid sinuses. Other: Trace fluid within left mastoid air cells. IMPRESSION: 1. Patchy small acute/subacute infarcts scattered within the callosal splenium  on the left, and within the bilateral parietooccipital lobes. 2. Background age-advanced chronic small vessel ischemic disease with multiple chronic lacunar infarcts, as described. 3. Few nonspecific chronic microhemorrhages within the supratentorial brain. 4. Asymmetrically diminutive right internal carotid artery at the imaged levels suspicious for the presence of a non-visualized upstream (more proximal) stenosis within the neck. Consider CT or MR angiography further evaluation. 5. Minor paranasal sinus mucosal thickening. 6. Trace fluid within left mastoid air cells. Electronically Signed   By: Rockey Childs D.O.   On: 10/20/2023 15:33    CT Head Wo Contrast Result Date: 10/20/2023 CLINICAL DATA:  Provided history: Hypertension.  Headache. EXAM: CT HEAD WITHOUT CONTRAST TECHNIQUE: Contiguous axial images were obtained from the base of the skull through the vertex without intravenous contrast. RADIATION DOSE REDUCTION: This exam was performed according to the departmental dose-optimization program which includes automated exposure control, adjustment of the mA and/or kV according to patient size and/or use of iterative reconstruction technique. COMPARISON:  None. FINDINGS: Brain: No age-advanced or lobar predominant cerebral atrophy. Small age-indeterminate infarct within the right cerebellar hemisphere (series 3, image 11) (series 4, image 49). Patchy and ill-defined hypoattenuation within the cerebral white matter, nonspecific but compatible with moderate chronic small vessel ischemic disease. There is no acute intracranial hemorrhage. No demarcated cortical infarct. No extra-axial fluid collection. No evidence of an intracranial mass. No midline shift. Vascular: No hyperdense vessel.  Atherosclerotic calcifications. Skull: No calvarial fracture or aggressive osseous lesion. Sinuses/Orbits: No mass or acute finding within the imaged orbits. Mild mucosal thickening within the bilateral maxillary, right sphenoid and bilateral ethmoid sinuses. Other: Left mastoid effusion. Bilateral mastoid air cell sclerosis. IMPRESSION: 1. Small age-indeterminate infarct within the right cerebellar hemisphere. Consider a brain MRI for further evaluation. 2. Moderate chronic small vessel ischemic changes within the cerebral white matter. 3. Mild paranasal sinus mucosal thickening. 4. Left mastoid effusion (with bilateral mastoid air cell sclerosis). Electronically Signed   By: Rockey Childs D.O.   On: 10/20/2023 12:17    Microbiology: Results for orders placed or performed during the hospital encounter of 10/20/23  CSF culture w Gram Stain     Status: None    Collection Time: 10/22/23  1:36 PM   Specimen: PATH Cytology CSF; Cerebrospinal Fluid  Result Value Ref Range Status   Specimen Description   Final    CSF Performed at [facility], [address], [place], [place] [address]    Special Requests   Final    NONE Performed at [facility], [address], [place], [place] [address]    Gram Stain   Final    RED BLOOD CELLS PRESENT WBC SEEN NO ORGANISMS SEEN CYTOSPIN SMEAR    Culture   Final    NO GROWTH 3 DAYS Performed at [facility], [address], [place], [place] [address]    Report Status 10/26/2023 FINAL  Final    Labs: CBC: No results for input(s): WBC, NEUTROABS, HGB, HCT, MCV, PLT in the last 168 hours.  Basic Metabolic Panel: Recent Labs  Lab 10/24/23 0525 10/25/23 0525  NA 132* 134*  K 3.9 3.8  CL 97* 98  CO2 23 24  GLUCOSE 131* 112*  BUN 31* 32*  CREATININE 2.51* 2.40*  CALCIUM  9.2 9.0  PHOS 3.8  --    Liver Function Tests: Recent Labs  Lab 10/24/23 0525  ALBUMIN 3.7   CBG: Recent Labs  Lab 10/25/23 1624 10/25/23 2117 10/26/23 0052 10/26/23 0808 10/26/23  1103  GLUCAP 118* 142* 124* 129* 200*    Discharge time spent: greater than 30 minutes.  Signed: Burnard DELENA Cunning, DO [facility] 10/29/2023

## 2023-10-26 NOTE — Plan of Care (Signed)
  Problem: Education: Goal: Knowledge of disease or condition will improve Outcome: Progressing   Problem: Ischemic Stroke/TIA Tissue Perfusion: Goal: Complications of ischemic stroke/TIA will be minimized Outcome: Progressing   Problem: Health Behavior/Discharge Planning: Goal: Ability to manage health-related needs will improve Outcome: Progressing   Problem: Clinical Measurements: Goal: Ability to maintain clinical measurements within normal limits will improve Outcome: Progressing

## 2023-11-24 LAB — GLUCOSE, POCT (MANUAL RESULT ENTRY): POC Glucose: 97 mg/dL (ref 70–99)

## 2023-11-26 NOTE — Congregational Nurse Program (Signed)
  Dept: [phone]   Congregational Nurse Program Note  Date of Encounter: 11/24/2023  Patient encouraged to follow up with PCP/Provider about elevated BP.  Patient given know your numbers handout (spanish version) as well as knock out BP (spanish version). Past Medical History: Past Medical History:  Diagnosis Date   Acute cholecystitis 12/11/2016   Acute kidney injury (nontraumatic) (HCC)    Bilateral edema of lower extremity 01/02/2014   Chronic venous insufficiency 01/16/2014   Diabetes (HCC) 12/11/2016   Diabetes mellitus without complication (HCC)    GERD (gastroesophageal reflux disease)    HLD (hyperlipidemia)    HTN (hypertension) 12/11/2016   Hypercholesterolemia    Hypertension    Osteoarthritis    Polycystic kidney disease    Ulcer of calf (HCC) 08/29/2013    Encounter Details:  Community Questionnaire - 11/26/23 1949       Questionnaire   Ask client: Do you give verbal consent for me to treat you today? Yes    Student Assistance N/A    Location Patient Served  [facility]    Encounter Setting Other   [place] [facility] event   Population Status Unknown    Insurance Unknown    Insurance/Financial Assistance Referral N/A    Medication N/A    Medical Provider Yes    Screening Referrals Made N/A    Medical Referrals Made N/A    Medical Appointment Completed N/A    CNP Interventions Advocate/Support    Screenings CN Performed Blood Pressure;Blood Glucose    ED Visit Averted N/A    Life-Saving Intervention Made N/A          Today's Vitals   11/24/23 1100  BP: (!) 176/86  Pulse: 78  SpO2: 99%   There is no height or weight on file to calculate BMI.

## 2023-12-10 ENCOUNTER — Other Ambulatory Visit: Payer: Self-pay

## 2023-12-26 ENCOUNTER — Inpatient Hospital Stay (HOSPITAL_COMMUNITY)
Admission: EM | Admit: 2023-12-26 | Discharge: 2023-12-29 | DRG: 065 | Disposition: A | Discharge status: Home / Self Care | Attending: Family Medicine | Admitting: Family Medicine

## 2023-12-26 ENCOUNTER — Encounter (HOSPITAL_COMMUNITY): Payer: Self-pay

## 2023-12-26 ENCOUNTER — Emergency Department (HOSPITAL_COMMUNITY)

## 2023-12-26 ENCOUNTER — Other Ambulatory Visit: Payer: Self-pay

## 2023-12-26 DIAGNOSIS — I6503 Occlusion and stenosis of bilateral vertebral arteries: Secondary | ICD-10-CM | POA: Diagnosis present

## 2023-12-26 DIAGNOSIS — Z7982 Long term (current) use of aspirin: Secondary | ICD-10-CM

## 2023-12-26 DIAGNOSIS — Z743 Need for continuous supervision: Secondary | ICD-10-CM | POA: Diagnosis not present

## 2023-12-26 DIAGNOSIS — I679 Cerebrovascular disease, unspecified: Secondary | ICD-10-CM | POA: Diagnosis not present

## 2023-12-26 DIAGNOSIS — Z8673 Personal history of transient ischemic attack (TIA), and cerebral infarction without residual deficits: Secondary | ICD-10-CM | POA: Diagnosis not present

## 2023-12-26 DIAGNOSIS — R29702 NIHSS score 2: Secondary | ICD-10-CM | POA: Diagnosis present

## 2023-12-26 DIAGNOSIS — I161 Hypertensive emergency: Secondary | ICD-10-CM | POA: Diagnosis present

## 2023-12-26 DIAGNOSIS — I6621 Occlusion and stenosis of right posterior cerebral artery: Secondary | ICD-10-CM | POA: Diagnosis present

## 2023-12-26 DIAGNOSIS — G510 Bell's palsy: Secondary | ICD-10-CM | POA: Diagnosis present

## 2023-12-26 DIAGNOSIS — I7090 Unspecified atherosclerosis: Secondary | ICD-10-CM | POA: Diagnosis not present

## 2023-12-26 DIAGNOSIS — Z7984 Long term (current) use of oral hypoglycemic drugs: Secondary | ICD-10-CM

## 2023-12-26 DIAGNOSIS — I639 Cerebral infarction, unspecified: Secondary | ICD-10-CM | POA: Diagnosis present

## 2023-12-26 DIAGNOSIS — E1122 Type 2 diabetes mellitus with diabetic chronic kidney disease: Secondary | ICD-10-CM | POA: Diagnosis present

## 2023-12-26 DIAGNOSIS — I63231 Cerebral infarction due to unspecified occlusion or stenosis of right carotid arteries: Secondary | ICD-10-CM | POA: Diagnosis present

## 2023-12-26 DIAGNOSIS — R29703 NIHSS score 3: Secondary | ICD-10-CM

## 2023-12-26 DIAGNOSIS — R471 Dysarthria and anarthria: Secondary | ICD-10-CM | POA: Diagnosis present

## 2023-12-26 DIAGNOSIS — K219 Gastro-esophageal reflux disease without esophagitis: Secondary | ICD-10-CM | POA: Diagnosis present

## 2023-12-26 DIAGNOSIS — Z833 Family history of diabetes mellitus: Secondary | ICD-10-CM

## 2023-12-26 DIAGNOSIS — I129 Hypertensive chronic kidney disease with stage 1 through stage 4 chronic kidney disease, or unspecified chronic kidney disease: Secondary | ICD-10-CM | POA: Diagnosis present

## 2023-12-26 DIAGNOSIS — I69992 Facial weakness following unspecified cerebrovascular disease: Secondary | ICD-10-CM | POA: Diagnosis not present

## 2023-12-26 DIAGNOSIS — I872 Venous insufficiency (chronic) (peripheral): Secondary | ICD-10-CM | POA: Diagnosis present

## 2023-12-26 DIAGNOSIS — E1140 Type 2 diabetes mellitus with diabetic neuropathy, unspecified: Secondary | ICD-10-CM | POA: Diagnosis present

## 2023-12-26 DIAGNOSIS — E7800 Pure hypercholesterolemia, unspecified: Secondary | ICD-10-CM | POA: Diagnosis present

## 2023-12-26 DIAGNOSIS — I6350 Cerebral infarction due to unspecified occlusion or stenosis of unspecified cerebral artery: Secondary | ICD-10-CM | POA: Diagnosis not present

## 2023-12-26 DIAGNOSIS — Z79899 Other long term (current) drug therapy: Secondary | ICD-10-CM | POA: Diagnosis not present

## 2023-12-26 DIAGNOSIS — Z7902 Long term (current) use of antithrombotics/antiplatelets: Secondary | ICD-10-CM | POA: Diagnosis not present

## 2023-12-26 DIAGNOSIS — R29701 NIHSS score 1: Secondary | ICD-10-CM | POA: Diagnosis not present

## 2023-12-26 DIAGNOSIS — I6389 Other cerebral infarction: Secondary | ICD-10-CM | POA: Diagnosis not present

## 2023-12-26 DIAGNOSIS — I10 Essential (primary) hypertension: Secondary | ICD-10-CM | POA: Diagnosis not present

## 2023-12-26 DIAGNOSIS — I672 Cerebral atherosclerosis: Secondary | ICD-10-CM | POA: Diagnosis present

## 2023-12-26 DIAGNOSIS — R1310 Dysphagia, unspecified: Secondary | ICD-10-CM | POA: Diagnosis present

## 2023-12-26 DIAGNOSIS — N1832 Chronic kidney disease, stage 3b: Secondary | ICD-10-CM | POA: Diagnosis present

## 2023-12-26 DIAGNOSIS — G589 Mononeuropathy, unspecified: Secondary | ICD-10-CM | POA: Diagnosis not present

## 2023-12-26 LAB — CBC
HCT: 40.3 % (ref 39.0–52.0)
HCT: 34.2 % — ABNORMAL LOW (ref 39.0–52.0)
Hemoglobin: 13.7 g/dL (ref 13.0–17.0)
Hemoglobin: 11.9 g/dL — ABNORMAL LOW (ref 13.0–17.0)
MCH: 31.2 pg (ref 26.0–34.0)
MCH: 31.2 pg (ref 26.0–34.0)
MCHC: 34 g/dL (ref 30.0–36.0)
MCHC: 34.8 g/dL (ref 30.0–36.0)
MCV: 91.8 fL (ref 80.0–100.0)
MCV: 89.8 fL (ref 80.0–100.0)
Platelets: 248 K/uL (ref 150–400)
Platelets: 223 K/uL (ref 150–400)
RBC: 4.39 MIL/uL (ref 4.22–5.81)
RBC: 3.81 MIL/uL — ABNORMAL LOW (ref 4.22–5.81)
RDW: 12.8 % (ref 11.5–15.5)
RDW: 12.7 % (ref 11.5–15.5)
WBC: 9.4 K/uL (ref 4.0–10.5)
WBC: 8.3 K/uL (ref 4.0–10.5)
nRBC: 0 % (ref 0.0–0.2)
nRBC: 0 % (ref 0.0–0.2)

## 2023-12-26 LAB — URINE DRUG SCREEN
Amphetamines: NEGATIVE
Barbiturates: NEGATIVE
Benzodiazepines: NEGATIVE
Cocaine: NEGATIVE
Fentanyl: NEGATIVE
Methadone Scn, Ur: NEGATIVE
Opiates: NEGATIVE
Tetrahydrocannabinol: NEGATIVE

## 2023-12-26 LAB — DIFFERENTIAL
Abs Immature Granulocytes: 0.04 K/uL (ref 0.00–0.07)
Basophils Absolute: 0.1 K/uL (ref 0.0–0.1)
Basophils Relative: 1 %
Eosinophils Absolute: 0.4 K/uL (ref 0.0–0.5)
Eosinophils Relative: 4 %
Immature Granulocytes: 0 %
Lymphocytes Relative: 20 %
Lymphs Abs: 1.8 K/uL (ref 0.7–4.0)
Monocytes Absolute: 0.5 K/uL (ref 0.1–1.0)
Monocytes Relative: 6 %
Neutro Abs: 6.5 K/uL (ref 1.7–7.7)
Neutrophils Relative %: 69 %

## 2023-12-26 LAB — COMPREHENSIVE METABOLIC PANEL WITH GFR
ALT: 30 U/L (ref 0–44)
AST: 25 U/L (ref 15–41)
Albumin: 4.3 g/dL (ref 3.5–5.0)
Alkaline Phosphatase: 126 U/L (ref 38–126)
Anion gap: 14 (ref 5–15)
BUN: 40 mg/dL — ABNORMAL HIGH (ref 8–23)
CO2: 19 mmol/L — ABNORMAL LOW (ref 22–32)
Calcium: 9.2 mg/dL (ref 8.9–10.3)
Chloride: 105 mmol/L (ref 98–111)
Creatinine, Ser: 2.4 mg/dL — ABNORMAL HIGH (ref 0.61–1.24)
GFR, Estimated: 29 mL/min — ABNORMAL LOW (ref >60)
Glucose, Bld: 107 mg/dL — ABNORMAL HIGH (ref 70–99)
Potassium: 4.3 mmol/L (ref 3.5–5.1)
Sodium: 138 mmol/L (ref 135–145)
Total Bilirubin: 0.6 mg/dL (ref 0.0–1.2)
Total Protein: 7.7 g/dL (ref 6.5–8.1)

## 2023-12-26 LAB — PROTIME-INR
INR: 0.9 (ref 0.8–1.2)
Prothrombin Time: 13 s (ref 11.4–15.2)

## 2023-12-26 LAB — APTT: aPTT: 30 s (ref 24–36)

## 2023-12-26 LAB — CREATININE, SERUM
Creatinine, Ser: 2.24 mg/dL — ABNORMAL HIGH (ref 0.61–1.24)
GFR, Estimated: 32 mL/min — ABNORMAL LOW (ref >60)

## 2023-12-26 LAB — GLUCOSE, CAPILLARY: Glucose-Capillary: 78 mg/dL (ref 70–99)

## 2023-12-26 LAB — CBG MONITORING, ED: Glucose-Capillary: 90 mg/dL (ref 70–99)

## 2023-12-26 LAB — ETHANOL: Alcohol, Ethyl (B): <15 mg/dL (ref <15)

## 2023-12-26 MED ORDER — ACETAMINOPHEN 650 MG RE SUPP: 650.0000 mg | RECTAL | Status: DC | PRN

## 2023-12-26 MED ORDER — SODIUM CHLORIDE 0.9 % IV SOLN: INTRAVENOUS | Status: AC

## 2023-12-26 MED ORDER — CLOPIDOGREL BISULFATE 75 MG PO TABS
75.0000 mg | ORAL_TABLET | Freq: Every morning | ORAL | Status: DC
Start: 2023-12-27 — End: 2023-12-27
  Administered 2023-12-27: 75 mg via ORAL
  Filled 2023-12-26: qty 1

## 2023-12-26 MED ORDER — ASPIRIN 325 MG PO TABS
325.0000 mg | ORAL_TABLET | Freq: Every day | ORAL | Status: DC
  Administered 2023-12-27: 325 mg via ORAL
  Filled 2023-12-26 (×2): qty 1

## 2023-12-26 MED ORDER — STROKE: EARLY STAGES OF RECOVERY BOOK
Freq: Once | Status: AC
  Filled 2023-12-26: qty 1

## 2023-12-26 MED ORDER — ENOXAPARIN SODIUM 40 MG/0.4ML IJ SOSY
40.0000 mg | PREFILLED_SYRINGE | Freq: Every day | INTRAMUSCULAR | Status: DC
  Administered 2023-12-26 – 2023-12-28 (×3): 40 mg via SUBCUTANEOUS
  Filled 2023-12-26 (×3): qty 0.4

## 2023-12-26 MED ORDER — ATORVASTATIN CALCIUM 80 MG PO TABS
80.0000 mg | ORAL_TABLET | Freq: Every day | ORAL | Status: DC
  Administered 2023-12-26 – 2023-12-28 (×3): 80 mg via ORAL
  Filled 2023-12-26 (×3): qty 1

## 2023-12-26 MED ORDER — INSULIN ASPART 100 UNIT/ML IJ SOLN
0.0000 [IU] | Freq: Three times a day (TID) | INTRAMUSCULAR | Status: DC
  Administered 2023-12-28: 3 [IU] via SUBCUTANEOUS
  Administered 2023-12-28: 1 [IU] via SUBCUTANEOUS
  Administered 2023-12-29: 2 [IU] via SUBCUTANEOUS
  Administered 2023-12-29: 5 [IU] via SUBCUTANEOUS
  Filled 2023-12-26: qty 0.09

## 2023-12-26 MED ORDER — ACETAMINOPHEN 325 MG PO TABS
650.0000 mg | ORAL_TABLET | ORAL | Status: DC | PRN
  Administered 2023-12-26 – 2023-12-29 (×5): 650 mg via ORAL
  Filled 2023-12-26 (×5): qty 2

## 2023-12-26 MED ORDER — INSULIN ASPART 100 UNIT/ML IJ SOLN
0.0000 [IU] | Freq: Every day | INTRAMUSCULAR | Status: DC
  Administered 2023-12-28: 2 [IU] via SUBCUTANEOUS
  Filled 2023-12-26: qty 0.05

## 2023-12-26 MED ORDER — ASPIRIN 300 MG RE SUPP
300.0000 mg | Freq: Every day | RECTAL | Status: DC
Start: 2023-12-26 — End: 2023-12-27

## 2023-12-26 MED ORDER — ACETAMINOPHEN 160 MG/5ML PO SOLN: 650.0000 mg | ORAL | Status: DC | PRN

## 2023-12-26 MED ORDER — HYDRALAZINE HCL 20 MG/ML IJ SOLN
10.0000 mg | Freq: Four times a day (QID) | INTRAMUSCULAR | Status: DC | PRN
  Administered 2023-12-28: 5 mg via INTRAVENOUS
  Administered 2023-12-28: 10 mg via INTRAVENOUS
  Filled 2023-12-26: qty 1

## 2023-12-26 NOTE — ED Notes (Signed)
 [facility] called and floor made aware that [facility] is here and Pt will be  en route shortly.

## 2023-12-26 NOTE — ED Provider Triage Note (Signed)
 Emergency Medicine Provider Triage Evaluation Note  Richard Hunt , a 63 y.o. male  was evaluated in triage.  Pt complains of right facial droop.  Patient has had symptoms for a number of weeks.  Patient reports he has had a right facial droop but his companion noted that it seemed worse today.  Patient reports he has had double vision for quite a long time and is constantly dizzy.  Patient has significantly poorly controlled hypertension.  Patient reports compliance with 5 medications.  He reports he was hospitalized for a week at [facility] and they could never get his blood pressure less than 180 systolic.  Review of Systems  Positive: Double vision Negative: Chest pain  Physical Exam  BP (!) 262/111 (BP Location: Left Arm)   Pulse 92   Temp 98.1 F (36.7 C) (Oral)   Resp 17   SpO2 100%  Gen:   Awake, no distress   Resp:  Normal effort  MSK:   Moves extremities without difficulty  Other:  Heart regular. Neurologic: Patient has visible right facial droop.  Speech is clear.  Content is normal.  No focal motor deficit.  Medical Decision Making  Medically screening exam initiated at 9:01 AM.  Appropriate orders placed.  Chrisean Kloth was informed that the remainder of the evaluation will be completed by another provider, this initial triage assessment does not replace that evaluation, and the importance of remaining in the ED until their evaluation is complete.  Patient is out of window for code stroke.  Symptoms have been indolent and present for weeks to possibly months.  Patient has accelerated hypertension which has been difficult to control.  Will proceed with stroke workup and evaluation for hypertensive emergency.   Armenta Canning, MD 12/26/23 [phone]

## 2023-12-26 NOTE — ED Notes (Signed)
 Family at bedside.

## 2023-12-26 NOTE — ED Notes (Signed)
 Repeat swallow screen and patient is able to take PO meds now.  Patient may require reevaluation again prn.

## 2023-12-26 NOTE — ED Triage Notes (Signed)
 Pt reports with right sided headache, facial drooping, and blurred vision that has worsened today. Pt reports that the symptoms started Monday. Pt was hospitalized within this past month with same complaints.

## 2023-12-26 NOTE — ED Notes (Addendum)
 Patient c/o headache, dizziness and states this has been going on for a long time every day.  He also has left arm pain that also has been going.  He states these have been going on for two weeks.  Patient failed swallow study.

## 2023-12-26 NOTE — H&P (Signed)
 History and Physical    Patient: Richard Hunt FMW:[id number] DOB: 1960-12-20 DOA: 12/26/2023 DOS: the patient was seen and examined on 12/26/2023 PCP: Cletus Glenn  Patient coming from: Home  Chief Complaint: Headache, blurred vision, facial droop Chief Complaint  Patient presents with   Facial Droop   HPI: Richard Hunt is a 63 y.o. male with medical history significant of HTN, HLD, DM2, CKD stage IIIb, chronic venous insufficiency, ischemic CVA, right ICA stenosis, GERD who presented to [facility] ED on 12/26/2023 with complaints of right-sided headache with associated facial drooping and blurred vision.  Family present at bedside who contributes to HPI.  Apparently onset of symptoms Sunday, with acute worsening today.  Now with reported dysphagia, dysarthria.  Recently discharged from [facility] for acute CVA October 26, 2023.  Patient reports that his blood pressure has been very poorly controlled since discharge.  He also states that the medications make him sick.  Last took his medications on Monday.  Patient denies fever/chills/night sweats, no nausea/vomiting/diarrhea, no chest pain, no palpitations, no shortness of breath, no abdominal pain.   In the ED, temperature 98.1 F, HR 92, RR 17, BP 262/111, SpO2 100% on room air.  WBC 9.4, hemoglobin 13.7, platelet count 248.  Sodium 138, potassium 4.3, chloride 105, CO2 19, glucose 107, BUN 40, creatinine 2.40.  AST 25, ALT 30, total bilirubin 0.6.  EtOH level less than 15.  UDS negative.  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  Neurology was consulted.  TRH consulted for admission for further evaluation and management of  acute CVA.   Review of Systems: As mentioned in the history of present illness. All other systems reviewed and are negative. Past Medical History:  Diagnosis Date   Acute cholecystitis 12/11/2016   Acute kidney injury (nontraumatic)    Bilateral edema of lower extremity 01/02/2014   Chronic venous insufficiency 01/16/2014   Diabetes (HCC) 12/11/2016   Diabetes mellitus without complication (HCC)    GERD (gastroesophageal reflux disease)    HLD (hyperlipidemia)    HTN (hypertension) 12/11/2016   Hypercholesterolemia    Hypertension    Osteoarthritis    Polycystic kidney disease    Ulcer of calf (HCC) 08/29/2013   Past Surgical History:  Procedure Laterality Date   CHOLECYSTECTOMY N/A 12/11/2016   Procedure: LAPAROSCOPIC CHOLECYSTECTOMY;  Surgeon: Shelva Dunnings, MD;  Location: [facility] ORS;  Service: General;  Laterality: N/A;   VEIN SURGERY     Social History:  reports that he has never smoked. He has never used smokeless tobacco. He reports that he does not drink alcohol  and does not use drugs.  No Known Allergies  Family History  Problem Relation Age of Onset   Diabetes Mother     Prior to Admission medications   Medication Sig Start Date End Date Taking? Authorizing Provider  aspirin  81 MG chewable tablet Chew 1 tablet (81 mg total) by mouth daily. Patient taking differently: Chew 81 mg by mouth at bedtime. 10/27/23  Yes Fausto Sor A, DO  atorvastatin  (LIPITOR ) 80 MG tablet Take 1 tablet (80 mg total) by mouth at bedtime. 10/26/23  Yes Fausto Sor A, DO  carvedilol  (COREG ) 12.5 MG tablet Take 6.25 mg by mouth 2 (two) times daily with a meal.  Yes [provider]  clopidogrel  (PLAVIX ) 75 MG tablet Take 1 tablet (75 mg total) by mouth daily. 10/27/23  Yes Fausto Sor A, DO  gabapentin  (NEURONTIN ) 100 MG capsule Take 100 mg by mouth at bedtime. 10/02/23  Yes [provider]  glimepiride  (AMARYL ) 1 MG tablet Take 1 mg by mouth daily with breakfast.   Yes  [provider]  losartan  (COZAAR ) 100 MG tablet Take 100 mg by mouth at bedtime.   Yes [provider]  acetaminophen  (TYLENOL ) 325 MG tablet Take 2 tablets (650 mg total) by mouth every 4 (four) hours as needed for mild pain (pain score 1-3) or fever (or temp > 37.5 C (99.5 F)). 10/26/23   Fausto Sor A, DO  glipiZIDE  (GLUCOTROL ) 5 MG tablet Take 1 tablet (5 mg total) by mouth 2 (two) times daily before a meal. Patient not taking: Reported on 12/26/2023 12/13/16 12/26/23  Desiderio Schanz, MD  hydrALAZINE  (APRESOLINE ) 100 MG tablet Take 1 tablet (100 mg total) by mouth 3 (three) times daily. Patient taking differently: Take 100 mg by mouth in the morning, at noon, and at bedtime. 10/26/23   Fausto Sor A, DO  hydrochlorothiazide  (HYDRODIURIL ) 25 MG tablet Take 25 mg by mouth daily. 09/06/23   [provider]  omeprazole (PRILOSEC) 40 MG capsule Take 40 mg by mouth. 08/17/23   [provider]  verapamil  (CALAN ) 80 MG tablet Take 1 tablet (80 mg total) by mouth every 8 (eight) hours. 10/26/23   Fausto Sor LABOR, DO    Physical Exam: Vitals:   12/26/23 1100 12/26/23 1130 12/26/23 1252 12/26/23 1430  BP: (!) 200/96 (!) 167/96  (!) 190/84  Pulse: 76 73  71  Resp: 16 14  14   Temp:   98.4 F (36.9 C)   TempSrc:      SpO2: 100% 99%  100%   GEN: 63 yo male in NAD, alert and oriented x 3, wd/wn HEENT: NCAT, PERRL, EOMI, sclera clear, MMM, + right facial droop PULM: CTAB w/o wheezes/crackles, normal respiratory effort CV: RRR w/o M/G/R GI: abd soft, NTND, + BS MSK: no peripheral edema, moves all extremities independently with preserved muscle strength 5/5 bilateral upper/lower extremities PSYCH: normal mood/affect Integumentary: dry/intact, no rashes or wounds  Neuro Exam Mental Status: A&O x4, + dysarthria, no aphasia (Fluency intact / Comprehension intact / Repetition intact / Naming intact) fund of knowledge wnl, concentration and attention were  appropriate Cranial Nerves: visual fields full, PERRL, EOMi, intact smooth pursuit, no nystagmus, no ptosis, facial sensation intact bilaterally, 5/5 jaw strength, smile asymmetric with right-sided facial droop,  eyebrow  raise & 5/5 eye closure symetric, hearing symmetric and normal to rubbing fingers, palate elevates symmetrically, head turning and shoulder shrug intact and symetric bilaterally, tongue protrusion is midline Motor: no pronator drift, LUE 5/5,   LLE 5/5,   RUE 5/5,   RLE 5/5, normal muscle bulk no significant atrophy, normal tone, no spasticity or rigidity apperciated  Sensory: Sensation is intact to light touch in all 4 ext Coordination/Movement: no tremor noted   Assessment and Plan:  Acute CVA Patient presenting with headache, blurred vision, right sided facial droop with associated dysarthria and dysphagia.  Onset starting 12/22/2023 per family, worsening today. 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 --  Admit to inpatient, transfer to [facility] for neurology evaluation -- N.p.o., pending swallow evaluation -- Allow for permissive hypertension up to 220/110 -- CT angiogram head/neck -- TTE -- Repeat lipid panel, hemoglobin A1c -- Monitor on telemetry -- Aspirin /Plavix  -- Atorvastatin  80 mg p.o. 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  Family ports patient has follow-up with vascular surgery scheduled in February 2026. -- CT angiogram head/neck: Pending  Hypertensive emergency Essential hypertension, poorly controlled Home regimen includes losartan  100 mg p.o. daily, hydrochlorothiazide  25 mg p.o. daily, hydralazine  100 mg p.o. 3 times daily, carvedilol  25 mg PO BID, verapamil  80 mg p.o. every 8 hours.  Patient reports poor compliance as these medications made him feel sick.  Reports last taking his blood pressure medicines 3 days prior.  Blood pressure elevated 262/111 on admission. -- Holding home and hypertensives to allow permissive hypertension in the setting of acute CVA -- Hydralazine  10 mg IV q6h PRN SBP >220 or DBP >110  Type 2 diabetes mellitus Home regimen includes glimepiride  1 mg p.o. daily.  Most recent hemoglobin A1c 6.1 on 10/20/2023. -- Repeat hemoglobin A1c -- Sensitive SSI for coverage -- CBG before every meal/at bedtime  CKD stage IIIb Baseline creatinine 2.2-2.4.  Creatinine 2.40 on admission, stable. -- Avoid nephrotoxins, renal cell medications -- BMP in a.m.  Hyperlipidemia -- Lipid panel in a.m.    Advance Care Planning:   Code Status: Full Code   Consults: Neurology  Family Communication: Spouse and son present at bedside  Severity of Illness: The appropriate patient status for this patient is INPATIENT. Inpatient status is judged to be reasonable and necessary in order to provide the required intensity of service to ensure the patient's safety. The patient's presenting symptoms, physical exam findings, and initial radiographic and laboratory data in the context of their chronic comorbidities is felt to place them at high risk for further clinical deterioration. Furthermore, it is not anticipated that the patient will be medically stable for discharge from the hospital within 2 midnights of admission.   * I certify that at the point of admission it is my clinical judgment that  the patient will require inpatient hospital care spanning beyond 2 midnights from the point of admission due to high intensity of service, high risk for further deterioration and high frequency of surveillance required.*  Author: Camellia PARAS [place], DO 12/26/2023 3:21 PM  For on call review [email or web address].

## 2023-12-26 NOTE — ED Notes (Signed)
 Patient returned from MRI and placed back on monitoring equipment.  Patient denies other needs at this time.

## 2023-12-26 NOTE — ED Notes (Signed)
 [facility] called [phone].

## 2023-12-26 NOTE — ED Provider Notes (Signed)
 [place] EMERGENCY DEPARTMENT AT [facility] Provider Note   CSN: [id number] Arrival date & time: 12/26/23  9152     Patient presents with: Facial Droop   Richard Hunt is a 63 y.o. male.   HPI Patient reports he was admitted to [facility] about 6 weeks ago.  He reports at that time he had dizziness and some facial droop and visual disturbance.  Since discharge, he reports his blood pressures have remained very high despite taking medications.  He reports that he still feels symptomatic with dizziness.  His family members have noticed that he had increasing right facial droop over the past few days and patient reports he is still dealing with double vision.  He has also had a fairly persistent but waxing and waning in severity right sided posterior headache.  Patient medication list includes Plavix  and aspirin .    Prior to Admission medications   Medication Sig Start Date End Date Taking? Authorizing Provider  aspirin  81 MG chewable tablet Chew 1 tablet (81 mg total) by mouth daily. Patient taking differently: Chew 81 mg by mouth at bedtime. 10/27/23  Yes Fausto Sor A, DO  atorvastatin  (LIPITOR ) 80 MG tablet Take 1 tablet (80 mg total) by mouth at bedtime. 10/26/23  Yes Fausto Sor A, DO  carvedilol  (COREG ) 12.5 MG tablet Take 12.5 mg by mouth in the morning.   Yes [provider]  gabapentin  (NEURONTIN ) 100 MG capsule Take 100 mg by mouth at bedtime. 10/02/23  Yes [provider]  glimepiride  (AMARYL ) 1 MG tablet Take 1 mg by mouth daily with breakfast.   Yes [provider]  hydrALAZINE  (APRESOLINE ) 100 MG tablet Take 1 tablet (100 mg total) by mouth 3 (three) times daily. Patient taking differently: Take 100 mg by mouth in the morning, at noon, and at bedtime. 10/26/23  Yes Fausto Sor A, DO  hydrochlorothiazide  (HYDRODIURIL ) 25 MG tablet Take 25 mg by mouth daily in the afternoon. 09/06/23  Yes [provider]  losartan  (COZAAR ) 100 MG tablet Take 100 mg by mouth at bedtime.   Yes [provider]  verapamil  (CALAN ) 80 MG tablet Take 1 tablet (80 mg total) by mouth every 8 (eight) hours. 10/26/23  Yes Fausto Sor A, DO  acetaminophen  (TYLENOL ) 325 MG tablet Take 2 tablets (650 mg total) by mouth every 4 (four) hours as needed for mild pain (pain score 1-3) or fever (or temp > 37.5 C (99.5 F)). Patient not taking: Reported on 12/26/2023 10/26/23   Fausto Sor A, DO  artificial tears ophthalmic solution Place 1 drop into both eyes as needed for dry eyes. 12/29/23   Samtani, Jai-Gurmukh, MD  cloNIDine  (CATAPRES ) 0.1 MG tablet Take 1 tablet (0.1 mg total) by mouth daily as needed (bP >180 systolic). 12/29/23   Samtani, Jai-Gurmukh, MD  pantoprazole  (PROTONIX ) 40 MG tablet Take 1 tablet (40 mg total) by mouth daily. 12/29/23 02/27/24  Samtani, Jai-Gurmukh, MD  ticagrelor  (BRILINTA ) 90 MG TABS tablet Take 1 tablet (90 mg total) by mouth 2 (two) times daily. 12/29/23   Samtani, Jai-Gurmukh, MD  valACYclovir  (VALTREX ) 1000 MG tablet Take 1 tablet (1,000 mg total) by mouth every 12 (twelve) hours. 12/29/23   Samtani, Jai-Gurmukh, MD    Allergies: Patient has no known allergies.    Review of Systems  Updated Vital Signs BP (!) 119/55 (BP Location: Left Arm)   Pulse (!) 54   Temp 97.6 F (36.4 C) (Oral)   Resp 14  Ht 5' 5 (1.651 m)   Wt 88.5 kg   SpO2 100%   BMI 32.45 kg/m   Physical Exam Constitutional:      Comments: Patient is alert.  No acute distress.  Mental status clear.  HENT:     Mouth/Throat:     Pharynx: Oropharynx is clear.  Eyes:     Extraocular Movements: Extraocular movements intact.     Pupils: Pupils are equal, round, and reactive to light.  Cardiovascular:     Rate and Rhythm: Normal rate and regular rhythm.  Pulmonary:     Effort: Pulmonary effort is normal.     Breath sounds: Normal breath sounds.  Abdominal:     General: There is no distension.      Palpations: Abdomen is soft.     Tenderness: There is no abdominal tenderness. There is no guarding.  Musculoskeletal:        General: Normal range of motion.     Cervical back: Neck supple.  Skin:    General: Skin is warm and dry.  Neurological:     Comments: Right sided facial droop.  Speech is clear with normal content.  Upper and lower extremity motor strength symmetric.  No incoordination.  Patient follows commands appropriately without difficulty.     (all labs ordered are listed, but only abnormal results are displayed) Labs Reviewed  COMPREHENSIVE METABOLIC PANEL WITH GFR - Abnormal; Notable for the following components:      Result Value   CO2 19 (*)    Glucose, Bld 107 (*)    BUN 40 (*)    Creatinine, Ser 2.40 (*)    GFR, Estimated 29 (*)    All other components within normal limits  LIPID PANEL - Abnormal; Notable for the following components:   HDL 30 (*)    All other components within normal limits  CBC - Abnormal; Notable for the following components:   RBC 3.81 (*)    Hemoglobin 11.9 (*)    HCT 34.2 (*)    All other components within normal limits  CREATININE, SERUM - Abnormal; Notable for the following components:   Creatinine, Ser 2.24 (*)    GFR, Estimated 32 (*)    All other components within normal limits  BASIC METABOLIC PANEL WITH GFR - Abnormal; Notable for the following components:   CO2 21 (*)    BUN 30 (*)    Creatinine, Ser 2.15 (*)    Calcium  8.6 (*)    GFR, Estimated 33 (*)    All other components within normal limits  SEDIMENTATION RATE - Abnormal; Notable for the following components:   Sed Rate 50 (*)    All other components within normal limits  GLUCOSE, CAPILLARY - Abnormal; Notable for the following components:   Glucose-Capillary 112 (*)    All other components within normal limits  BASIC METABOLIC PANEL WITH GFR - Abnormal; Notable for the following components:   CO2 20 (*)    BUN 31 (*)    Creatinine, Ser 2.25 (*)     Calcium  8.6 (*)    GFR, Estimated 32 (*)    All other components within normal limits  GLUCOSE, CAPILLARY - Abnormal; Notable for the following components:   Glucose-Capillary 116 (*)    All other components within normal limits  GLUCOSE, CAPILLARY - Abnormal; Notable for the following components:   Glucose-Capillary 138 (*)    All other components within normal limits  GLUCOSE, CAPILLARY - Abnormal; Notable for the following components:  Glucose-Capillary 206 (*)    All other components within normal limits  GLUCOSE, CAPILLARY - Abnormal; Notable for the following components:   Glucose-Capillary 219 (*)    All other components within normal limits  GLUCOSE, CAPILLARY - Abnormal; Notable for the following components:   Glucose-Capillary 186 (*)    All other components within normal limits  GLUCOSE, CAPILLARY - Abnormal; Notable for the following components:   Glucose-Capillary 171 (*)    All other components within normal limits  ETHANOL  PROTIME-INR  APTT  CBC  DIFFERENTIAL  URINE DRUG SCREEN  HEMOGLOBIN A1C  GLUCOSE, CAPILLARY  GLUCOSE, CAPILLARY  CARDIOLIPIN ANTIBODIES, IGG, IGM, IGA  GLUCOSE, CAPILLARY  GLUCOSE, CAPILLARY  CBG MONITORING, ED  TROPONIN I (HIGH SENSITIVITY)    EKG: EKG Interpretation Date/Time:  Wednesday December 26 2023 08:51:07 EDT Ventricular Rate:  91 PR Interval:  180 QRS Duration:  100 QT Interval:  367 QTC Calculation: 452 R Axis:   14  Text Interpretation: Sinus rhythm No significant change since last tracing Confirmed by Dreama Longs ([id number]) on 12/27/2023 9:46:16 AM  Radiology: No results found.    Procedures   Medications Ordered in the ED  0.9 %  sodium chloride  infusion (0 mLs Intravenous Stopped 12/27/23 1742)   stroke: early stages of recovery book ( Does not apply Given 12/27/23 0859)                                    Medical Decision Making Amount and/or Complexity of Data Reviewed Labs: ordered. Radiology:  ordered.  Risk Decision regarding hospitalization.  Patient has delayed presentation for stroke like symptoms evaluated at [facility] 6 weeks ago.  Since that time he has had persistently elevated blood pressure and felt symptomatic with dizziness.  He did have a right facial droop previously per her report but it is now perceived to be worse.  Will proceed with stroke workup.  Differential also includes hypertensive urgency\emergency.  Ever, patient shows no signs of being encephalopathic.  He is not confused or somnolent.  No incoordination.  MRI shows acute and subacute infarcts with areas of severe stenosis and occlusion of the right internal carotid artery.  Consult: Reviewed with Dr. Michaela.  He has reviewed imaging and advises admission to [facility] for neurology consultation and management.  Family and patient are updated on findings.  Patient is stable.  Plan will be for transfer per consultation with neurology.     Final diagnoses:  Cerebrovascular accident (CVA), unspecified mechanism [facility])    ED Discharge Orders          Ordered    cloNIDine  (CATAPRES ) 0.1 MG tablet  Daily PRN        12/29/23 0753    valACYclovir  (VALTREX ) 1000 MG tablet  Every 12 hours        12/29/23 0753    predniSONE  (DELTASONE ) 20 MG tablet  Daily with breakfast        12/29/23 0753    ticagrelor  (BRILINTA ) 90 MG TABS tablet  2 times daily        12/29/23 0753    artificial tears ophthalmic solution  As needed        12/29/23 0753    pantoprazole  (PROTONIX ) 40 MG tablet  Daily        12/29/23 0753    Increase activity slowly        12/29/23 0753    Diet -  low sodium heart healthy        12/29/23 0753    Discharge instructions       Comments: This hospital stay you were diagnosed with a recurrent stroke Your medications have changed to some degree and you will be taking a new tablet called Brilinta  twice a day in addition to your daily aspirin  that we will call into our local pharmacy as  we are able to get it at a substantially better price Do not take Plavix  any longer I have added additionally a tablet called clonidine  for blood pressure >180 systolic Take all your other blood pressure meds  For the Bell's palsy continue the antiviral as well as the prednisone  that neurology prescribed and complete them You may need to use eyedrops to help protect your eyes I would recommend follow-up with your primary   12/29/23 0753               Armenta Canning, MD 01/04/24 2050

## 2023-12-26 NOTE — ED Notes (Signed)
 Family updated as to transport time, new room number.  They plan on having a family member there this evening.  They are aware the visiting hours may be limited.

## 2023-12-26 NOTE — Consult Note (Signed)
 NEUROLOGY CONSULT NOTE   Date of service: December 26, 2023 Patient Name: Richard Hunt MRN:  [id number] DOB:  1960-11-04 Chief Complaint: R sided headache and facial droop and blurred vision and found to have scattered small R hemispheric strokes Requesting Provider: [place], Eric J, DO  History of Present Illness  Richard Hunt is a 63 y.o. male with hx of HTN, HLD, DM2, CKD 3b, prior stroke in July of this year, GERD, R ICA stenosis noted on MRA in July 2025 who presents with R sided headache, Facial droop and blurred vision.  He initially presented to [facility] where he had MRI Brain which demonstrated scattered small R hemispheric strokes.  He reports he had chest pain, L arm pain along with headache and a R upper and lower facial droop which brought him to the ED.  LKW: 4 days ago Modified rankin score: 0-Completely asymptomatic and back to baseline post- stroke IV Thrombolysis: not offered, outside window EVT: not offered, outside window.  NIHSS components Score: Comment  1a Level of Conscious 0[x]  1[]  2[]  3[]      1b LOC Questions 0[x]  1[]  2[]       1c LOC Commands 0[x]  1[]  2[]       2 Best Gaze 0[x]  1[]  2[]       3 Visual 0[x]  1[]  2[]  3[]      4 Facial Palsy 0[]  1[]  2[x]  3[]    R upper and lower facial droop consistent with bells palsy.  5a Motor Arm - left 0[x]  1[]  2[]  3[]  4[]  UN[]    5b Motor Arm - Right 0[x]  1[]  2[]  3[]  4[]  UN[]    6a Motor Leg - Left 0[x]  1[]  2[]  3[]  4[]  UN[]    6b Motor Leg - Right 0[x]  1[]  2[]  3[]  4[]  UN[]    7 Limb Ataxia 0[x]  1[]  2[]  UN[]      8 Sensory 0[]  1[x]  2[]  UN[]    Slightly decreased in L leg  9 Best Language 0[x]  1[]  2[]  3[]      10 Dysarthria 0[x]  1[]  2[]  UN[]      11 Extinct. and Inattention 0[x]  1[]  2[]       TOTAL: 3      ROS  Comprehensive ROS performed and pertinent positives documented in HPI   Past History   Past Medical History:  Diagnosis Date   Acute cholecystitis 12/11/2016   Acute kidney injury (nontraumatic)     Bilateral edema of lower extremity 01/02/2014   Chronic venous insufficiency 01/16/2014   Diabetes (HCC) 12/11/2016   Diabetes mellitus without complication (HCC)    GERD (gastroesophageal reflux disease)    HLD (hyperlipidemia)    HTN (hypertension) 12/11/2016   Hypercholesterolemia    Hypertension    Osteoarthritis    Polycystic kidney disease    Ulcer of calf (HCC) 08/29/2013    Past Surgical History:  Procedure Laterality Date   CHOLECYSTECTOMY N/A 12/11/2016   Procedure: LAPAROSCOPIC CHOLECYSTECTOMY;  Surgeon: Shelva Dunnings, MD;  Location: [facility] ORS;  Service: General;  Laterality: N/A;   VEIN SURGERY      Family History: Family History  Problem Relation Age of Onset   Diabetes Mother     Social History  reports that he has never smoked. He has never used smokeless tobacco. He reports that he does not drink alcohol  and does not use drugs.  No Known Allergies  Medications   Current Facility-Administered Medications:    [START ON 12/27/2023]  stroke: early stages of recovery book, , Does not apply, Once, [place], Eric J, DO   0.9 %  sodium chloride  infusion, , Intravenous, Continuous, [place], Camellia PARAS, [place], Last Rate: 40 mL/hr at 12/26/23 1748, New Bag at 12/26/23 1748   acetaminophen  (TYLENOL ) tablet 650 mg, 650 mg, Oral, Q4H PRN, 650 mg at 12/26/23 1718 **OR** acetaminophen  (TYLENOL ) 160 MG/5ML solution 650 mg, 650 mg, Per Tube, Q4H PRN **OR** acetaminophen  (TYLENOL ) suppository 650 mg, 650 mg, Rectal, Q4H PRN, [place], Eric J, DO   aspirin  suppository 300 mg, 300 mg, Rectal, Daily **OR** aspirin  tablet 325 mg, 325 mg, Oral, Daily, [place], Eric J, DO   atorvastatin  (LIPITOR ) tablet 80 mg, 80 mg, Oral, QHS, [place], Eric J, DO   [START ON 12/27/2023] clopidogrel  (PLAVIX ) tablet 75 mg, 75 mg, Oral, q AM, [place], Eric J, DO   enoxaparin  (LOVENOX ) injection 40 mg, 40 mg, Subcutaneous, QHS, [place], Eric J, DO   hydrALAZINE  (APRESOLINE ) injection 10 mg, 10 mg, Intravenous, Q6H  PRN, [place], Eric J, DO   insulin  aspart (novoLOG ) injection 0-5 Units, 0-5 Units, Subcutaneous, QHS, [place], Eric J, DO   [START ON 12/27/2023] insulin  aspart (novoLOG ) injection 0-9 Units, 0-9 Units, Subcutaneous, TID WC, [place], Camellia PARAS, DO  Current Outpatient Medications:    aspirin  81 MG chewable tablet, Chew 1 tablet (81 mg total) by mouth daily. (Patient taking differently: Chew 81 mg by mouth at bedtime.), Disp: 30 tablet, Rfl: 2   atorvastatin  (LIPITOR ) 80 MG tablet, Take 1 tablet (80 mg total) by mouth at bedtime., Disp: 30 tablet, Rfl: 2   carvedilol  (COREG ) 12.5 MG tablet, Take 12.5 mg by mouth in the morning., Disp: , Rfl:    clopidogrel  (PLAVIX ) 75 MG tablet, Take 1 tablet (75 mg total) by mouth daily. (Patient taking differently: Take 75 mg by mouth in the morning.), Disp: 30 tablet, Rfl: 2   gabapentin  (NEURONTIN ) 100 MG capsule, Take 100 mg by mouth at bedtime., Disp: , Rfl:    glimepiride  (AMARYL ) 1 MG tablet, Take 1 mg by mouth daily with breakfast., Disp: , Rfl:    hydrALAZINE  (APRESOLINE ) 100 MG tablet, Take 1 tablet (100 mg total) by mouth 3 (three) times daily. (Patient taking differently: Take 100 mg by mouth in the morning, at noon, and at bedtime.), Disp: 90 tablet, Rfl: 2   hydrochlorothiazide  (HYDRODIURIL ) 25 MG tablet, Take 25 mg by mouth daily in the afternoon., Disp: , Rfl:    losartan  (COZAAR ) 100 MG tablet, Take 100 mg by mouth at bedtime., Disp: , Rfl:    verapamil  (CALAN ) 80 MG tablet, Take 1 tablet (80 mg total) by mouth every 8 (eight) hours., Disp: 90 tablet, Rfl: 2   acetaminophen  (TYLENOL ) 325 MG tablet, Take 2 tablets (650 mg total) by mouth every 4 (four) hours as needed for mild pain (pain score 1-3) or fever (or temp > 37.5 C (99.5 F)). (Patient not taking: Reported on 12/26/2023), Disp: , Rfl:    glipiZIDE  (GLUCOTROL ) 5 MG tablet, Take 1 tablet (5 mg total) by mouth 2 (two) times daily before a meal. (Patient not taking: Reported on 12/26/2023), Disp: 60  tablet, Rfl: 0  Vitals   Vitals:   12/26/23 1500 12/26/23 1600 12/26/23 1700 12/26/23 1900  BP: (!) 198/92 (!) 162/78 (!) 179/83 (!) 184/81  Pulse: 72   69  Resp: 14 14 12 14   Temp:      TempSrc:      SpO2: 99% 99% 99% 98%    There is no height or weight on file to calculate BMI.   Physical Exam   General: Laying comfortably in  bed; in no acute distress.  HENT: Normal oropharynx and mucosa. Normal external appearance of ears and nose.  Neck: Supple, no pain or tenderness  CV: No JVD. No peripheral edema.  Pulmonary: Symmetric Chest rise. Normal respiratory effort.  Abdomen: Soft to touch, non-tender.  Ext: No cyanosis, edema, or deformity  Skin: No rash. Normal palpation of skin.   Musculoskeletal: Normal digits and nails by inspection. No clubbing.   Neurologic Examination  Mental status/Cognition: Alert, oriented to self, place, month and year, good attention.  Speech/language: Fluent, comprehension intact, object naming intact, repetition intact.  Cranial nerves:   CN II Pupils equal and reactive to light, no VF deficits    CN III,IV,VI EOM intact, no gaze preference or deviation, no nystagmus    CN V normal sensation in V1, V2, and V3 segments bilaterally    CN VII R upper and lower facial droop with weak eye closure.   CN VIII normal hearing to speech    CN IX & X normal palatal elevation, no uvular deviation    CN XI 5/5 head turn and 5/5 shoulder shrug bilaterally    CN XII midline tongue protrusion    Motor:  Muscle bulk: normal, tone normal, pronator drift none tremor none Mvmt Root Nerve  Muscle Right Left Comments  SA C5/6 Ax Deltoid     EF C5/6 Mc Biceps 5 5   EE C6/7/8 Rad Triceps 5 5   WF C6/7 Med FCR     WE C7/8 PIN ECU     F Ab C8/T1 U ADM/FDI 5 5   HF L1/2/3 Fem Illopsoas 5 4   KE L2/3/4 Fem Quad     DF L4/5 D Peron Tib Ant 5 5   PF S1/2 Tibial Grc/Sol 5 5    Sensation:  Light touch Slightly decreased in LLE   Pin prick    Temperature     Vibration   Proprioception    Coordination/Complex Motor:  - Finger to Nose intact BL - Heel to shin intact BL - Rapid alternating movement are slowed in L leg. - Gait: deferred.  Labs/Imaging/Neurodiagnostic studies   CBC:  Recent Labs  Lab 01/19/2024 0850  WBC 9.4  NEUTROABS 6.5  HGB 13.7  HCT 40.3  MCV 91.8  PLT 248   Basic Metabolic Panel:  Lab Results  Component Value Date   NA 138 2024/01/19   K 4.3 2024-01-19   CO2 19 (L) Jan 19, 2024   GLUCOSE 107 (H) 2024/01/19   BUN 40 (H) Jan 19, 2024   CREATININE 2.40 (H) 2024/01/19   CALCIUM  9.2 19-Jan-2024   GFRNONAA 29 (L) 19-Jan-2024   GFRAA >60 12/12/2016   Lipid Panel:  Lab Results  Component Value Date   LDLCALC 74 10/21/2023   HgbA1c:  Lab Results  Component Value Date   HGBA1C 6.1 (H) 10/20/2023   Urine Drug Screen:     Component Value Date/Time   LABOPIA NEGATIVE 2024/01/19 0957   COCAINSCRNUR NEGATIVE 01/19/24 0957   COCAINSCRNUR NONE DETECTED 10/20/2023 1618   LABBENZ NEGATIVE 2024/01/19 0957   AMPHETMU NEGATIVE Jan 19, 2024 0957   THCU NEGATIVE 19-Jan-2024 0957   LABBARB NEGATIVE Jan 19, 2024 0957    Alcohol  Level     Component Value Date/Time   ETH <15 19-Jan-2024 0850   INR  Lab Results  Component Value Date   INR 0.9 01/19/2024   APTT  Lab Results  Component Value Date   APTT 30 19-Jan-2024   AED levels: No results found for: PHENYTOIN, ZONISAMIDE,  LAMOTRIGINE, LEVETIRACETA  MR Angio head without contrast and Carotid Duplex BL(Personally reviewed): pending  MRI Brain(Personally reviewed): 1. Few small acute/subacute infarcts within the corona radiata, parietal lobe and occipital lobe on the right (measuring up to 5 mm). 2. Background age-advanced chronic small vessel ischemic disease with multiple chronic lacunar infarcts, as described. 3. Chronically abnormal right internal carotid artery flow void. Sites of severe stenosis and occlusion were demonstrated within this vessel on  the prior MRA head/neck of 10/20/2023. Please refer to these prior examinations for further description. 4. Small-volume fluid within bilateral mastoid air cells.  ASSESSMENT   Richard Hunt is a 63 y.o. male with hx of HTN, HLD, DM2, CKD 3b, prior stroke in July of this year, GERD, R ICA stenosis noted on MRA in July 2025 who presents with R sided headache, Facial droop and blurred vision. Found to have small scattered R hemispheric infarts   Neuro exam with R bells palsy that is not well explained by the strokes noted on MRI. Exam also notable for L hip flexion weakness and slightly decreased sensation to touch in L foot that maybe explained by the noted scattered R hemispheric strokes.  Etiology of stroke is pending workup but high concern for R ICA stenosis/occlusion. Given poor renal function, will get MRA Head w/o C and US  carotid doppler.  RECOMMENDATIONS  - Frequent Neuro checks per stroke unit protocol - Recommend brain imaging with MRI Brain without contrast - Recommend Vascular imaging with MRA Angio Head without contrast and US  Carotid doppler - Recommend obtaining TTE  - Recommend obtaining Lipid panel with LDL - Please start statin if LDL > 70 - Recommend HbA1c to evaluate for diabetes and how well it is controlled. - Antithrombotic - Aspirin  81mg  daily along with plavix  75mg  daily x 21days, followed by Aspirin  81mg  daily alone - Recommend DVT ppx - SBP goal - permissive hypertension first 24 h < 220/110. Held home meds.  - Recommend Telemetry monitoring for arrythmia - Recommend bedside swallow screen prior to PO intake. - Stroke education booklet - Recommend PT/OT/SLP consult ______________________________________________________________________    Signed, Sheccid Lahmann, MD [facility]

## 2023-12-26 NOTE — ED Notes (Signed)
 Pt BP remains above 200 systolic.  EDP notified.  No additional orders at this time

## 2023-12-27 ENCOUNTER — Telehealth (HOSPITAL_COMMUNITY): Payer: Self-pay | Admitting: Pharmacy Technician

## 2023-12-27 ENCOUNTER — Inpatient Hospital Stay (HOSPITAL_COMMUNITY)

## 2023-12-27 ENCOUNTER — Other Ambulatory Visit (HOSPITAL_COMMUNITY): Payer: Self-pay

## 2023-12-27 DIAGNOSIS — I63231 Cerebral infarction due to unspecified occlusion or stenosis of right carotid arteries: Secondary | ICD-10-CM | POA: Diagnosis not present

## 2023-12-27 DIAGNOSIS — I7090 Unspecified atherosclerosis: Secondary | ICD-10-CM | POA: Diagnosis not present

## 2023-12-27 DIAGNOSIS — R29701 NIHSS score 1: Secondary | ICD-10-CM | POA: Diagnosis not present

## 2023-12-27 DIAGNOSIS — I639 Cerebral infarction, unspecified: Secondary | ICD-10-CM | POA: Diagnosis not present

## 2023-12-27 DIAGNOSIS — I6389 Other cerebral infarction: Secondary | ICD-10-CM | POA: Diagnosis not present

## 2023-12-27 DIAGNOSIS — Z7982 Long term (current) use of aspirin: Secondary | ICD-10-CM

## 2023-12-27 DIAGNOSIS — Z7902 Long term (current) use of antithrombotics/antiplatelets: Secondary | ICD-10-CM

## 2023-12-27 DIAGNOSIS — G510 Bell's palsy: Secondary | ICD-10-CM | POA: Diagnosis not present

## 2023-12-27 LAB — LIPID PANEL
Cholesterol: 96 mg/dL (ref 0–200)
HDL: 30 mg/dL — ABNORMAL LOW (ref >40)
LDL Cholesterol: 50 mg/dL (ref 0–99)
Total CHOL/HDL Ratio: 3.2 ratio
Triglycerides: 79 mg/dL (ref <150)
VLDL: 16 mg/dL (ref 0–40)

## 2023-12-27 LAB — BASIC METABOLIC PANEL WITH GFR
Anion gap: 7 (ref 5–15)
BUN: 30 mg/dL — ABNORMAL HIGH (ref 8–23)
CO2: 21 mmol/L — ABNORMAL LOW (ref 22–32)
Calcium: 8.6 mg/dL — ABNORMAL LOW (ref 8.9–10.3)
Chloride: 109 mmol/L (ref 98–111)
Creatinine, Ser: 2.15 mg/dL — ABNORMAL HIGH (ref 0.61–1.24)
GFR, Estimated: 33 mL/min — ABNORMAL LOW (ref >60)
Glucose, Bld: 94 mg/dL (ref 70–99)
Potassium: 3.9 mmol/L (ref 3.5–5.1)
Sodium: 137 mmol/L (ref 135–145)

## 2023-12-27 LAB — HEMOGLOBIN A1C
Hgb A1c MFr Bld: 5.1 % (ref 4.8–5.6)
Mean Plasma Glucose: 99.67 mg/dL

## 2023-12-27 LAB — ECHOCARDIOGRAM COMPLETE
AR max vel: 2.16 cm2
AV Area VTI: 2.13 cm2
AV Area mean vel: 2.16 cm2
AV Mean grad: 6 mmHg
AV Peak grad: 11.4 mmHg
Ao pk vel: 1.69 m/s
Area-P 1/2: 3.42 cm2
Calc EF: 58.7 %
Est EF: 55
Height: 65 in
MV M vel: 3.37 m/s
MV Peak grad: 45.4 mmHg
S' Lateral: 4.1 cm
Single Plane A2C EF: 51.3 %
Single Plane A4C EF: 62.4 %
Weight: 3120 [oz_av]

## 2023-12-27 LAB — GLUCOSE, CAPILLARY
Glucose-Capillary: 87 mg/dL (ref 70–99)
Glucose-Capillary: 83 mg/dL (ref 70–99)
Glucose-Capillary: 112 mg/dL — ABNORMAL HIGH (ref 70–99)
Glucose-Capillary: 116 mg/dL — ABNORMAL HIGH (ref 70–99)

## 2023-12-27 LAB — SEDIMENTATION RATE: Sed Rate: 50 mm/h — ABNORMAL HIGH (ref 0–16)

## 2023-12-27 LAB — TROPONIN I (HIGH SENSITIVITY): Troponin I (High Sensitivity): 17 ng/L (ref <18)

## 2023-12-27 MED ORDER — POLYVINYL ALCOHOL 1.4 % OP SOLN
1.0000 [drp] | OPHTHALMIC | Status: DC | PRN
  Administered 2023-12-28: 1 [drp] via OPHTHALMIC
  Filled 2023-12-27: qty 15

## 2023-12-27 MED ORDER — VALACYCLOVIR HCL 500 MG PO TABS
1000.0000 mg | ORAL_TABLET | Freq: Two times a day (BID) | ORAL | Status: DC
  Administered 2023-12-27 – 2023-12-29 (×5): 1000 mg via ORAL
  Filled 2023-12-27 (×6): qty 2

## 2023-12-27 MED ORDER — ASPIRIN 81 MG PO TBEC
81.0000 mg | DELAYED_RELEASE_TABLET | Freq: Every day | ORAL | Status: DC
  Administered 2023-12-28 – 2023-12-29 (×2): 81 mg via ORAL
  Filled 2023-12-27 (×2): qty 1

## 2023-12-27 MED ORDER — TICAGRELOR 90 MG PO TABS
90.0000 mg | ORAL_TABLET | Freq: Two times a day (BID) | ORAL | Status: DC
Start: 2023-12-27 — End: 2023-12-29
  Administered 2023-12-27 – 2023-12-29 (×5): 90 mg via ORAL
  Filled 2023-12-27 (×5): qty 1

## 2023-12-27 MED ORDER — PANTOPRAZOLE SODIUM 40 MG PO TBEC
40.0000 mg | DELAYED_RELEASE_TABLET | Freq: Every day | ORAL | Status: DC
  Administered 2023-12-27 – 2023-12-29 (×3): 40 mg via ORAL
  Filled 2023-12-27 (×3): qty 1

## 2023-12-27 MED ORDER — PREDNISONE 5 MG PO TABS: 50.0000 mg | ORAL_TABLET | Freq: Every day | ORAL | Status: DC

## 2023-12-27 MED ORDER — PREDNISONE 20 MG PO TABS
60.0000 mg | ORAL_TABLET | Freq: Every day | ORAL | Status: DC
  Administered 2023-12-27 – 2023-12-29 (×3): 60 mg via ORAL
  Filled 2023-12-27 (×3): qty 3

## 2023-12-27 NOTE — Progress Notes (Signed)
 Carotid and TCD studies completed. Ricka Elder Molt, RDMS, RVT

## 2023-12-27 NOTE — Evaluation (Addendum)
 Speech Language Pathology Evaluation Patient Details Name: Richard Hunt MRN: [id number] DOB: 1960/12/01 Today's Date: 12/27/2023 Time: 9143-9089 SLP Time Calculation (min) (ACUTE ONLY): 14 min  Problem List:  Patient Active Problem List   Diagnosis Date Noted   Acute CVA (cerebrovascular accident) (HCC) 12/26/2023   CVA (cerebral vascular accident) (HCC) 10/20/2023   GERD (gastroesophageal reflux disease) 10/20/2023   Acute cholecystitis 12/11/2016   Diabetes (HCC) 12/11/2016   HTN (hypertension) 12/11/2016   HLD (hyperlipidemia) 12/11/2016   Chronic venous insufficiency 01/16/2014   Bilateral edema of lower extremity 01/02/2014   Ulcer of calf (HCC) 08/29/2013   Past Medical History:  Past Medical History:  Diagnosis Date   Acute cholecystitis 12/11/2016   Acute kidney injury (nontraumatic)    Bilateral edema of lower extremity 01/02/2014   Chronic venous insufficiency 01/16/2014   Diabetes (HCC) 12/11/2016   Diabetes mellitus without complication (HCC)    GERD (gastroesophageal reflux disease)    HLD (hyperlipidemia)    HTN (hypertension) 12/11/2016   Hypercholesterolemia    Hypertension    Osteoarthritis    Polycystic kidney disease    Ulcer of calf (HCC) 08/29/2013   Past Surgical History:  Past Surgical History:  Procedure Laterality Date   CHOLECYSTECTOMY N/A 12/11/2016   Procedure: LAPAROSCOPIC CHOLECYSTECTOMY;  Surgeon: Shelva Dunnings, MD;  Location: [facility] ORS;  Service: General;  Laterality: N/A;   VEIN SURGERY     HPI:  Richard Hunt is a 63 y.o. male with hx of HTN, HLD, DM2, CKD 3b, prior stroke in July of this year, GERD, R ICA stenosis noted on MRA in July 2025 who presents with R sided headache, Facial droop and blurred vision. Found to have small scattered R hemispheric infarts      Neuro exam with R bells palsy that is not well explained by the strokes noted on MRI. Exam also notable for L hip flexion weakness and slightly decreased  sensation to touch in L foot that maybe explained by the noted scattered R hemispheric strokes.   Assessment / Plan / Recommendation Clinical Impression  Pt demonstrates a mild dysarthria, language function in tact, pt assessed with Spanish speaking SLP. Mild left facial nerve weakness impacts precision with articulatory contacts. SLP explained how increased volume and over articulation can improve speech intelligiblity. Pt repeated multisyllabic word after model with improved accuracy. Pt reports he is independent and retired. SLP initiated cognitive assessment to evaluate memory and attention and pt refused to participate. Said he was fine and didnt need any further testing despite SLP encouragement. Pt would benefit from SLP OP f/u for speech, but feels his own will to improve will be enough and is not interested in therapy. Will sign off acutely.    SLP Assessment  SLP Recommendation/Assessment: All further Speech Language Pathology needs can be addressed in the next venue of care SLP Visit Diagnosis: Dysarthria and anarthria (R47.1)     Assistance Recommended at Discharge  PRN  Functional Status Assessment    Frequency and Duration           SLP Evaluation Cognition  Overall Cognitive Status: Difficult to assess Arousal/Alertness: Awake/alert Orientation Level: Oriented to person;Oriented to place;Oriented to time;Oriented to situation Attention: Alternating Alternating Attention: Appears intact Memory:  (Pt refused to participate)       Comprehension  Auditory Comprehension Overall Auditory Comprehension: Appears within functional limits for tasks assessed    Expression Verbal Expression Overall Verbal Expression: Appears within functional limits for tasks assessed   Oral /  Motor  Oral Motor/Sensory Function Overall Oral Motor/Sensory Function: Moderate impairment Facial ROM: Reduced right;Suspected CN VII (facial) dysfunction Facial Symmetry: Abnormal symmetry  right;Suspected CN VII (facial) dysfunction Facial Strength: Reduced right;Suspected CN VII (facial) dysfunction Lingual ROM: Within Functional Limits Lingual Symmetry: Within Functional Limits Lingual Strength: Within Functional Limits Lingual Sensation: Within Functional Limits Velum: Within Functional Limits Mandible: Within Functional Limits Motor Speech Overall Motor Speech: Impaired Respiration: Within functional limits Phonation: Normal Resonance: Within functional limits Articulation: Impaired Level of Impairment: Word Intelligibility: Intelligibility reduced Word: 75-100% accurate Phrase: 75-100% accurate Sentence: 75-100% accurate Conversation: 75-100% accurate Motor Planning: Within functional limits Motor Speech Errors: Consistent            Richard Hunt, Richard Hunt 12/27/2023, 9:52 AM

## 2023-12-27 NOTE — Telephone Encounter (Signed)
 Patient Product/process development scientist completed.    The patient is insured through [employer]. Patient has Medicare and is not eligible for a copay card, but may be able to apply for patient assistance or Medicare RX Payment Plan (Patient Must reach out to their plan, if eligible for payment plan), if available.    Ran test claim for ticagrelor  (Brilinta ) 90 mg and the current 30 day co-pay is $1.60.   This test claim was processed through [facility]- copay amounts may vary at other pharmacies due to pharmacy/plan contracts, or as the patient moves through the different stages of their insurance plan.     Reyes Sharps, CPHT Pharmacy Technician III Certified Patient Advocate [facility] Patient Advocate Team Direct Number: [phone]  Fax: [phone]

## 2023-12-27 NOTE — Progress Notes (Signed)
 TR[facility]   ROUNDING   NOTE Richard Hunt FMW:[id number]  DOB: 19-Jun-1960  DOA: 12/26/2023  PCP: [facility]  12/27/2023,2:10 PM  LOS: 1 day    Code Status:  full     From:  home    63 year old Spanish-speaking male HTN HLD DM TY 2 with CKD 3B Previous lap chole 2018 Previous cellulitic wound managed in 2015 inpatient Chronic venous insufficiency Hospitalized 7/19-7/25 subacute infarct suspected embolic-MRI showed severe narrowing at different levels of R ICA MCA right PCA patient was placed on DAPT with Plavix  indefinitely after 3 months and was discharged home  9/24 present [facility] ED right sided headache facial drooping blurred vision starting on 9/21 or even longer \\Blood  pressure was uncontrolled Found to have stroke and workup ensued  Labs on admit Sodium 138 potassium 4.3 BUN/creatinine 40/2.4 LFTs normal--- LDL 50 VLDL 16 WBC 9.4 hemoglobin 86.2 platelet 248  Imaging MR brain small subacute infarcts within corona radiata parietal occipital lobe background advanced small vessel ischemic changes-chronically abnormal right ICA flow void severe stenosis demonstrated on prior imaging MR angio head stable intracranial MRA compared to 10/20/2023 severe high-grade stenosis versus occlusion involving distal V4 vertebral basilar junction patent flow within the sella distally severe bilateral P2 stenosis-mild to moderate stenosis left ICA terminus proximal M1   Assessment  & Plan :    Stroke Await decision by neurointervention and neurology regarding subacute/chronic stenosis GDMT Brilinta  etc. as per neurology--- discontinue saline when eating per neurology ?  Bell's palsy Started by neurology on Valtrex  1000 twice daily and prednisone  60 Defer to them duration etc. DMT by 2 A1c this hospital stay 5.1 indicative of very good control Continue gabapentin  100 at bedtime for neuropathy Holding Amaryl  1 and resume at discharge For now sliding scale CKD 3B Needs outpatient   nephrology follow-up-creatinine is at usual baseline HTN HLD Given reported stroke like symptoms holding all antihypertensives for now including Calone 80 losartan  100 HCTZ 25 hydralazine  100 3 times daily and Coreg  12.5-he claims compliance on all of them May need to add clonidine  if blood pressure still above 160 4 hyperlipidemia already on max dose 80 mg Lipitor  Chronic venous insufficiency Monitor only   Data Reviewed:   Sodium 137 potassium 3.9 BUN/creatinine 30/2.1 WBC 8.3 hemoglobin 11.9 platelet 223 A1c 5.1 drug screen negative  DVT prophylaxis: Brilinta /SCD  Status is: Inpatient Remains inpatient appropriate because: Await workup     Current Dispo:   Await workup     Subjective:   Still waiting on discussion with interventional radiology regarding MR imaging He does not really have any deficits although he is saying he is having somewhere chest pain He is asking for his Protonix  The symptoms have been going on for a while about a week and I reviewed his EKG which does not appear to be ACS    Objective + exam Vitals:   12/26/23 2300 12/27/23 0348 12/27/23 0808 12/27/23 1229  BP: (!) 176/83 (!) 147/68 (!) 179/85 (!) 184/100  Pulse: 77 66 65 78  Resp: 18 17 20    Temp: 98.1 F (36.7 C) 97.7 F (36.5 C) 97.9 F (36.6 C) 97.7 F (36.5 C)  TempSrc: Oral Oral Oral Oral  SpO2: 98% 100% 97% 100%  Weight:      Height:       Filed Weights   12/26/23 1900  Weight: 88.5 kg     Examination: EOMI NCAT drooping of right labial fold Power 5/5 bilaterally finger-nose-finger intact shoulder shrug intact Lower  extremity power 5/5 sensory intact ROM intact Abdomen obese nontender no rebound No icterus no pallor  Scheduled Meds:  [START ON 12/28/2023] aspirin  EC  81 mg Oral Daily   atorvastatin   80 mg Oral QHS   enoxaparin  (LOVENOX ) injection  40 mg Subcutaneous QHS   insulin  aspart  0-5 Units Subcutaneous QHS   insulin  aspart  0-9 Units Subcutaneous TID WC    pantoprazole   40 mg Oral Daily   predniSONE   60 mg Oral Q breakfast   ticagrelor   90 mg Oral BID   valACYclovir   1,000 mg Oral Q12H   Continuous Infusions:  sodium chloride  40 mL/hr at 12/26/23 2055    Time 60  Jai-Gurmukh Chrys Landgrebe, MD  [facility]

## 2023-12-27 NOTE — Progress Notes (Addendum)
 STROKE TEAM PROGRESS NOTE   INTERIM HISTORY/SUBJECTIVE Patient presented with right-sided headache, facial weakness and blurred vision and MRI demonstrated scattered right hemispheric pseudo watershed pattern infarcts with MRA showing high-grade proximal right ICA stenosis.  Patient's right facial weakness cannot be explained by MRI and appears to be a lower motor neuron pattern and may either represent Bell's palsy or small brainstem infarct not visualized on MRI  Plan to change to brilinta  for 4 weeks- copay is $1.60 Will speak with neuro IR regarding possible intervention for high-grade proximal right ICA stenosis Continue Valtrex  and prednisone  for Bell's palsy suspicion  OBJECTIVE  CBC    Component Value Date/Time   WBC 8.3 12/26/2023 2048   RBC 3.81 (L) 12/26/2023 2048   HGB 11.9 (L) 12/26/2023 2048   HCT 34.2 (L) 12/26/2023 2048   PLT 223 12/26/2023 2048   MCV 89.8 12/26/2023 2048   MCH 31.2 12/26/2023 2048   MCHC 34.8 12/26/2023 2048   RDW 12.7 12/26/2023 2048   LYMPHSABS 1.8 12/26/2023 0850   MONOABS 0.5 12/26/2023 0850   EOSABS 0.4 12/26/2023 0850   BASOSABS 0.1 12/26/2023 0850    BMET    Component Value Date/Time   NA 137 12/27/2023 0507   K 3.9 12/27/2023 0507   CL 109 12/27/2023 0507   CO2 21 (L) 12/27/2023 0507   GLUCOSE 94 12/27/2023 0507   BUN 30 (H) 12/27/2023 0507   CREATININE 2.15 (H) 12/27/2023 0507   CALCIUM  8.6 (L) 12/27/2023 0507   GFRNONAA 33 (L) 12/27/2023 0507    IMAGING past 24 hours MR ANGIO HEAD WO CONTRAST Result Date: 12/27/2023 CLINICAL DATA:  Follow-up examination for stroke. EXAM: MRA HEAD WITHOUT CONTRAST TECHNIQUE: Angiographic images of the Circle of Willis were acquired using MRA technique without intravenous contrast. COMPARISON:  Comparison made with brain MRI from 12/26/2023. FINDINGS: Anterior circulation: Irregular attenuated flow within the distal cervical right ICA with subsequent occlusion at the horizontal petrous segment.  Mild distal reconstitution at the cavernous segment via the ophthalmic artery with subsequent reocclusion at the terminus. Appearance is stable from prior. Contralateral left ICA remains patent to the terminus without stenosis or other abnormality. Left A1 segment dominant and widely patent. Irregular attenuated flow within the right A1 segment with severe stenosis at its proximal aspect (series 1031, image 10). Normal anterior communicating artery complex. Both ACAs are patent to their distal aspects without significant stenosis. Focal mild-to-moderate stenosis at the left ICA terminus/proximal left M1 segment (series 1031, image 10). Right M1 segment is markedly irregular and diminutive with moderate to severe multifocal stenoses (series 1031, image 11), relatively stable from prior. No proximal MCA branch occlusion. Right MCA branches are overall somewhat attenuated as compared to the left. Posterior circulation: Both vertebral arteries are patent as they cross into the cranial vault. Severe high-grade stenosis versus occlusion involving the distal V4 segments and vertebrobasilar junction with associated flow gap, relatively stable from prior (series 5, images 51, 56). Basilar is mildly irregular but patent distally, which may in part be retrograde in nature. Superior cerebral arteries patent bilaterally. Both PCAs primarily supplied via the basilar. Atheromatous change about the PCAs with associated severe bilateral P2 stenoses (series 1013, image 4). PCAs remain patent to their distal aspects, with the left slightly attenuated as compared to the right. Anatomic variants: As above. Other: No intracranial aneurysm. Overall, appearance of the intracranial circulation is relatively stable from prior MRA from 10/20/2023. Neck soft IMPRESSION: 1. Stable intracranial MRA as compared to 10/20/2023. 2.  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 Short-segment distal reconstitution at the cavernous  segment via the ophthalmic artery with subsequent reocclusion at the right ICA terminus. 3. Moderate to severe multifocal stenoses involving the right A1 and M1 segments. 4. Severe high-grade stenosis versus occlusion involving the distal V4 segments and vertebrobasilar junction. Patent flow within the basilar distally, which may in part be retrograde in nature. 5. Severe bilateral P2 stenoses. 6. Mild-to-moderate stenosis at the left ICA terminus/proximal left M1 segment. Electronically Signed   By: Morene Hoard M.D.   On: 12/27/2023 02:40   MR Brain Wo Contrast (neuro protocol) Result Date: 12/26/2023 CLINICAL DATA:  Provided history: Neuro deficit, acute, stroke suspected. Additional history provided: Right-sided headache, facial droop, blurred vision. EXAM: MRI HEAD WITHOUT CONTRAST TECHNIQUE: Multiplanar, multiecho pulse sequences of the brain and surrounding structures were obtained without intravenous contrast. COMPARISON:  Brain MRI 10/19/2023.  MRA head 10/20/2023. FINDINGS: Brain: No age-advanced or lobar predominant cerebral atrophy. There are a few small foci of diffusion-weighted signal abnormality within the parietal cortex, occipital cortex, occipital white matter and corona radiata on the right consistent with acute/subacute infarcts (measuring up to 5 mm). Chronic lacunar infarcts within the bilateral cerebral hemispheric white matter, within the callosal splenium on the left, within/about the bilateral deep gray nuclei and within the right middle cerebellar peduncle/cerebellar white matter. Moderate multifocal T2 FLAIR hyperintense signal abnormality elsewhere within the cerebral white matter, nonspecific but compatible with chronic small vessel ischemic disease. Few nonspecific chronic microhemorrhages scattered within the supratentorial brain. No evidence of an intracranial mass. No extra-axial fluid collection. No midline shift. Vascular: Chronically abnormal right ICA flow void. Flow  voids preserved elsewhere within the proximal large arterial vessels. Skull and upper cervical spine: No focal worrisome marrow lesion. Sinuses/Orbits: No mass or acute finding within the imaged orbits. No significant paranasal sinus disease. Other: Small-volume fluid within bilateral mastoid air cells. IMPRESSION: 1. Few small acute/subacute infarcts within the corona radiata, parietal lobe and occipital lobe on the right (measuring up to 5 mm). 2. Background age-advanced chronic small vessel ischemic disease with multiple chronic lacunar infarcts, as described. 3. Chronically abnormal right internal carotid artery flow void. Sites of severe stenosis and occlusion were demonstrated within this vessel on the prior MRA head/neck of 10/20/2023. Please refer to these prior examinations for further description. 4. Small-volume fluid within bilateral mastoid air cells. Electronically Signed   By: Rockey Childs D.O.   On: 12/26/2023 12:53    Vitals:   12/26/23 1950 12/26/23 2300 12/27/23 0348 12/27/23 0808  BP: (!) 213/92 (!) 176/83 (!) 147/68 (!) 179/85  Pulse: 71 77 66 65  Resp: 16 18 17 20   Temp: 97.8 F (36.6 C) 98.1 F (36.7 C) 97.7 F (36.5 C) 97.9 F (36.6 C)  TempSrc: Oral Oral Oral Oral  SpO2: 99% 98% 100% 97%  Weight:      Height:         PHYSICAL EXAM General:  Alert, well-nourished, well-developed patient in no acute distress Psych:  Mood and affect appropriate for situation CV: Regular rate and rhythm on monitor Respiratory:  Regular, unlabored respirations on room air GI: Abdomen soft and nontender   NEURO:  Mental Status: AA&Ox3, patient is able to give clear and coherent history Speech/Language: speech is without dysarthria or aphasia.  Naming, repetition, fluency, and comprehension intact.  Cranial Nerves:  II: PERRL. Visual fields full.  III, IV, VI: EOMI. Eyelids elevate symmetrically.  V: Sensation is  intact to light touch and symmetrical to face.  VII: Right facial  asymmetry  VIII: hearing intact to voice. IX, X: Palate elevates symmetrically. Phonation is normal.  KP:Dynloizm shrug 5/5. XII: tongue is midline without fasciculations. Motor: 5/5 strength to all muscle groups tested.  Tone: is normal and bulk is normal Sensation- Intact to light touch bilaterally. Extinction absent to light touch to DSS.   Coordination: FTN intact bilaterally, HKS: no ataxia in BLE.No drift.  Gait- deferred  Most Recent NIH 1     ASSESSMENT/PLAN  [name] is a 63 y.o. 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  NIH on Admission 3  Acute Ischemic Infarct:  right hemispheric infarcts clinically silent Etiology: High-grade proximal right ICA   stenosis and petrous right ICA occlusion MRI  Few small acute/subacute infarcts within the corona radiata, parietal lobe and occipital lobe on the right (measuring up to 5 mm). Background age-advanced chronic small vessel ischemic disease with multiple chronic lacunar infarcts, as described. MRA  Stable intracranial MRA as compared to 10/20/2023. 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 Short-segment distal reconstitution at the cavernous segment via the ophthalmic artery with subsequent reocclusion at the right ICA terminus. Moderate to severe multifocal stenoses involving the right A1 and M1 segments. Severe high-grade stenosis versus occlusion involving the distal V4 segments and vertebrobasilar junction. Patent flow within the basilar distally, which may in part be retrograde in nature. Severe bilateral P2 stenoses. Mild-to-moderate stenosis at the left ICA terminus/proximal left M1 segment. Carotid Doppler Pending TCD Bubble  10/2023- 2D Echo EF 60-65%, left atria dilated  LDL 50 HgbA1c 5.1 VTE prophylaxis - Lovenox  aspirin  81 mg daily  and clopidogrel  75 mg daily prior to admission, now on aspirin  81 mg daily and Brilinta  (ticagrelor ) 90 mg bid for 4 weeks and then aspirin  alone. Therapy recommendations:  Outpatient PT/OT/ST Disposition:  Pending   Hx of Stroke/TIA Stroke in July 2025 Patchy small acute/subacute infarcts scattered within the callosal splenium on the left, and within the bilateral parietooccipital lobes. DAPT with ASA and plavix , 90-day course, followed by Plavix  monotherapy going forward, with patient counseled to switch to -aspirin  monotherapy if insurance/medication accessibility issues arise  MR Angio head Severe narrowing of the right ICA at its origin with poststenotic dilatation. The right ICA terminus appears occluded past the ophthalmic segment.  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  Severe stenosis of the right PCA P2 segment. 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 Severe, near complete stenosis of the distal left vertebral artery. Will discuss right ICA with our neurointerventionalist  Hypertension Home meds:  Cozaar , verapamil , hydralazine , coreg  Stable Blood Pressure Goal: BP less than 220/110   Hyperlipidemia Home meds:  atorvastatin  80mg , resumed in hospital LDL 50, goal < 70 Continue statin at discharge  Headache Concern for Bell's Palsy  Neuro exam with R bells palsy that is not well explained by the strokes noted on MRI.  Valtrex  and prednisone   Hospital day # 1  Patient seen and examined by NP/APP with MD. MD to update note as needed.   Jorene Last, DNP, FNP-BC [facility] Pager: [phone]  I have personally obtained history,examined this patient, reviewed notes, independently viewed imaging studies, participated  in medical decision making and plan of care.ROS completed by me personally and pertinent positives fully documented  I have made any additions or clarifications  directly to the above note. Agree with note above.  Patient with significant multivessel extracranial intracranial atherosclerosis presented with right facial droop and headache.  The facial weakness pattern appears to be lower motor neuron and may represent Bell's palsy or a small brainstem infarct not visualized on MRI.  Brain MRI also shows multiple right hemispheric infarcts as well as recent left hemispheric infarcts from July 2025 due to severe intra and extracranial vascular stenosis.  We need to consider doing diagnostic cerebral catheter angiogram to see if she has any endovascularly treatable lesion given his poor renal function this may be risky.  Plan to discuss case with Dr. Todd Burns neuro interventional radiologist to plan further evaluation.  Recommend change aspirin  Plavix  to aspirin  and Brilinta  dual antiplatelet therapy and maintain aggressive risk factor modification.  Continue prednisone  and Valtrex  for presumed Bell's palsy.  Discussion with patient and answered questions.  Discussed with Dr. Royal and Dr. Burns   I personally spent a total of 50 minutes in the care of the patient today including getting/reviewing separately obtained history, performing a medically appropriate exam/evaluation, counseling and educating, placing orders, referring and communicating with other health care professionals, documenting clinical information in the EHR, independently interpreting results, and coordinating care.        Eather Popp, MD Medical Director [facility] Pager: [phone] 12/27/2023 3:41 PM  To contact Stroke Continuity provider, please refer to [email or web address]. After hours, contact General Neurology

## 2023-12-27 NOTE — Evaluation (Signed)
 Physical Therapy Evaluation Patient Details Name: Richard Hunt MRN: [id number] DOB: [birth year] Today's Date: 12/27/2023  History of Present Illness  63 y.o. male presents to [facility] 12/26/23 from [facility] ED with R sided headache, facial droop and blurred vision and found to have scattered small R hemispheric strokes. PMHx:  HTN, HLD, DM2, CKD 3b, prior stroke in July of this year, GERD, R ICA stenosis noted on MRA in July 2025   Clinical Impression  Pt in bed upon arrival and agreeable to PT eval. PTA pt was ModI with SP cane with history of bilateral knee pain. Pt presents at functional mobility baseline with ability to ambulate 291ft ModI with IV pole and negotiate 5 steps with bilateral handrails. Pt has 24/7 level of assist available at home. Recommended OP PT to address knee pain, however, pt declines at this time. Educated pt on BE FAST acronym with pt verbalizing understanding. Pt would benefit from acute skilled PT with current functional limitations listed below (see PT Problem List). Acute PT to follow.         If plan is discharge home, recommend the following: Help with stairs or ramp for entrance   Can travel by private vehicle    Yes    Equipment Recommendations None recommended by PT     Functional Status Assessment Patient has not had a recent decline in their functional status     Precautions / Restrictions Precautions Precautions: Fall Recall of Precautions/Restrictions: Intact Restrictions Weight Bearing Restrictions Per Provider Order: No      Mobility  Bed Mobility Overal bed mobility: Independent     Transfers Overall transfer level: Independent Equipment used: None     Ambulation/Gait Ambulation/Gait assistance: Modified independent (Device/Increase time) Gait Distance (Feet): 300 Feet Assistive device: IV Pole Gait Pattern/deviations: Step-through pattern, Decreased stride length Gait velocity: decr     General Gait Details: increased  medial/lateral sway with use of IV pole to mimic SP cane  Stairs Stairs: Yes Stairs assistance: Modified independent (Device/Increase time) Stair Management: One rail Right, One rail Left, Step to pattern, Forwards Number of Stairs: 5 General stair comments: ModI with use of bilateral handrails and step to pattern  Modified Rankin (Stroke Patients Only) Modified Rankin (Stroke Patients Only) Pre-Morbid Rankin Score: No symptoms Modified Rankin: No symptoms     Balance Overall balance assessment: Independent        Pertinent Vitals/Pain Pain Assessment Pain Assessment: Faces Faces Pain Scale: Hurts little more Pain Location: h/a Pain Descriptors / Indicators: Aching, Discomfort Pain Intervention(s): Limited activity within patient's tolerance, Monitored during session, Repositioned    Home Living Family/patient expects to be discharged to:: Private residence Living Arrangements: Children;Spouse/significant other Available Help at Discharge: Family;Available 24 hours/day Type of Home: Mobile home Home Access: Stairs to enter Entrance Stairs-Rails: Right;Left;Can reach both Entrance Stairs-Number of Steps: 3   Home Layout: One level Home Equipment: Cane - single point      Prior Function Prior Level of Function : Independent/Modified Independent    Mobility Comments: ModI with SP cane       Extremity/Trunk Assessment   Upper Extremity Assessment Upper Extremity Assessment: Defer to OT evaluation    Lower Extremity Assessment Lower Extremity Assessment: Overall WFL for tasks assessed (pain in B knees at rest and with movement)    Cervical / Trunk Assessment Cervical / Trunk Assessment: Normal  Communication   Communication Communication: No apparent difficulties Factors Affecting Communication: Non - English speaking, interpreter not available (prefers no interpreter)  Cognition Arousal: Alert Behavior During Therapy: WFL for tasks assessed/performed    PT - Cognitive impairments: No apparent impairments    Following commands: Intact       Cueing Cueing Techniques: Verbal cues     General Comments General comments (skin integrity, edema, etc.): VSS on RA     PT Assessment Patient does not need any further PT services         PT Goals (Current goals can be found in the Care Plan section)  Acute Rehab PT Goals PT Goal Formulation: All assessment and education complete, DC therapy     AM-PAC PT 6 Clicks Mobility  Outcome Measure Help needed turning from your back to your side while in a flat bed without using bedrails?: None Help needed moving from lying on your back to sitting on the side of a flat bed without using bedrails?: None Help needed moving to and from a bed to a chair (including a wheelchair)?: None Help needed standing up from a chair using your arms (e.g., wheelchair or bedside chair)?: None Help needed to walk in hospital room?: None Help needed climbing 3-5 steps with a railing? : None 6 Click Score: 24    End of Session Equipment Utilized During Treatment: Gait belt Activity Tolerance: Patient tolerated treatment well Patient left: in bed;with call bell/phone within reach Nurse Communication: Mobility status PT Visit Diagnosis: Other abnormalities of gait and mobility (R26.89)    Time: 8995-8976 PT Time Calculation (min) (ACUTE ONLY): 19 min   Charges:   PT Evaluation $PT Eval Low Complexity: 1 Low   PT General Charges $$ ACUTE PT VISIT: 1 Visit       Kate ORN, PT, DPT Secure Chat Preferred  Rehab Office [phone]   Kate BRAVO Wendolyn 12/27/2023, 10:30 AM

## 2023-12-27 NOTE — Plan of Care (Signed)
 progressing

## 2023-12-27 NOTE — Progress Notes (Signed)
  Echocardiogram 2D Echocardiogram has been performed.  Richard Hunt 12/27/2023, 2:36 PM

## 2023-12-27 NOTE — TOC CAGE-AID Note (Signed)
 Transition of Care [facility]) - CAGE-AID Screening   Patient Details  Name: Richard Hunt MRN: [id number] Date of Birth: 1960/08/21  Transition of Care [facility]) CM/SW Contact:    Tennyson Kallen E Mellody Masri, LCSW Phone Number: 12/27/2023, 10:33 AM   Clinical Narrative: No SA noted.    CAGE-AID Screening:    Have You Ever Felt You Ought to Cut Down on Your Drinking or Drug Use?: No Have People Annoyed You By Critizing Your Drinking Or Drug Use?: No Have You Felt Bad Or Guilty About Your Drinking Or Drug Use?: No Have You Ever Had a Drink or Used Drugs First Thing In The Morning to Steady Your Nerves or to Get Rid of a Hangover?: No CAGE-AID Score: 0  Substance Abuse Education Offered: No

## 2023-12-27 NOTE — TOC Initial Note (Signed)
 Transition of Care [facility]) - Initial/Assessment Note    Patient Details  Name: Richard Hunt MRN: [id number] Date of Birth: March 24, 1961  Transition of Care [facility]) CM/SW Contact:    Andrez JULIANNA George, RN Phone Number: 12/27/2023, 3:07 PM  Clinical Narrative:                  Richard Hunt is a 63 y.o. male with medical history significant of HTN, HLD, DM2, CKD stage IIIb, chronic venous insufficiency, ischemic CVA, right ICA stenosis, GERD who presented to [facility] ED on 12/26/2023 with complaints of right-sided headache with associated facial drooping and blurred vision.   Pt is from home with spouse and children. He has needed support at home.  Pt drives but spouse can assist with transportation.  Pt manages his own medications at home.   PT recommending outpatient therapy but pt declines.   IP Care management following.  Expected Discharge Plan: Home/Self Care Barriers to Discharge: Continued Medical Work up   Patient Goals and CMS Choice            Expected Discharge Plan and Services       Living arrangements for the past 2 months: Mobile Home                                      Prior Living Arrangements/Services Living arrangements for the past 2 months: Mobile Home Lives with:: Spouse Patient language and need for interpreter reviewed:: Yes Do you feel safe going back to the place where you live?: Yes        Care giver support system in place?: Yes (comment)   Criminal Activity/Legal Involvement Pertinent to Current Situation/Hospitalization: No - Comment as needed  Activities of Daily Living   ADL Screening (condition at time of admission) Independently performs ADLs?: Yes (appropriate for developmental age)  Permission Sought/Granted                  Emotional Assessment Appearance:: Appears stated age Attitude/Demeanor/Rapport: Engaged Affect (typically observed): Accepting Orientation: : Oriented to Self,  Oriented to Place, Oriented to  Time, Oriented to Situation   Psych Involvement: No (comment)  Admission diagnosis:  Acute CVA (cerebrovascular accident) [facility]) [I63.9] Cerebrovascular accident (CVA), unspecified mechanism (HCC) [I63.9] Patient Active Problem List   Diagnosis Date Noted   Acute CVA (cerebrovascular accident) (HCC) 12/26/2023   CVA (cerebral vascular accident) (HCC) 10/20/2023   GERD (gastroesophageal reflux disease) 10/20/2023   Acute cholecystitis 12/11/2016   Diabetes (HCC) 12/11/2016   HTN (hypertension) 12/11/2016   HLD (hyperlipidemia) 12/11/2016   Chronic venous insufficiency 01/16/2014   Bilateral edema of lower extremity 01/02/2014   Ulcer of calf (HCC) 08/29/2013   PCP:  Cletus Glenn Pharmacy:   [facility] [phone] [place], [place] - [address] AT [facility] OF [address] [place] [address] [place] [address] Phone: [phone] Fax: [phone]     Social Drivers of Health (SDOH) Social History: SDOH Screenings   Food Insecurity: No Food Insecurity (12/26/2023)  Recent Concern: Food Insecurity - Food Insecurity Present (10/09/2023)   Received from [facility]  Housing: Low Risk  (12/26/2023)  Transportation Needs: No Transportation Needs (12/26/2023)  Utilities: Not At Risk (12/26/2023)  Recent Concern: Utilities - At Risk (10/09/2023)   Received from [facility]  Financial Resource Strain: Medium Risk (10/09/2023)  Received from [facility]  Social Connections: Socially Integrated (12/26/2023)  Tobacco Use: Low Risk  (12/26/2023)  Recent Concern: Tobacco Use - Medium Risk (11/19/2023)   Received from [facility]   SDOH Interventions:     Readmission Risk Interventions     No data to display

## 2023-12-27 NOTE — Evaluation (Signed)
 Occupational Therapy Evaluation Patient Details Name: Richard Hunt MRN: [id number] DOB: Jun 19, 1960 Today's Date: 12/27/2023   History of Present Illness   63 y.o. male presents [facility] 12/26/23 from [facility] ED with R sided headache, facial droop and blurred vision and found to have scattered small R hemispheric strokes. PMHx:  HTN, HLD, DM2, CKD 3b, prior stroke in July of this year, GERD, R ICA stenosis noted on MRA in July 2025     Clinical Impressions Pt greeted seated EOB, eating lunch indep upon OT arrival. PTA, pt was living with his family and was reportedly indep with ADLs, IADLs, and driving. Wife assists with medication mgmt via pillbox. Needs updated reading glasses rx. Today, pt presents with intact strength, coordination, and sensation in BUE. Mild reduction in L peripheral vision (pt endorsing blurriness) and some deficits in short-term recall. Functionally, he was no more than mod I for UE/LE ADLs and mobility without AD. Educated pt on assistance with driving & medication mgmt at this time- he is agreeable to this.   Pt is functioning at a level that is adequate for d/c from an OT standpoint, no further acute OT services warranted at this time.     If plan is discharge home, recommend the following:   Assist for transportation (intermittent assistance with medication management)     Functional Status Assessment         Equipment Recommendations   None recommended by OT     Recommendations for Other Services         Precautions/Restrictions   Precautions Precautions: Fall Recall of Precautions/Restrictions: Intact Restrictions Weight Bearing Restrictions Per Provider Order: No     Mobility Bed Mobility Overal bed mobility: Independent                  Transfers Overall transfer level: Independent Equipment used: None               General transfer comment: Intermittently reaching for hand rail in hallway, pt endorsing this  is his baseline 2/2 bad knees      Balance Overall balance assessment: Modified Independent                                         ADL either performed or assessed with clinical judgement   ADL Overall ADL's : Modified independent                                       General ADL Comments: performed LE dressing and simulated grooming with mod I, and observed pt indep feeding self upon OT arrival     Vision Baseline Vision/History: 1 Wears glasses (readers - reports he needs a new Rx) Ability to See in Adequate Light: 0 Adequate Patient Visual Report: Blurring of vision Vision Assessment?: No apparent visual deficits Additional Comments: mild reduction in L lower periphery ~10*     Perception Perception: Within Functional Limits       Praxis         Pertinent Vitals/Pain Pain Assessment Pain Assessment: 0-10 Pain Score: 5  Pain Location: HA Pain Descriptors / Indicators: Aching Pain Intervention(s): Limited activity within patient's tolerance, Monitored during session, Repositioned     Extremity/Trunk Assessment Upper Extremity Assessment Upper Extremity Assessment: Overall WFL for tasks assessed   Lower  Extremity Assessment Lower Extremity Assessment: Defer to PT evaluation   Cervical / Trunk Assessment Cervical / Trunk Assessment: Normal   Communication Communication Communication: No apparent difficulties Factors Affecting Communication: Non - English speaking, interpreter not available (declined interpreter)   Cognition Arousal: Alert Behavior During Therapy: WFL for tasks assessed/performed Cognition: Cognition impaired     Awareness: Intellectual awareness intact, Online awareness intact Memory impairment (select all impairments): Short-term memory, Working memory Attention impairment (select first level of impairment): Divided attention   OT - Cognition Comments: mild deficits in recall                  Following commands: Intact       Cueing  General Comments   Cueing Techniques: Verbal cues  pleasant & participatory; supportive wife present towards end of session   Exercises     Shoulder Instructions      Home Living Family/patient expects to be discharged to:: Private residence Living Arrangements: Children;Spouse/significant other Available Help at Discharge: Family;Available 24 hours/day Type of Home: Mobile home Home Access: Stairs to enter Entrance Stairs-Number of Steps: 3 Entrance Stairs-Rails: Right;Left;Can reach both Home Layout: One level     Bathroom Shower/Tub: Producer, television/film/video: Standard     Home Equipment: [employer] - single point   Additional Comments: Used SPC PRN PTA  Lives With: Family    Prior Functioning/Environment Prior Level of Function : Independent/Modified Independent;Driving             Mobility Comments: ModI with SP cane ADLs Comments: wife assists with medication mgmt via pillbox, does yardwork & is rather active    OT Problem List: Impaired balance (sitting and/or standing);Pain   OT Treatment/Interventions:        OT Goals(Current goals can be found in the care plan section)   Acute Rehab OT Goals Patient Stated Goal: to fix my artery OT Goal Formulation: With patient   OT Frequency:       Co-evaluation              AM-PAC OT 6 Clicks Daily Activity     Outcome Measure Help from another person eating meals?: None Help from another person taking care of personal grooming?: None Help from another person toileting, which includes using toliet, bedpan, or urinal?: None Help from another person bathing (including washing, rinsing, drying)?: A Little Help from another person to put on and taking off regular upper body clothing?: None Help from another person to put on and taking off regular lower body clothing?: None 6 Click Score: 23   End of Session    Activity Tolerance: Patient  tolerated treatment well Patient left: in bed;with family/visitor present;with call bell/phone within reach (seated EOB)  OT Visit Diagnosis: Unsteadiness on feet (R26.81)                Time: 8764-8744 OT Time Calculation (min): 20 min Charges:  OT General Charges $OT Visit: 1 Visit OT Evaluation $OT Eval Low Complexity: 1 Low OT Treatments $Self Care/Home Management : 8-22 mins  Sindi Beckworth D., MSOT, OTR/L Acute Rehabilitation Services [phone] Secure Chat Preferred  Rikki Milch 12/27/2023, 2:44 PM

## 2023-12-28 DIAGNOSIS — G589 Mononeuropathy, unspecified: Secondary | ICD-10-CM

## 2023-12-28 DIAGNOSIS — I679 Cerebrovascular disease, unspecified: Secondary | ICD-10-CM

## 2023-12-28 DIAGNOSIS — G510 Bell's palsy: Secondary | ICD-10-CM | POA: Diagnosis not present

## 2023-12-28 DIAGNOSIS — I10 Essential (primary) hypertension: Secondary | ICD-10-CM

## 2023-12-28 DIAGNOSIS — R29701 NIHSS score 1: Secondary | ICD-10-CM | POA: Diagnosis not present

## 2023-12-28 DIAGNOSIS — I7090 Unspecified atherosclerosis: Secondary | ICD-10-CM | POA: Diagnosis not present

## 2023-12-28 DIAGNOSIS — I639 Cerebral infarction, unspecified: Secondary | ICD-10-CM | POA: Diagnosis not present

## 2023-12-28 DIAGNOSIS — I63231 Cerebral infarction due to unspecified occlusion or stenosis of right carotid arteries: Secondary | ICD-10-CM | POA: Diagnosis not present

## 2023-12-28 LAB — BASIC METABOLIC PANEL WITH GFR
Anion gap: 11 (ref 5–15)
BUN: 31 mg/dL — ABNORMAL HIGH (ref 8–23)
CO2: 20 mmol/L — ABNORMAL LOW (ref 22–32)
Calcium: 8.6 mg/dL — ABNORMAL LOW (ref 8.9–10.3)
Chloride: 106 mmol/L (ref 98–111)
Creatinine, Ser: 2.25 mg/dL — ABNORMAL HIGH (ref 0.61–1.24)
GFR, Estimated: 32 mL/min — ABNORMAL LOW (ref >60)
Glucose, Bld: 91 mg/dL (ref 70–99)
Potassium: 3.6 mmol/L (ref 3.5–5.1)
Sodium: 137 mmol/L (ref 135–145)

## 2023-12-28 LAB — GLUCOSE, CAPILLARY
Glucose-Capillary: 97 mg/dL (ref 70–99)
Glucose-Capillary: 138 mg/dL — ABNORMAL HIGH (ref 70–99)
Glucose-Capillary: 206 mg/dL — ABNORMAL HIGH (ref 70–99)
Glucose-Capillary: 219 mg/dL — ABNORMAL HIGH (ref 70–99)

## 2023-12-28 MED ORDER — CLONIDINE HCL 0.1 MG PO TABS
0.2000 mg | ORAL_TABLET | Freq: Two times a day (BID) | ORAL | Status: DC
  Administered 2023-12-28: 0.2 mg via ORAL
  Filled 2023-12-28: qty 2

## 2023-12-28 MED ORDER — VERAPAMIL HCL 40 MG PO TABS
80.0000 mg | ORAL_TABLET | Freq: Three times a day (TID) | ORAL | Status: DC
  Administered 2023-12-28 – 2023-12-29 (×4): 80 mg via ORAL
  Filled 2023-12-28: qty 2
  Filled 2023-12-28: qty 1
  Filled 2023-12-28: qty 2
  Filled 2023-12-28: qty 1
  Filled 2023-12-28 (×4): qty 2
  Filled 2023-12-28: qty 1

## 2023-12-28 MED ORDER — VERAPAMIL HCL 80 MG PO TABS: 80.0000 mg | ORAL_TABLET | Freq: Three times a day (TID) | ORAL | Status: DC

## 2023-12-28 MED ORDER — GABAPENTIN 100 MG PO CAPS
100.0000 mg | ORAL_CAPSULE | Freq: Every day | ORAL | Status: DC
Start: 2023-12-28 — End: 2023-12-29
  Administered 2023-12-28: 100 mg via ORAL
  Filled 2023-12-28: qty 1

## 2023-12-28 MED ORDER — HYDRALAZINE HCL 50 MG PO TABS
150.0000 mg | ORAL_TABLET | Freq: Three times a day (TID) | ORAL | Status: DC
Start: 2023-12-28 — End: 2023-12-29
  Administered 2023-12-28 – 2023-12-29 (×4): 150 mg via ORAL
  Filled 2023-12-28 (×4): qty 3

## 2023-12-28 MED ORDER — CARVEDILOL 12.5 MG PO TABS
12.5000 mg | ORAL_TABLET | Freq: Every morning | ORAL | Status: DC
  Administered 2023-12-28 – 2023-12-29 (×2): 12.5 mg via ORAL
  Filled 2023-12-28 (×2): qty 1

## 2023-12-28 MED ORDER — HYDROCHLOROTHIAZIDE 25 MG PO TABS: 25.0000 mg | ORAL_TABLET | Freq: Every day | ORAL | Status: DC

## 2023-12-28 MED ORDER — CLONIDINE HCL 0.1 MG PO TABS: 0.1000 mg | ORAL_TABLET | Freq: Every day | ORAL | Status: DC | PRN

## 2023-12-28 MED ORDER — GLIMEPIRIDE 1 MG PO TABS
1.0000 mg | ORAL_TABLET | Freq: Every day | ORAL | Status: DC
Start: 2023-12-29 — End: 2023-12-29
  Administered 2023-12-29: 1 mg via ORAL
  Filled 2023-12-28: qty 1

## 2023-12-28 MED ORDER — LOSARTAN POTASSIUM 50 MG PO TABS
100.0000 mg | ORAL_TABLET | Freq: Every day | ORAL | Status: DC
  Administered 2023-12-28: 100 mg via ORAL
  Filled 2023-12-28 (×2): qty 2

## 2023-12-28 MED ORDER — LOSARTAN POTASSIUM 50 MG PO TABS: 100.0000 mg | ORAL_TABLET | Freq: Every day | ORAL | Status: DC

## 2023-12-28 MED ORDER — HYDROCHLOROTHIAZIDE 25 MG PO TABS
25.0000 mg | ORAL_TABLET | Freq: Every day | ORAL | Status: DC
Start: 2023-12-28 — End: 2023-12-29
  Administered 2023-12-28 – 2023-12-29 (×2): 25 mg via ORAL
  Filled 2023-12-28 (×2): qty 1

## 2023-12-28 NOTE — Plan of Care (Signed)

## 2023-12-28 NOTE — Progress Notes (Signed)
 STROKE TEAM PROGRESS NOTE   INTERIM HISTORY/SUBJECTIVE Patient is sitting up in bed.  His wife is at the bedside.  Continues to have right facial weakness.  Used Spanish language video interpreter and discussed patient's prognosis with him and answered questions.  Discussed with Dr. Lester who agrees patient is not a candidate for any endovascular treatment or surgical revascularization at this time.  Went on to OBJECTIVE  CBC    Component Value Date/Time   WBC 8.3 12/26/2023 2048   RBC 3.81 (L) 12/26/2023 2048   HGB 11.9 (L) 12/26/2023 2048   HCT 34.2 (L) 12/26/2023 2048   PLT 223 12/26/2023 2048   MCV 89.8 12/26/2023 2048   MCH 31.2 12/26/2023 2048   MCHC 34.8 12/26/2023 2048   RDW 12.7 12/26/2023 2048   LYMPHSABS 1.8 12/26/2023 0850   MONOABS 0.5 12/26/2023 0850   EOSABS 0.4 12/26/2023 0850   BASOSABS 0.1 12/26/2023 0850    BMET    Component Value Date/Time   NA 137 12/28/2023 0155   K 3.6 12/28/2023 0155   CL 106 12/28/2023 0155   CO2 20 (L) 12/28/2023 0155   GLUCOSE 91 12/28/2023 0155   BUN 31 (H) 12/28/2023 0155   CREATININE 2.25 (H) 12/28/2023 0155   CALCIUM  8.6 (L) 12/28/2023 0155   GFRNONAA 32 (L) 12/28/2023 0155    IMAGING past 24 hours ECHOCARDIOGRAM COMPLETE Result Date: 12/27/2023    ECHOCARDIOGRAM REPORT   Patient Name:   Richard Hunt Date of Exam: 12/27/2023 Medical Rec #:  [id number]               Height:       65.0 in Accession #:    [id number]              Weight:       195.0 lb Date of Birth:  11-13-1960               BSA:          1.957 m Patient Age:    63 years                BP:           179/85 mmHg Patient Gender: M                       HR:           76 bpm. Exam Location:  Inpatient Procedure: 2D Echo, Cardiac Doppler and Color Doppler (Both Spectral and Color            Flow Doppler were utilized during procedure). Indications:    Stroke  History:        Patient has prior history of Echocardiogram examinations, most                 recent  10/21/2023. Stroke; Risk Factors:Hypertension and                 Dyslipidemia.  Sonographer:    Juliene Rucks Referring Phys: ERIC J [place] IMPRESSIONS  1. Left ventricular ejection fraction, by estimation, is 55%. Left ventricular ejection fraction by 2D MOD biplane is 58.7 %. The left ventricle has normal function. The left ventricle has no regional wall motion abnormalities. Left ventricular diastolic parameters are consistent with Grade I diastolic dysfunction (impaired relaxation).  2. Right ventricular systolic function is normal. The right ventricular size is normal. Tricuspid regurgitation signal is inadequate for assessing PA pressure.  3.  The mitral valve is normal in structure. Mild mitral valve regurgitation. No evidence of mitral stenosis.  4. The aortic valve is tricuspid. There is mild calcification of the aortic valve. Aortic valve regurgitation is not visualized. No aortic stenosis is present.  5. The inferior vena cava is normal in size with greater than 50% respiratory variability, suggesting right atrial pressure of 3 mmHg. FINDINGS  Left Ventricle: Left ventricular ejection fraction, by estimation, is 55%. Left ventricular ejection fraction by 2D MOD biplane is 58.7 %. The left ventricle has normal function. The left ventricle has no regional wall motion abnormalities. The left ventricular internal cavity size was normal in size. There is no left ventricular hypertrophy. Left ventricular diastolic parameters are consistent with Grade I diastolic dysfunction (impaired relaxation). Right Ventricle: The right ventricular size is normal. No increase in right ventricular wall thickness. Right ventricular systolic function is normal. Tricuspid regurgitation signal is inadequate for assessing PA pressure. Left Atrium: Left atrial size was normal in size. Right Atrium: Right atrial size was normal in size. Pericardium: There is no evidence of pericardial effusion. Mitral Valve: The mitral valve is  normal in structure. Mild mitral valve regurgitation. No evidence of mitral valve stenosis. Tricuspid Valve: The tricuspid valve is normal in structure. Tricuspid valve regurgitation is not demonstrated. Aortic Valve: The aortic valve is tricuspid. There is mild calcification of the aortic valve. Aortic valve regurgitation is not visualized. No aortic stenosis is present. Aortic valve mean gradient measures 6.0 mmHg. Aortic valve peak gradient measures 11.4 mmHg. Aortic valve area, by VTI measures 2.13 cm. Pulmonic Valve: The pulmonic valve was normal in structure. Pulmonic valve regurgitation is not visualized. Aorta: The aortic root is normal in size and structure. Venous: The inferior vena cava is normal in size with greater than 50% respiratory variability, suggesting right atrial pressure of 3 mmHg. IAS/Shunts: No atrial level shunt detected by color flow Doppler.  LEFT VENTRICLE PLAX 2D                        Biplane EF (MOD) LVIDd:         6.10 cm         LV Biplane EF:   Left LVIDs:         4.10 cm                          ventricular LV PW:         1.20 cm                          ejection LV IVS:        1.10 cm                          fraction by LVOT diam:     2.00 cm                          2D MOD LV SV:         74                               biplane is LV SV Index:   38  58.7 %. LVOT Area:     3.14 cm                                Diastology                                LV e' medial:    4.03 cm/s LV Volumes (MOD)               LV E/e' medial:  16.0 LV vol d, MOD    138.0 ml      LV e' lateral:   7.72 cm/s A2C:                           LV E/e' lateral: 8.4 LV vol d, MOD    145.0 ml A4C: LV vol s, MOD    67.2 ml A2C: LV vol s, MOD    54.5 ml A4C: LV SV MOD A2C:   70.8 ml LV SV MOD A4C:   145.0 ml LV SV MOD BP:    84.5 ml RIGHT VENTRICLE             IVC RV Basal diam:  3.30 cm     IVC diam: 1.30 cm RV Mid diam:    2.30 cm RV S prime:     17.30 cm/s TAPSE (M-mode): 2.3  cm LEFT ATRIUM             Index        RIGHT ATRIUM           Index LA diam:        4.30 cm 2.20 cm/m   RA Area:     16.50 cm LA Vol (A2C):   83.9 ml 42.87 ml/m  RA Volume:   47.00 ml  24.02 ml/m LA Vol (A4C):   62.9 ml 32.14 ml/m LA Biplane Vol: 76.9 ml 39.30 ml/m  AORTIC VALVE AV Area (Vmax):    2.16 cm AV Area (Vmean):   2.16 cm AV Area (VTI):     2.13 cm AV Vmax:           169.00 cm/s AV Vmean:          112.000 cm/s AV VTI:            0.346 m AV Peak Grad:      11.4 mmHg AV Mean Grad:      6.0 mmHg LVOT Vmax:         116.00 cm/s LVOT Vmean:        77.000 cm/s LVOT VTI:          0.235 m LVOT/AV VTI ratio: 0.68  AORTA Ao Root diam: 3.40 cm Ao Asc diam:  3.00 cm MITRAL VALVE MV Area (PHT): 3.42 cm     SHUNTS MV Decel Time: 222 msec     Systemic VTI:  0.24 m MR Peak grad: 45.4 mmHg     Systemic Diam: 2.00 cm MR Vmax:      337.00 cm/s MV E velocity: 64.50 cm/s MV A velocity: 104.00 cm/s MV E/A ratio:  0.62 Dalton McleanMD Electronically signed by Ezra Kanner Signature Date/Time: 12/27/2023/7:19:55 PM    Final     Vitals:   12/28/23 1000 12/28/23 1048 12/28/23 1133 12/28/23 1237  BP: (!) 216/98 (!) 192/82 (!) 183/97 (!) 141/87  Pulse:   98   Resp:   16   Temp:   97.7 F (36.5 C)   TempSrc:   Oral   SpO2:   97%   Weight:      Height:         PHYSICAL EXAM General:  Alert, well-nourished, well-developed patient in no acute distress Psych:  Mood and affect appropriate for situation CV: Regular rate and rhythm on monitor Respiratory:  Regular, unlabored respirations on room air GI: Abdomen soft and nontender   NEURO:  Mental Status: AA&Ox3, patient is able to give clear and coherent history Speech/Language: speech is without dysarthria or aphasia.  Naming, repetition, fluency, and comprehension intact.  Cranial Nerves:  II: PERRL. Visual fields full.  III, IV, VI: EOMI. Eyelids elevate symmetrically.  V: Sensation is intact to light touch and symmetrical to face.  VII: Right  facial asymmetry  VIII: hearing intact to voice. IX, X: Palate elevates symmetrically. Phonation is normal.  KP:Dynloizm shrug 5/5. XII: tongue is midline without fasciculations. Motor: 5/5 strength to all muscle groups tested.  Tone: is normal and bulk is normal Sensation- Intact to light touch bilaterally. Extinction absent to light touch to DSS.   Coordination: FTN intact bilaterally, HKS: no ataxia in BLE.No drift.  Gait- deferred  Most Recent NIH 1     ASSESSMENT/PLAN  Richard Hunt is a 63 y.o. 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  NIH on Admission 3  Acute Ischemic Infarct:  right hemispheric infarcts clinically silent Etiology: High-grade proximal right ICA   stenosis and petrous right ICA occlusion MRI  Few small acute/subacute infarcts within the corona radiata, parietal lobe and occipital lobe on the right (measuring up to 5 mm). Background age-advanced chronic small vessel ischemic disease with multiple chronic lacunar infarcts, as described. MRA  Stable intracranial MRA as compared to 10/20/2023. 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 Short-segment distal reconstitution at the cavernous segment via the ophthalmic artery with subsequent reocclusion at the right ICA terminus. Moderate to severe multifocal stenoses involving the right A1 and M1 segments. Severe high-grade stenosis versus occlusion involving the distal V4 segments and vertebrobasilar junction. Patent flow within the basilar distally, which may in part be retrograde in nature. Severe bilateral P2 stenoses. Mild-to-moderate stenosis at the left ICA terminus/proximal left M1 segment. Carotid Doppler abnormal right carotid waveforms suggestive of preocclusive stenosis.   TCD elevated right MCA velocities with low pulsatility  indicative of proximal high-grade carotid stenosis or occlusion.  Elevated left MCA and ACA velocities indicating moderate stenosis. 10/2023- 2D Echo EF 60-65%, left atria dilated  LDL 50 HgbA1c 5.1 VTE prophylaxis - Lovenox  aspirin  81 mg daily and clopidogrel  75 mg daily prior to admission, now on aspirin  81 mg daily and Brilinta  (ticagrelor ) 90 mg bid for 4 weeks and then aspirin  and Plavix   . Therapy recommendations:  Outpatient PT/OT/ST Disposition:  Pending   Hx of Stroke/TIA Stroke in July 2025 Patchy small acute/subacute infarcts scattered within the callosal splenium on the left, and within the bilateral parietooccipital lobes. DAPT with ASA and plavix , 90-day course, followed by Plavix  monotherapy going forward, with patient counseled to switch to -aspirin  monotherapy if insurance/medication accessibility issues arise  MR Angio head Severe narrowing of the right ICA at its origin with poststenotic  dilatation. The right ICA terminus appears occluded past the ophthalmic segment.  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  Severe stenosis of the right PCA P2 segment. 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 Severe, near complete stenosis of the distal left vertebral artery. Will discuss right ICA with our neurointerventionalist  Hypertension Home meds:  Cozaar , verapamil , hydralazine , coreg  Stable Blood Pressure Goal: BP less than 220/110   Hyperlipidemia Home meds:  atorvastatin  80mg , resumed in hospital LDL 50, goal < 70 Continue statin at discharge  Headache Concern for Bell's Palsy  Neuro exam with R bells palsy that is not well explained by the strokes noted on MRI.  Valtrex  and prednisone    Patient with significant multivessel extracranial intracranial atherosclerosis presented with right facial droop and headache.  The facial weakness pattern appears to be lower motor neuron and may  represent Bell's palsy or a small brainstem infarct not visualized on MRI.  Brain MRI also shows multiple right hemispheric infarcts as well as recent left hemispheric infarcts from July 2025 due to severe intra and extracranial vascular stenosis.  We need to consider doing diagnostic cerebral catheter angiogram to see if she has any endovascularly treatable lesion given his poor renal function this may be risky.  Plan discussed with Dr. Todd Burns neuro interventional radiologist who agrees patient is not a candidate for any endovascular or surgical treatment at this time.  Recommend change aspirin  Plavix  to aspirin  and Brilinta  dual antiplatelet therapy and maintain aggressive risk factor modification with strict control of hypertension blood pressure goal below 140/90, lipids with LDL cholesterol goal below 70 mg percent and diabetes hemoglobin A1c goal below 6.5%..  Continue prednisone  and Valtrex  for presumed Bell's palsy.  Discussion with patient and answered questions.  Discussed with Dr. Royal and Dr. Burns and patient using Spanish language video interpreter.  Follow-up as an outpatient stroke clinic in 2 months with nurse practitioner.  Stroke team will sign off   I personally spent a total of 50 minutes in the care of the patient today including getting/reviewing separately obtained history, performing a medically appropriate exam/evaluation, counseling and educating, placing orders, referring and communicating with other health care professionals, documenting clinical information in the EHR, independently interpreting results, and coordinating care.        Eather Popp, MD Medical Director [facility] Pager: [phone] 12/28/2023 1:47 PM  To contact Stroke Continuity provider, please refer to [email or web address]. After hours, contact General Neurology

## 2023-12-28 NOTE — Consult Note (Signed)
 I had the pleasure of meeting Richard Hunt and his wife this morning.  Briefly, he actually went to the hospital due to an unrelenting left-sided headache and high blood pressure.  The above are chronic problems.  He has an appointment with a neurologist in [place], but not until February.  He sees a nephrologist in [place].  He was noted on exam to have a right 7th nerve palsy.  During this evaluation he had a brain MRI, cervical and intracranial MRA.  I reviewed these 3 studies.  He has scattered small subacute infarcts in the right hemisphere.  He has severe vertebrobasilar disease with near occlusion at the vertebrobasilar junction on the left and occlusion on the right.  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  There is no left ICA stenosis.  He has chronic renal insufficiency with a creatinine in the 2.3 range.  His blood pressure this morning was 249/120.  He has been routinely having systolic blood pressures over 799 and diastolic blood pressures over 899 on this admission despite aggressive medical therapy.  On exam his only focal neurologic deficit is the right complete 7th nerve palsy.  Assessment:  1.  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  Severe left hemicrania 4.  Uncontrolled hypertension.  I reviewed his CT arteriogram of the chest which was done for chest pain on 08/14/2023 and the adrenal glands were normal.  The right renal artery was normal.  The left was not included on that exam.  I also reviewed his abdomen CT from December 10, 2016 and there was no renal artery stenosis at that time.  Recommendation:  There is no surgical option for his cerebrovascular disease.  Continue with medical therapy.

## 2023-12-28 NOTE — Progress Notes (Signed)
 [facility]   ROUNDING   NOTE Richard Hunt FMW:[id number]  DOB: April 22, 1960  DOA: 12/26/2023  PCP: [facility]  12/28/2023,12:28 PM  LOS: 2 days    Code Status:  full     From:  home    63 year old Spanish-speaking male HTN HLD DM TY 2 with CKD 3B Previous lap chole 2018 Previous cellulitic wound managed in 2015 inpatient Chronic venous insufficiency Hospitalized 7/19-7/25 subacute infarct suspected embolic-MRI showed severe narrowing at different levels of R ICA MCA right PCA patient was placed on DAPT with Plavix  indefinitely after 3 months and was discharged home  9/24 present [facility] ED right sided headache facial drooping blurred vision starting on 9/21 or even longer \\Blood  pressure was uncontrolled Found to have stroke and workup ensued  Labs on admit Sodium 138 potassium 4.3 BUN/creatinine 40/2.4 LFTs normal--- LDL 50 VLDL 16 WBC 9.4 hemoglobin 86.2 platelet 248  Imaging MR brain small subacute infarcts within corona radiata parietal occipital lobe background advanced small vessel ischemic changes-chronically abnormal right ICA flow void severe stenosis demonstrated on prior imaging MR angio head stable intracranial MRA compared to 10/20/2023 severe high-grade stenosis versus occlusion involving distal V4 vertebral basilar junction patent flow within the sella distally severe bilateral P2 stenosis-mild to moderate stenosis left ICA terminus proximal M1 12/2023 echocardiogram EF 55% with grade 1 DD no valvular issues TCD elevated R MCA velocities indicative of proximal high-grade carotid stenosis, abnormal right carotid waveforms some stenosis   Assessment  & Plan :    Stroke-right ischemic infarcts No neurointervention-imaging is chronic Aspirin  81 and Brilinta  90 twice daily lifelong ?  Bell's palsy Started by neurology on Valtrex  1000 twice daily and prednisone  60 Defer to them duration etc. For headaches will resume previous gabapentin  Noncardiac chest pain?  GERD EKG  reviewed troponin only 18 Suspect more reflux related started back PPI 9/25 DMT by 2 A1c this hospital stay 5.1 indicative of very good control Blood sugars well-controlled 90s to 130s Resuming Amaryl  1 mg a.m. CKD 3B Needs outpatient  nephrology follow-up-creatinine is at usual baseline HTN HLD Severe hypertension with urgency Resumed all agents given less likely stroke--- now back on Coreg  12.5 twice daily hydralazine  150 3 times daily (and increase) HCTZ 25 losartan  100 verapamil  80 Q8 Started clonidine  0.1 daily as needed blood pressures are much better controlled on this 4 hyperlipidemia already on max dose 80 mg Lipitor  Chronic venous insufficiency Monitor only  Long discussion with family including daughters at the bedside  Data Reviewed:   Sodium 137 potassium 3.6 BUN/creatinine 31/2.2   DVT prophylaxis: Brilinta /SCD  Status is: Inpatient Remains inpatient appropriate because: Await workup     Current Dispo:      Subjective:    Looks well feels well Had some headaches earlier this morning 7/10 currently improved with controlled blood pressure No visual changes Feels overall better in the afternoon when I reviewed him   Objective + exam Vitals:   12/28/23 0906 12/28/23 1000 12/28/23 1048 12/28/23 1133  BP: (!) 202/110 (!) 216/98 (!) 192/82 (!) 183/97  Pulse:    98  Resp:    16  Temp:    97.7 F (36.5 C)  TempSrc:    Oral  SpO2:    97%  Weight:      Height:       Filed Weights   12/26/23 1900  Weight: 88.5 kg     Examination:  Drooping lip on the right side S1-S2 no murmur Abdomen soft no rebound  ROM intact Power 5/5 bilaterally no focal deficit Speech is little slurred  Scheduled Meds:  aspirin  EC  81 mg Oral Daily   atorvastatin   80 mg Oral QHS   carvedilol   12.5 mg Oral q AM   cloNIDine   0.2 mg Oral BID   enoxaparin  (LOVENOX ) injection  40 mg Subcutaneous QHS   gabapentin   100 mg Oral QHS   hydrALAZINE   150 mg Oral TID    hydrochlorothiazide   25 mg Oral Q1500   insulin  aspart  0-5 Units Subcutaneous QHS   insulin  aspart  0-9 Units Subcutaneous TID WC   losartan   100 mg Oral QHS   pantoprazole   40 mg Oral Daily   predniSONE   60 mg Oral Q breakfast   ticagrelor   90 mg Oral BID   valACYclovir   1,000 mg Oral Q12H   verapamil   80 mg Oral Q8H   Continuous Infusions:    Time 60  Jai-Gurmukh Gearldine Looney, MD  [facility]

## 2023-12-29 ENCOUNTER — Other Ambulatory Visit (HOSPITAL_COMMUNITY): Payer: Self-pay

## 2023-12-29 DIAGNOSIS — I639 Cerebral infarction, unspecified: Secondary | ICD-10-CM | POA: Diagnosis not present

## 2023-12-29 LAB — CARDIOLIPIN ANTIBODIES, IGG, IGM, IGA
Anticardiolipin IgA: <9 U/mL (ref 0–11)
Anticardiolipin IgG: <9 GPL U/mL (ref 0–14)
Anticardiolipin IgM: <9 [MPL'U]/mL (ref 0–12)

## 2023-12-29 LAB — GLUCOSE, CAPILLARY
Glucose-Capillary: 186 mg/dL — ABNORMAL HIGH (ref 70–99)
Glucose-Capillary: 171 mg/dL — ABNORMAL HIGH (ref 70–99)

## 2023-12-29 MED ORDER — CLONIDINE HCL 0.1 MG PO TABS
0.1000 mg | ORAL_TABLET | Freq: Every day | ORAL | 11 refills | Status: DC | PRN
  Filled 2023-12-29: qty 60, 60d supply, fill #0

## 2023-12-29 MED ORDER — PANTOPRAZOLE SODIUM 40 MG PO TBEC: 40.0000 mg | DELAYED_RELEASE_TABLET | Freq: Every day | ORAL | Status: DC

## 2023-12-29 MED ORDER — PREDNISONE 20 MG PO TABS
60.0000 mg | ORAL_TABLET | Freq: Every day | ORAL | 0 refills | Status: AC
  Filled 2023-12-29: qty 15, 5d supply, fill #0

## 2023-12-29 MED ORDER — TICAGRELOR 90 MG PO TABS
90.0000 mg | ORAL_TABLET | Freq: Two times a day (BID) | ORAL | 11 refills | Status: AC
  Filled 2023-12-29: qty 60, 30d supply, fill #0

## 2023-12-29 MED ORDER — VALACYCLOVIR HCL 1 G PO TABS
1000.0000 mg | ORAL_TABLET | Freq: Two times a day (BID) | ORAL | 0 refills | Status: DC
  Filled 2023-12-29: qty 11, 6d supply, fill #0

## 2023-12-29 MED ORDER — POLYVINYL ALCOHOL 1.4 % OP SOLN
1.0000 [drp] | OPHTHALMIC | 0 refills | Status: AC | PRN
  Filled 2023-12-29: qty 15, fill #0

## 2023-12-29 NOTE — Discharge Summary (Signed)
 Physician Discharge Summary  Richard Hunt FMW:[id number] DOB: 06-20-1960 DOA: 12/26/2023  PCP: Cletus Glenn  Admit date: 12/26/2023 Discharge date: 12/29/2023  Time spent: 60 minutes  Recommendations for Outpatient Follow-up:  Complete prednisone  and Valtrex  for Bell's palsy outpatient follow-up with PCP/neuro Plavix  changed to Brilinta  twice daily lifelong with aspirin  81 lifelong this admission 6-week follow-up Dr Rosemarie number placed in chart for patient to call May need outpatient evaluation of severe hypertension-unclear if compliant with all meds as some of them are 3 times daily-May need a discussion with PCP about how he takes them-I have reinforced taking them regularly  Discharge Diagnoses:  MAIN problem for hospitalization   Recurrent stroke Bell's palsy with confounding  Please see below for itemized issues addressed in [facility]- refer to other progress notes for clarity if needed  Discharge Condition: Improved  Diet recommendation: Diabetic heart healthy  Filed Weights   12/26/23 1900  Weight: 88.5 kg    History of present illness:  63 year old Spanish-speaking male HTN HLD DM TY 2 with CKD 3B Previous lap chole 2018 Previous cellulitic wound managed in 2015 inpatient Chronic venous insufficiency Hospitalized 7/19-7/25 subacute infarct suspected embolic-MRI showed severe narrowing at different levels of R ICA MCA right PCA patient was placed on DAPT with Plavix  indefinitely after 3 months and was discharged home   9/24 present [facility] ED right sided headache facial drooping blurred vision starting on 9/21 or even longer \\Blood  pressure was uncontrolled Found to have stroke and workup ensued   Labs on admit Sodium 138 potassium 4.3 BUN/creatinine 40/2.4 LFTs normal--- LDL 50 VLDL 16 WBC 9.4 hemoglobin 86.2 platelet 248   Imaging MR brain small subacute infarcts within corona radiata parietal occipital lobe background advanced small vessel  ischemic changes-chronically abnormal right ICA flow void severe stenosis demonstrated on prior imaging MR angio head stable intracranial MRA compared to 10/20/2023 severe high-grade stenosis versus occlusion involving distal V4 vertebral basilar junction patent flow within the sella distally severe bilateral P2 stenosis-mild to moderate stenosis left ICA terminus proximal M1 12/2023 echocardiogram EF 55% with grade 1 DD no valvular issues TCD elevated R MCA velocities indicative of proximal high-grade carotid stenosis, abnormal right carotid waveforms some stenosis    Assessment  & Plan :      Stroke-right ischemic infarcts No neurointervention-imaging is chronic as per Dr. Lester who saw the patient during hospital stay He will need close follow-up with neurology in the outpatient setting given second stroke-patient aware-CC Dr Rosemarie Aspirin  81 and Brilinta  90 twice daily lifelong and verified that Brilinta  was affordable ?  Bell's palsy Started by neurology on Valtrex  1000 twice daily and prednisone  60--finished a likely total of 7 days For headaches will resume previous gabapentin  Noncardiac chest pain?  GERD EKG reviewed troponin only 18 Suspect more reflux related started back PPI 9/25 No further recurrences of symptoms he is stable DMT by 2 A1c this hospital stay 5.1 indicative of very good control Blood sugars well-controlled 90s to 130s Resuming Amaryl  1 mg at discharge she is stable and needs outpatient follow-up with other labs CKD 3B Needs outpatient  nephrology follow-up-creatinine is at usual baseline HTN HLD Severe hypertension with urgency Resumed all agents given less likely stroke--- now back on Coreg  12.5 twice daily hydralazine  100 3 times daily HCTZ 25 losartan  100 verapamil  80 Q8 Started clonidine  0.1 daily as needed blood pressures greater than send 180 At discharge pressures  much better controlled leading me to think he cannot take 3 times daily  meds but that is  unfortunate and he needs to try to put an alarm in his phone to make sure he takes them 4 hyperlipidemia already on max dose 80 mg Lipitor  Chronic venous insufficiency Monitor only  Discharge Exam: Vitals:   12/29/23 0633 12/29/23 0736  BP: (!) 122/92 (!) 145/74  Pulse:  75  Resp:  16  Temp:  98.4 F (36.9 C)  SpO2:  99%    Subj on day of d/c   Awake coherent had a full breakfast Still has a little bit of paresthesias on the right side  General Exam on discharge  EOMI NCAT droop over right side with no facial sparing No conjunctivitis S1-S2 no murmur Chest is clear no wheeze Abdomen obese nontender no rebound ROM intact Power 5/5 grossly  Discharge Instructions   Discharge Instructions     Diet - low sodium heart healthy   Complete by: As directed    Discharge instructions   Complete by: As directed    This hospital stay you were diagnosed with a recurrent stroke Your medications have changed to some degree and you will be taking a new tablet called Brilinta  twice a day in addition to your daily aspirin  that we will call into our local pharmacy as we are able to get it at a substantially better price Do not take Plavix  any longer I have added additionally a tablet called clonidine  for blood pressure >180 systolic Take all your other blood pressure meds  For the Bell's palsy continue the antiviral as well as the prednisone  that neurology prescribed and complete them You may need to use eyedrops to help protect your eyes I would recommend follow-up with your primary   Increase activity slowly   Complete by: As directed       Allergies as of 12/29/2023   No Known Allergies      Medication List     STOP taking these medications    clopidogrel  75 MG tablet Commonly known as: PLAVIX    glipiZIDE  5 MG tablet Commonly known as: GLUCOTROL        TAKE these medications    acetaminophen  325 MG tablet Commonly known as: TYLENOL  Take 2 tablets (650 mg  total) by mouth every 4 (four) hours as needed for mild pain (pain score 1-3) or fever (or temp > 37.5 C (99.5 F)).   artificial tears ophthalmic solution Place 1 drop into both eyes as needed for dry eyes.   Aspirin  Low Dose 81 MG chewable tablet Generic drug: aspirin  Masticar 1 tableta (81 mg en total) por va oral diariamente. (Chew 1 tablet (81 mg total) by mouth daily.) What changed: when to take this   atorvastatin  80 MG tablet Commonly known as: LIPITOR  Tome 1 tableta (80 mg en total) por va oral antes de acostarse. (Take 1 tablet (80 mg total) by mouth at bedtime.)   carvedilol  12.5 MG tablet Commonly known as: COREG  Take 12.5 mg by mouth in the morning.   cloNIDine  0.1 MG tablet Commonly known as: CATAPRES  Take 1 tablet (0.1 mg total) by mouth daily as needed (bP >180 systolic).   gabapentin  100 MG capsule Commonly known as: NEURONTIN  Take 100 mg by mouth at bedtime.   glimepiride  1 MG tablet Commonly known as: AMARYL  Take 1 mg by mouth daily with breakfast.   hydrALAZINE  100 MG tablet Commonly known as: APRESOLINE  Tomar 1 tableta (100 mg en total) por va oral 3 (tres) veces al [employer]. (Take 1 tablet (100 mg total)  by mouth 3 (three) times daily.) What changed: when to take this   hydrochlorothiazide  25 MG tablet Commonly known as: HYDRODIURIL  Take 25 mg by mouth daily in the afternoon.   losartan  100 MG tablet Commonly known as: COZAAR  Take 100 mg by mouth at bedtime.   pantoprazole  40 MG tablet Commonly known as: PROTONIX  Take 1 tablet (40 mg total) by mouth daily.   predniSONE  20 MG tablet Commonly known as: DELTASONE  Take 3 tablets (60 mg total) by mouth daily with breakfast for 5 days.   ticagrelor  90 MG Tabs tablet Commonly known as: BRILINTA  Take 1 tablet (90 mg total) by mouth 2 (two) times daily.   valACYclovir  1000 MG tablet Commonly known as: VALTREX  Take 1 tablet (1,000 mg total) by mouth every 12 (twelve) hours.   verapamil  80 MG  tablet Commonly known as: CALAN  Tomar 1 tableta (80 mg en total) por va oral cada 8 (ocho) horas. (Take 1 tablet (80 mg total) by mouth every 8 (eight) hours.)       No Known Allergies    The results of significant diagnostics from this hospitalization (including imaging, microbiology, ancillary and laboratory) are listed below for reference.    Significant Diagnostic Studies: VAS US  TRANSCRANIAL DOPPLER Result Date: 12/28/2023  Transcranial Doppler Patient Name:  Richard Hunt  Date of Exam:   12/27/2023 Medical Rec #: [id number]                Accession #:    [id number] Date of Birth: Jan 01, 1961                Patient Gender: M Patient Age:   24 years Exam Location:  [facility] Procedure:      VAS US  TRANSCRANIAL DOPPLER Referring Phys: DEVON SHAFER --------------------------------------------------------------------------------  Indications: Stroke. History: Significant of HTN, HLD, DM2, CKD stage IIIb, chronic venous insufficiency, ischemic CVA, right ICA stenosis, GERD. Presented tol ED on 12/26/2023 with complaints of right-sided headache with associated facial drooping and blurred vision. Performing Technologist: Ricka Sturdivant-Jones RDMS, RVT  Examination Guidelines: A complete evaluation includes B-mode imaging, spectral Doppler, color Doppler, and power Doppler as needed of all accessible portions of each vessel. Bilateral testing is considered an integral part of a complete examination. Limited examinations for reoccurring indications may be performed as noted.  +----------+---------------+----------+-----------+-------+ RIGHT TCD Right VM (cm/s)Depth (cm)PulsatilityComment +----------+---------------+----------+-----------+-------+ MCA             132                   0.74            +----------+---------------+----------+-----------+-------+ ACA             -34                   0.79             +----------+---------------+----------+-----------+-------+ Term ICA        42                    0.93            +----------+---------------+----------+-----------+-------+ PCA P1          27                    0.70            +----------+---------------+----------+-----------+-------+ Opthalmic       23  1.37            +----------+---------------+----------+-----------+-------+ ICA siphon      39                    1.07            +----------+---------------+----------+-----------+-------+ Vertebral       -16                   0.71            +----------+---------------+----------+-----------+-------+ Distal ICA      19                    0.72            +----------+---------------+----------+-----------+-------+  +----------+--------------+----------+-----------+-------------+ LEFT TCD  Left VM (cm/s)Depth (cm)Pulsatility   Comment    +----------+--------------+----------+-----------+-------------+ MCA            130                   0.99                  +----------+--------------+----------+-----------+-------------+ ACA            -120                  1.03                  +----------+--------------+----------+-----------+-------------+ Term ICA        90                   0.88                  +----------+--------------+----------+-----------+-------------+ PCA P1          35                   0.37                  +----------+--------------+----------+-----------+-------------+ Opthalmic       26                   1.97                  +----------+--------------+----------+-----------+-------------+ ICA siphon                                   not insonated +----------+--------------+----------+-----------+-------------+ Vertebral      -41                   1.12                  +----------+--------------+----------+-----------+-------------+ Distal ICA      44                   0.90                   +----------+--------------+----------+-----------+-------------+  +------------+-------+-------------+             VM cm/s   Comment    +------------+-------+-------------+ Prox Basilar       not insonated +------------+-------+-------------+ Dist Basilar       not insonated +------------+-------+-------------+ +----------------------+---+ Right Lindegaard Ratio6.9 +----------------------+---+ +---------------------+---+ Left Lindegaard Ratio2.9 +---------------------+---+  Summary:  Elevated right middle cerebral artery mean flow velocities with low pulsatility indicates and low distal ICA flow velocities suggest proximal carotid occlusion with collateral flow.  elevated left middle cerebral and anterior cerebral artery mean flow velocities suggest moderate stenosis. Basilar artery not insonated likely due to technical difficulties. *See table(s) above for TCD measurements and observations.  Diagnosing physician: Eather Popp MD Electronically signed by Eather Popp MD on 12/28/2023 at 1:31:18 PM.    Final    VAS US  CAROTID Result Date: 12/28/2023 Carotid Arterial Duplex Study Patient Name:  Richard Hunt  Date of Exam:   12/27/2023 Medical Rec #: [id number]                Accession #:    [id number] Date of Birth: [birth year]                Patient Gender: M Patient Age:   78 years Exam Location:  [facility] Procedure:      VAS US  CAROTID Referring Phys: REDIA CLEAVER --------------------------------------------------------------------------------  Indications:       CVA, Carotid artery disease, Visual disturbance, Weakness and                    History of RICA stenosis. Risk Factors:      Hypertension, hyperlipidemia, Diabetes. Other Factors:     GERD. Comparison Study:  No prior carotid duplex study.                    10/20/2023 - MRA Neck showed severe narrowing of right ICA at                    its origin with poststenotic dilatation. Performing Technologist: Ricka  Sturdivant-Jones RDMS, RVT  Examination Guidelines: A complete evaluation includes B-mode imaging, spectral Doppler, color Doppler, and power Doppler as needed of all accessible portions of each vessel. Bilateral testing is considered an integral part of a complete examination. Limited examinations for reoccurring indications may be performed as noted.  Right Carotid Findings: +----------+--------+--------+--------+------------------+---------------------+           PSV cm/sEDV cm/sStenosisPlaque DescriptionComments              +----------+--------+--------+--------+------------------+---------------------+ CCA Prox  78      0                                 abnormal waveforms    +----------+--------+--------+--------+------------------+---------------------+ CCA Distal66      12                                intimal thickening    +----------+--------+--------+--------+------------------+---------------------+ ICA Prox  57                      homogeneous       patent with                                                               preocclusive  waveforms             +----------+--------+--------+--------+------------------+---------------------+ ICA Mid   91      9                                                       +----------+--------+--------+--------+------------------+---------------------+ ICA Distal91                                                              +----------+--------+--------+--------+------------------+---------------------+ ECA       125     14                                                      +----------+--------+--------+--------+------------------+---------------------+ +----------+--------+-------+----------------+-------------------+           PSV cm/sEDV cmsDescribe        Arm Pressure (mmHG)  +----------+--------+-------+----------------+-------------------+ Subclavian132            Multiphasic, WNL                    +----------+--------+-------+----------------+-------------------+ +---------+--------+--+--------+--+---------+ VertebralPSV cm/s67EDV cm/s17Antegrade +---------+--------+--+--------+--+---------+  Left Carotid Findings: +----------+--------+--------+--------+------------------+---------------------+           PSV cm/sEDV cm/sStenosisPlaque DescriptionComments              +----------+--------+--------+--------+------------------+---------------------+ CCA Prox  86      23                                                      +----------+--------+--------+--------+------------------+---------------------+ CCA Distal146     41      <50%    heterogenous                            +----------+--------+--------+--------+------------------+---------------------+ ICA Prox  124     37      1-39%                                           +----------+--------+--------+--------+------------------+---------------------+ ICA Mid   130     50      40-59%  homogeneous       velocities suggest                                                        lower end             +----------+--------+--------+--------+------------------+---------------------+ ICA Distal126     46                                                      +----------+--------+--------+--------+------------------+---------------------+  ECA       107     14                                                      +----------+--------+--------+--------+------------------+---------------------+ +----------+--------+--------+----------------+-------------------+           PSV cm/sEDV cm/sDescribe        Arm Pressure (mmHG) +----------+--------+--------+----------------+-------------------+ [id number]             Multiphasic, WNL                     +----------+--------+--------+----------------+-------------------+ +---------+--------+--+--------+--+---------+ VertebralPSV cm/s85EDV cm/s19Antegrade +---------+--------+--+--------+--+---------+   Summary: Right Carotid: Velocities in the right ICA are consistent with a 1-39% stenosis.                Patent right ICA with abnormal preocclusive waveforms noted in                the proximal and distal segments without a significant proximal                stenosis but suggest distal occlusion versus high grade stenosis. Left Carotid: Velocities in the left ICA are consistent with a 40-59% stenosis. Vertebrals:  Bilateral vertebral arteries demonstrate antegrade flow. Subclavians: Normal flow hemodynamics were seen in bilateral subclavian              arteries. *See table(s) above for measurements and observations.  Electronically signed by Eather Popp MD on 12/28/2023 at 1:28:28 PM.    Final    ECHOCARDIOGRAM COMPLETE Result Date: 12/27/2023    ECHOCARDIOGRAM REPORT   Patient Name:   Richard Hunt Date of Exam: 12/27/2023 Medical Rec #:  [id number]               Height:       65.0 in Accession #:    [id number]              Weight:       195.0 lb Date of Birth:  01/29/1961               BSA:          1.957 m Patient Age:    63 years                BP:           179/85 mmHg Patient Gender: M                       HR:           76 bpm. Exam Location:  Inpatient Procedure: 2D Echo, Cardiac Doppler and Color Doppler (Both Spectral and Color            Flow Doppler were utilized during procedure). Indications:    Stroke  History:        Patient has prior history of Echocardiogram examinations, most                 recent 10/21/2023. Stroke; Risk Factors:Hypertension and                 Dyslipidemia.  Sonographer:    Juliene Rucks Referring Phys: ERIC J [place] IMPRESSIONS  1. Left ventricular ejection fraction, by estimation, is 55%. Left ventricular ejection fraction by 2D MOD biplane is  58.7 %. The  left ventricle has normal function. The left ventricle has no regional wall motion abnormalities. Left ventricular diastolic parameters are consistent with Grade I diastolic dysfunction (impaired relaxation).  2. Right ventricular systolic function is normal. The right ventricular size is normal. Tricuspid regurgitation signal is inadequate for assessing PA pressure.  3. The mitral valve is normal in structure. Mild mitral valve regurgitation. No evidence of mitral stenosis.  4. The aortic valve is tricuspid. There is mild calcification of the aortic valve. Aortic valve regurgitation is not visualized. No aortic stenosis is present.  5. The inferior vena cava is normal in size with greater than 50% respiratory variability, suggesting right atrial pressure of 3 mmHg. FINDINGS  Left Ventricle: Left ventricular ejection fraction, by estimation, is 55%. Left ventricular ejection fraction by 2D MOD biplane is 58.7 %. The left ventricle has normal function. The left ventricle has no regional wall motion abnormalities. The left ventricular internal cavity size was normal in size. There is no left ventricular hypertrophy. Left ventricular diastolic parameters are consistent with Grade I diastolic dysfunction (impaired relaxation). Right Ventricle: The right ventricular size is normal. No increase in right ventricular wall thickness. Right ventricular systolic function is normal. Tricuspid regurgitation signal is inadequate for assessing PA pressure. Left Atrium: Left atrial size was normal in size. Right Atrium: Right atrial size was normal in size. Pericardium: There is no evidence of pericardial effusion. Mitral Valve: The mitral valve is normal in structure. Mild mitral valve regurgitation. No evidence of mitral valve stenosis. Tricuspid Valve: The tricuspid valve is normal in structure. Tricuspid valve regurgitation is not demonstrated. Aortic Valve: The aortic valve is tricuspid. There is mild calcification of the  aortic valve. Aortic valve regurgitation is not visualized. No aortic stenosis is present. Aortic valve mean gradient measures 6.0 mmHg. Aortic valve peak gradient measures 11.4 mmHg. Aortic valve area, by VTI measures 2.13 cm. Pulmonic Valve: The pulmonic valve was normal in structure. Pulmonic valve regurgitation is not visualized. Aorta: The aortic root is normal in size and structure. Venous: The inferior vena cava is normal in size with greater than 50% respiratory variability, suggesting right atrial pressure of 3 mmHg. IAS/Shunts: No atrial level shunt detected by color flow Doppler.  LEFT VENTRICLE PLAX 2D                        Biplane EF (MOD) LVIDd:         6.10 cm         LV Biplane EF:   Left LVIDs:         4.10 cm                          ventricular LV PW:         1.20 cm                          ejection LV IVS:        1.10 cm                          fraction by LVOT diam:     2.00 cm                          2D MOD LV SV:         74  biplane is LV SV Index:   38                               58.7 %. LVOT Area:     3.14 cm                                Diastology                                LV e' medial:    4.03 cm/s LV Volumes (MOD)               LV E/e' medial:  16.0 LV vol d, MOD    138.0 ml      LV e' lateral:   7.72 cm/s A2C:                           LV E/e' lateral: 8.4 LV vol d, MOD    145.0 ml A4C: LV vol s, MOD    67.2 ml A2C: LV vol s, MOD    54.5 ml A4C: LV SV MOD A2C:   70.8 ml LV SV MOD A4C:   145.0 ml LV SV MOD BP:    84.5 ml RIGHT VENTRICLE             IVC RV Basal diam:  3.30 cm     IVC diam: 1.30 cm RV Mid diam:    2.30 cm RV S prime:     17.30 cm/s TAPSE (M-mode): 2.3 cm LEFT ATRIUM             Index        RIGHT ATRIUM           Index LA diam:        4.30 cm 2.20 cm/m   RA Area:     16.50 cm LA Vol (A2C):   83.9 ml 42.87 ml/m  RA Volume:   47.00 ml  24.02 ml/m LA Vol (A4C):   62.9 ml 32.14 ml/m LA Biplane Vol: 76.9 ml 39.30 ml/m  AORTIC  VALVE AV Area (Vmax):    2.16 cm AV Area (Vmean):   2.16 cm AV Area (VTI):     2.13 cm AV Vmax:           169.00 cm/s AV Vmean:          112.000 cm/s AV VTI:            0.346 m AV Peak Grad:      11.4 mmHg AV Mean Grad:      6.0 mmHg LVOT Vmax:         116.00 cm/s LVOT Vmean:        77.000 cm/s LVOT VTI:          0.235 m LVOT/AV VTI ratio: 0.68  AORTA Ao Root diam: 3.40 cm Ao Asc diam:  3.00 cm MITRAL VALVE MV Area (PHT): 3.42 cm     SHUNTS MV Decel Time: 222 msec     Systemic VTI:  0.24 m MR Peak grad: 45.4 mmHg     Systemic Diam: 2.00 cm MR Vmax:      337.00 cm/s MV E velocity: 64.50 cm/s MV A velocity: 104.00 cm/s MV E/A ratio:  0.62 [employer]  signed by Ezra Kanner Signature Date/Time: 12/27/2023/7:19:55 PM    Final    MR ANGIO HEAD WO CONTRAST Result Date: 12/27/2023 CLINICAL DATA:  Follow-up examination for stroke. EXAM: MRA HEAD WITHOUT CONTRAST TECHNIQUE: Angiographic images of the Circle of Willis were acquired using MRA technique without intravenous contrast. COMPARISON:  Comparison made with brain MRI from 12/26/2023. FINDINGS: Anterior circulation: Irregular attenuated flow within the distal cervical right ICA with subsequent occlusion at the horizontal petrous segment. Mild distal reconstitution at the cavernous segment via the ophthalmic artery with subsequent reocclusion at the terminus. Appearance is stable from prior. Contralateral left ICA remains patent to the terminus without stenosis or other abnormality. Left A1 segment dominant and widely patent. Irregular attenuated flow within the right A1 segment with severe stenosis at its proximal aspect (series 1031, image 10). Normal anterior communicating artery complex. Both ACAs are patent to their distal aspects without significant stenosis. Focal mild-to-moderate stenosis at the left ICA terminus/proximal left M1 segment (series 1031, image 10). Right M1 segment is markedly irregular and diminutive with moderate to  severe multifocal stenoses (series 1031, image 11), relatively stable from prior. No proximal MCA branch occlusion. Right MCA branches are overall somewhat attenuated as compared to the left. Posterior circulation: Both vertebral arteries are patent as they cross into the cranial vault. Severe high-grade stenosis versus occlusion involving the distal V4 segments and vertebrobasilar junction with associated flow gap, relatively stable from prior (series 5, images 51, 56). Basilar is mildly irregular but patent distally, which may in part be retrograde in nature. Superior cerebral arteries patent bilaterally. Both PCAs primarily supplied via the basilar. Atheromatous change about the PCAs with associated severe bilateral P2 stenoses (series 1013, image 4). PCAs remain patent to their distal aspects, with the left slightly attenuated as compared to the right. Anatomic variants: As above. Other: No intracranial aneurysm. Overall, appearance of the intracranial circulation is relatively stable from prior MRA from 10/20/2023. Neck soft IMPRESSION: 1. Stable intracranial MRA as compared to 10/20/2023. 2. 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 Short-segment distal reconstitution at the cavernous segment via the ophthalmic artery with subsequent reocclusion at the right ICA terminus. 3. Moderate to severe multifocal stenoses involving the right A1 and M1 segments. 4. Severe high-grade stenosis versus occlusion involving the distal V4 segments and vertebrobasilar junction. Patent flow within the basilar distally, which may in part be retrograde in nature. 5. Severe bilateral P2 stenoses. 6. Mild-to-moderate stenosis at the left ICA terminus/proximal left M1 segment. Electronically Signed   By: Morene Hoard M.D.   On: 12/27/2023 02:40   MR Brain Wo Contrast (neuro protocol) Result Date: 12/26/2023 CLINICAL DATA:  Provided history: Neuro deficit, acute, stroke suspected. Additional history  provided: Right-sided headache, facial droop, blurred vision. EXAM: MRI HEAD WITHOUT CONTRAST TECHNIQUE: Multiplanar, multiecho pulse sequences of the brain and surrounding structures were obtained without intravenous contrast. COMPARISON:  Brain MRI 10/19/2023.  MRA head 10/20/2023. FINDINGS: Brain: No age-advanced or lobar predominant cerebral atrophy. There are a few small foci of diffusion-weighted signal abnormality within the parietal cortex, occipital cortex, occipital white matter and corona radiata on the right consistent with acute/subacute infarcts (measuring up to 5 mm). Chronic lacunar infarcts within the bilateral cerebral hemispheric white matter, within the callosal splenium on the left, within/about the bilateral deep gray nuclei and within the right middle cerebellar peduncle/cerebellar white matter. Moderate multifocal T2 FLAIR hyperintense signal abnormality elsewhere within the cerebral white matter, nonspecific but compatible with chronic small  vessel ischemic disease. Few nonspecific chronic microhemorrhages scattered within the supratentorial brain. No evidence of an intracranial mass. No extra-axial fluid collection. No midline shift. Vascular: Chronically abnormal right ICA flow void. Flow voids preserved elsewhere within the proximal large arterial vessels. Skull and upper cervical spine: No focal worrisome marrow lesion. Sinuses/Orbits: No mass or acute finding within the imaged orbits. No significant paranasal sinus disease. Other: Small-volume fluid within bilateral mastoid air cells. IMPRESSION: 1. Few small acute/subacute infarcts within the corona radiata, parietal lobe and occipital lobe on the right (measuring up to 5 mm). 2. Background age-advanced chronic small vessel ischemic disease with multiple chronic lacunar infarcts, as described. 3. Chronically abnormal right internal carotid artery flow void. Sites of severe stenosis and occlusion were demonstrated within this vessel on  the prior MRA head/neck of 10/20/2023. Please refer to these prior examinations for further description. 4. Small-volume fluid within bilateral mastoid air cells. Electronically Signed   By: Rockey Childs D.O.   On: 12/26/2023 12:53    Microbiology: No results found for this or any previous visit (from the past 240 hours).   Labs: Basic Metabolic Panel: Recent Labs  Lab 12/26/23 0850 12/26/23 2048 12/27/23 0507 12/28/23 0155  NA 138  --  137 137  K 4.3  --  3.9 3.6  CL 105  --  109 106  CO2 19*  --  21* 20*  GLUCOSE 107*  --  94 91  BUN 40*  --  30* 31*  CREATININE 2.40* 2.24* 2.15* 2.25*  CALCIUM  9.2  --  8.6* 8.6*   Liver Function Tests: Recent Labs  Lab 12/26/23 0850  AST 25  ALT 30  ALKPHOS 126  BILITOT 0.6  PROT 7.7  ALBUMIN 4.3   No results for input(s): LIPASE, AMYLASE in the last 168 hours. No results for input(s): AMMONIA in the last 168 hours. CBC: Recent Labs  Lab 12/26/23 0850 12/26/23 2048  WBC 9.4 8.3  NEUTROABS 6.5  --   HGB 13.7 11.9*  HCT 40.3 34.2*  MCV 91.8 89.8  PLT 248 223   Cardiac Enzymes: No results for input(s): CKTOTAL, CKMB, CKMBINDEX, TROPONINI in the last 168 hours. BNP: BNP (last 3 results) No results for input(s): BNP in the last 8760 hours.  ProBNP (last 3 results) No results for input(s): PROBNP in the last 8760 hours.  CBG: Recent Labs  Lab 12/28/23 0620 12/28/23 1139 12/28/23 1635 12/28/23 2152 12/29/23 0634  GLUCAP 97 138* 206* 219* 186*    Signed:  Colen Grimes MD   [facility] 12/29/2023, 7:53 AM

## 2023-12-29 NOTE — Plan of Care (Signed)

## 2024-01-27 ENCOUNTER — Other Ambulatory Visit: Payer: Self-pay

## 2024-02-08 ENCOUNTER — Other Ambulatory Visit: Payer: Self-pay

## 2024-03-30 ENCOUNTER — Ambulatory Visit
Admission: EM | Admit: 2024-03-30 | Discharge: 2024-03-30 | Discharge status: Against Medical Advice | Attending: Student | Admitting: Student

## 2024-03-30 DIAGNOSIS — R0789 Other chest pain: Secondary | ICD-10-CM | POA: Diagnosis not present

## 2024-03-30 NOTE — ED Triage Notes (Addendum)
 Richard Hunt [id number]  Sx x 3 days   SOB  Chest pain , left side. Hx of heart issues. Pain doesn't move anywhere  Chest pain gest worse at night when laying down

## 2024-03-30 NOTE — ED Provider Notes (Addendum)
 " [facility]    CSN: [id number] Arrival date & time: 03/30/24  1506      History   Chief Complaint Chief Complaint  Patient presents with   Shortness of Breath   Chest Pain    HPI Richard Hunt is a 63 y.o. male presenting with chest pain and shortness of breath.  He is followed by a cardiologist for his extensive cardiac history, including CVA, chronic venous insufficiency, hypertension. Presenting with chest pain x2-3 weeks, feels like choking and difficult to breathe, but shortness of breath is worsening over the last day. Denies dizziness, weakness  Spoke with the patient and his wife using language line - interpreter [place] [phone]  HPI  Past Medical History:  Diagnosis Date   Acute cholecystitis 12/11/2016   Acute kidney injury (nontraumatic)    Bilateral edema of lower extremity 01/02/2014   Chronic venous insufficiency 01/16/2014   Diabetes (HCC) 12/11/2016   Diabetes mellitus without complication (HCC)    GERD (gastroesophageal reflux disease)    HLD (hyperlipidemia)    HTN (hypertension) 12/11/2016   Hypercholesterolemia    Hypertension    Osteoarthritis    Polycystic kidney disease    Ulcer of calf (HCC) 08/29/2013    Patient Active Problem List   Diagnosis Date Noted   Acute CVA (cerebrovascular accident) (HCC) 12/26/2023   CVA (cerebral vascular accident) (HCC) 10/20/2023   GERD (gastroesophageal reflux disease) 10/20/2023   Acute cholecystitis 12/11/2016   Diabetes (HCC) 12/11/2016   HTN (hypertension) 12/11/2016   HLD (hyperlipidemia) 12/11/2016   Chronic venous insufficiency 01/16/2014   Bilateral edema of lower extremity 01/02/2014   Ulcer of calf (HCC) 08/29/2013    Past Surgical History:  Procedure Laterality Date   CHOLECYSTECTOMY N/A 12/11/2016   Procedure: LAPAROSCOPIC CHOLECYSTECTOMY;  Surgeon: Shelva Dunnings, MD;  Location: [facility] ORS;  Service: General;  Laterality: N/A;   VEIN SURGERY         Home Medications     Prior to Admission medications  Medication Sig Start Date End Date Taking? Authorizing Provider  atorvastatin  (LIPITOR ) 80 MG tablet Take 1 tablet (80 mg total) by mouth at bedtime. 10/26/23  Yes Fausto Sor A, DO  carvedilol  (COREG ) 12.5 MG tablet Take 12.5 mg by mouth in the morning.   Yes [provider]  cloNIDine  (CATAPRES ) 0.1 MG tablet Take 1 tablet (0.1 mg total) by mouth daily as needed (bP >180 systolic). 12/29/23  Yes Samtani, Jai-Gurmukh, MD  glimepiride  (AMARYL ) 1 MG tablet Take 1 mg by mouth daily with breakfast.   Yes [provider]  hydrALAZINE  (APRESOLINE ) 100 MG tablet Take 1 tablet (100 mg total) by mouth 3 (three) times daily. Patient taking differently: Take 100 mg by mouth in the morning, at noon, and at bedtime. 10/26/23  Yes Fausto Sor LABOR, DO  hydrochlorothiazide  (HYDRODIURIL ) 25 MG tablet Take 25 mg by mouth daily in the afternoon. 09/06/23  Yes [provider]  losartan  (COZAAR ) 100 MG tablet Take 100 mg by mouth at bedtime.   Yes [provider]  acetaminophen  (TYLENOL ) 325 MG tablet Take 2 tablets (650 mg total) by mouth every 4 (four) hours as needed for mild pain (pain score 1-3) or fever (or temp > 37.5 C (99.5 F)). Patient not taking: Reported on 12/26/2023 10/26/23   Fausto Sor A, DO  artificial tears ophthalmic solution Place 1 drop into both eyes as needed for dry eyes. 12/29/23   Samtani, Jai-Gurmukh, MD  aspirin  81 MG chewable tablet [place]  1 tablet (81 mg total) by mouth daily. Patient taking differently: Chew 81 mg by mouth at bedtime. 10/27/23   Fausto Sor A, DO  gabapentin  (NEURONTIN ) 100 MG capsule Take 100 mg by mouth at bedtime. 10/02/23   [provider]  pantoprazole  (PROTONIX ) 40 MG tablet Take 1 tablet (40 mg total) by mouth daily. 12/29/23 02/27/24  Samtani, Jai-Gurmukh, MD  ticagrelor  (BRILINTA ) 90 MG TABS tablet Take 1 tablet (90 mg total) by mouth 2 (two) times daily. 12/29/23   Samtani,  Jai-Gurmukh, MD  valACYclovir  (VALTREX ) 1000 MG tablet Take 1 tablet (1,000 mg total) by mouth every 12 (twelve) hours. 12/29/23   Samtani, Jai-Gurmukh, MD  verapamil  (CALAN ) 80 MG tablet Take 1 tablet (80 mg total) by mouth every 8 (eight) hours. 10/26/23   Fausto Sor LABOR, DO    Family History Family History  Problem Relation Age of Onset   Diabetes Mother     Social History Social History[1]   Allergies   Patient has no known allergies.   Review of Systems Review of Systems  Respiratory:  Positive for chest tightness and shortness of breath.   Cardiovascular:  Positive for chest pain. Negative for leg swelling.     Physical Exam Triage Vital Signs ED Triage Vitals  Encounter Vitals Group     BP      Girls Systolic BP Percentile      Girls Diastolic BP Percentile      Boys Systolic BP Percentile      Boys Diastolic BP Percentile      Pulse      Resp      Temp      Temp src      SpO2      Weight      Height      Head Circumference      Peak Flow      Pain Score      Pain Loc      Pain Education      Exclude from Growth Chart    No data found.  Updated Vital Signs BP (!) 195/87 (BP Location: Left Arm)   Pulse 66   Temp (!) 97.5 F (36.4 C) (Oral)   Resp 20   SpO2 100%   Visual Acuity Right Eye Distance:   Left Eye Distance:   Bilateral Distance:    Right Eye Near:   Left Eye Near:    Bilateral Near:     Physical Exam Vitals reviewed.  Constitutional:      General: He is not in acute distress.    Appearance: Normal appearance. He is not ill-appearing or diaphoretic.  HENT:     Head: Normocephalic and atraumatic.  Eyes:     Comments: PERRLA, EOMI  Cardiovascular:     Rate and Rhythm: Normal rate and regular rhythm.     Heart sounds: Normal heart sounds.  Pulmonary:     Effort: Pulmonary effort is normal.     Breath sounds: Normal breath sounds.  Skin:    General: Skin is warm.  Neurological:     General: No focal deficit present.      Mental Status: He is alert and oriented to person, place, and time.     Comments: Strength 5/5 upper and lower extremities  Psychiatric:        Mood and Affect: Mood normal.        Behavior: Behavior normal.        Thought Content: Thought content normal.  Judgment: Judgment normal.      UC Treatments / Results  Labs (all labs ordered are listed, but only abnormal results are displayed) Labs Reviewed - No data to display  EKG   Radiology No results found.  Procedures Procedures (including critical care time)  Medications Ordered in UC Medications - No data to display  Initial Impression / Assessment and Plan / UC Course  I have reviewed the triage vital signs and the nursing notes.  Pertinent labs & imaging results that were available during my care of the patient were reviewed by me and considered in my medical decision making (see chart for details).     Patient is a 63 year old Spanish-speaking male presenting with chest pain and shortness of breath.  His symptoms are worsening over the last 24 hours.  He has an extensive cardiac history.  EKG with T wave abnormalities.  We discussed that he could be having a heart attack.  I advised him to head to the emergency department for this evaluation.  He verbalizes that he will not go, and he understands that he will be leaving AGAINST MEDICAL ADVICE.  He understands that if he returns home rather than to the emergency department, he could have a heart attack and die.  The wife is also present, and verbalizes understanding.  AMA form signed.  Final Clinical Impressions(s) / UC Diagnoses   Final diagnoses:  Atypical chest pain     Discharge Instructions      - You could be having a heart attack.  You need to go to the emergency room.  You need to go there immediately, and have your wife drive the car.  If you do not go to the emergency room, you understand that you could die of a heart attack or a stroke. -Podras  estar sufriendo un infarto. Necesitas ir a urgencias. Tienes que ir inmediatamente y que tu esposa conduzca el coche. Si no vas a urgencias, sabes que podras morir de un infarto o un derrame cerebral.     ED Prescriptions   None    PDMP not reviewed this encounter.    Arlyss Leita BRAVO, PA-C 03/30/24 1538     [1]  Social History Tobacco Use   Smoking status: Never   Smokeless tobacco: Never  Vaping Use   Vaping status: Never Used  Substance Use Topics   Alcohol  use: No   Drug use: No     Arlyss Leita BRAVO, PA-C 03/30/24 1545  "

## 2024-03-30 NOTE — Discharge Instructions (Addendum)
-   You could be having a heart attack.  You need to go to the emergency room.  You need to go there immediately, and have your wife drive the car.  If you do not go to the emergency room, you understand that you could die of a heart attack or a stroke. -Podras estar sufriendo un infarto. Necesitas ir a urgencias. Tienes que ir inmediatamente y que tu esposa conduzca el coche. Si no vas a urgencias, sabes que podras morir de un infarto o un derrame cerebral.

## 2024-04-02 NOTE — Discharge Summary (Signed)
 ------------------------------------------------------------------------------- Attestation signed by [facility], Richard Fellows, MD at 04/02/24 1633 Discharge Attestation: I saw and evaluated the patient, participating in the key portions of the service on the day of discharge.  I reviewed the resident's note and agree with the discharge plans and disposition. I personally spent 45 minutes in discharge planning services.    Pt was originally admitted for severe hypertension in outpatient setting. Upon review, it was discovered that patient is likely not adherent to outpatient home medication regimen. He was observed to return to near-normotensive BPs within 24 hours of uptitration of only some of his outpatient medications (a large driver of this suspected to be rebound hypertension from clonidine  cessation). He is under consideration for nephrectomy given his severe uncontrolled hypertension, however his hospital course suggests perhaps with medication optimization and adherence, he may be able to avoid this major surgery (which per chart review, comes with a high risk of progression to needing dialysis if kidney is removed).   Coreg  was reduced 2/2 bradycardia. Verapamil  (initially selected for potential dual benefit for tx of HA) was switched to nifedipine given bradycardia. Clonidine  was reduced from 0.3mg  patch to 0.1mg  patch and hydralazine  was held at discharge.   Strongly suggest additional assistance w/ medication adherence such as bubble packs or close f/up w/ PCP for in-person medication review.   Richard KANDICE Coyer, MD      -------------------------------------------------------------------------------   Physician Discharge Summary HBR 1 BT2 [facility] [address] [place] [address] Dept: [phone] Loc: [phone]   Identifying Information:  Richard Hunt 1961/01/05 [id number]  Primary Care Physician: [facility]  Code Status: Full Code  Admit Date:  03/31/2024  Discharge Date: 04/02/2024   Discharge To: Home  Discharge Service: HBR - FAM - [place]   Discharge Attending Physician: Richard Fellows Coyer, MD  Discharge Diagnoses: Principal Problem:   Secondary hypertension Active Problems:   Type II diabetes mellitus (CMS-HCC)   Stage 3b chronic kidney disease (CMS-HCC)   Cerebrovascular accident (CVA)    (CMS-HCC)   Renal artery stenosis   Pulmonary edema (HHS-HCC)   Dyspnea   Outpatient Provider Follow Up Issues:  [ ]  outpatient sleep study [ ]  consider BP med titration pending BP at follow up [ ]  follow up with nephrology/urology for possible nephrectomy  Hospital Course:  #Severe hypertension Patient with chronically severely elevated blood pressure 2/2 renal artery stenosis. Patient with inconsistent fill history for medications and unclear adherence. Symptoms (CP, SOB) are subacute-chronic.  His blood pressures have been better controlled after resuming his home medications.  Suspect elevated blood pressures were secondary to medication nonadherence.  His blood pressure is currently controlled with clonidine  0.1 mg 3 times a day, Coreg  12.5 mg twice a day, nifedipine 60 mg daily.  His hydralazine  has been held given well-controlled to low blood pressures with current regimen.  Recommend continuing this regimen at discharge with further titration of medications as appropriate.  Patient may benefit from weaning off clonidine  given possible nonadherence and risk for rebound hypertension. Recommend continued blood pressure control.  Recommend follow-up with urology and nephrology as scheduled for possible nephrectomy.  #Pulmonary congestion #dyspnea #Concern for OSA Patient initially presented with dyspnea and evidence of pulmonary congestion on chest x-ray.  He received 1 dose of 20 mg IV Lasix on 12/29 with adequate urine output.  He continues to report dyspnea however he has adequate oxygen saturations without tachypnea on room  air.  He did desat to 89% overnight requiring 2 L of oxygen.  High  suspicion for OSA contributing to shortness of breath and nocturnal hypoxemia.  6-minute walk test revealed no need for home oxygen.  Recommend outpatient sleep study for evaluation of OSA.   #Chest pain Stable, chronic mild chest pain. Possibly 2/2 severe HTN or else non-emergent condition (e.g., has a history of GERD).  Troponins were negative and EKG without evidence of ischemic changes.  Continue treatment of GERD with omeprazole.  #hx of CVA Continued home meds and started Zetia 10 mg nightly given LDL 104.   #CKD3b Creatinine has remained at baseline during this admission.  Recommend continued blood pressure control.  Recommend follow-up with urology and nephrology as scheduled for possible nephrectomy.   Touchbase with Outpatient Provider: Warm Handoff: Completed on 04/02/2024 by Alyce SHAUNNA Pipes, MD  (Resident) via [facility]  Procedures: None No admission procedures for hospital encounter. ______________________________________________________________________ Discharge Medications:   Your Medication List     STOP taking these medications    cloNIDine  0.3 mg/24 hr Commonly known as: CATAPRES -TTS Replaced by: cloNIDine  0.1 mg/24 hr   hydrALAZINE  100 MG tablet Commonly known as: APRESOLINE    verapamil  120 MG tablet Commonly known as: CALAN        START taking these medications    cloNIDine  0.1 mg/24 hr Commonly known as: CATAPRES -TTS Place 1 patch on the skin once a week. Replaces: cloNIDine  0.3 mg/24 hr   ezetimibe 10 mg tablet Commonly known as: ZETIA Take 1 tablet (10 mg total) by mouth nightly.   NIFEdipine 60 MG 24 hr tablet Commonly known as: PROCARDIA XL Take 1 tablet (60 mg total) by mouth daily. Start taking on: April 03, 2024       CHANGE how you take these medications    carvedilol  12.5 MG tablet Commonly known as: COREG  Take 1 tablet (12.5 mg total) by mouth two (2)  times a day. What changed:  medication strength how much to take       CONTINUE taking these medications    aspirin  81 MG tablet Commonly known as: ECOTRIN Take 1 tablet (81 mg total) by mouth daily.   atorvastatin  80 MG tablet Commonly known as: LIPITOR  Take 1 tablet (80 mg total) by mouth daily.   docusate sodium  100 MG capsule Commonly known as: COLACE Take 1 capsule (100 mg total) by mouth two (2) times a day as needed for constipation.   glimepiride  1 MG tablet Commonly known as: AMARYL  Take 1 tablet (1 mg total) by mouth daily before breakfast.   KERENDIA 10 mg Tab Generic drug: finerenone Take 1 tablet by mouth daily.   omeprazole 40 MG capsule Commonly known as: PriLOSEC Take 1 capsule (40 mg total) by mouth daily.   pregabalin 25 MG capsule Commonly known as: LYRICA Take 1 capsule (25 mg total) by mouth two (2) times a day.   ticagrelor  90 mg Tab Commonly known as: BRILINTA  Take 1 tablet (90 mg total) by mouth two (2) times a day.        Allergies: Patient has no known allergies. ______________________________________________________________________ Pending Test Results (if blank, then none):   Most Recent Labs: All lab results last 24 hours -  Recent Results (from the past 24 hours)  POCT Glucose   Collection Time: 04/01/24  4:15 PM  Result Value Ref Range   Glucose, POC 191 (H) 70 - 179 mg/dL  POCT Glucose   Collection Time: 04/01/24  8:08 PM  Result Value Ref Range   Glucose, POC 140 70 - 179 mg/dL  Basic Metabolic  Panel   Collection Time: 04/02/24  7:37 AM  Result Value Ref Range   Sodium 139 135 - 145 mmol/L   Potassium 4.1 3.5 - 5.1 mmol/L   Chloride 101 98 - 107 mmol/L   CO2 21.1 20.0 - 31.0 mmol/L   Anion Gap 17 (H) 5 - 14 mmol/L   BUN 34 (H) 9 - 23 mg/dL   Creatinine 7.50 (H) 9.26 - 1.18 mg/dL   BUN/Creatinine Ratio 14    eGFR CKD-EPI (2021) Male 28 (L) >=60 mL/min/1.65m2   Glucose 127 70 - 179 mg/dL   Calcium  8.7 8.7 -  10.4 mg/dL  CBC   Collection Time: 04/02/24  7:37 AM  Result Value Ref Range   WBC 8.1 3.6 - 11.2 10*9/L   RBC 4.33 4.26 - 5.60 10*12/L   HGB 13.2 12.9 - 16.5 g/dL   HCT 62.3 (L) 60.9 - 51.9 %   MCV 87.0 77.6 - 95.7 fL   MCH 30.6 25.9 - 32.4 pg   MCHC 35.2 32.0 - 36.0 g/dL   RDW 86.7 87.7 - 84.7 %   MPV 8.2 6.8 - 10.7 fL   Platelet 246 150 - 450 10*9/L  POCT Glucose   Collection Time: 04/02/24  8:18 AM  Result Value Ref Range   Glucose, POC 117 70 - 179 mg/dL    Relevant Studies/Radiology (if blank, then none): ECG 12 Lead Result Date: 04/01/2024 SINUS BRADYCARDIA ST & T WAVE ABNORMALITY, CONSIDER INFEROLATERAL ISCHEMIA PROLONGED QT ABNORMAL ECG WHEN COMPARED WITH ECG OF 31-Mar-2024 06:48, VENT. RATE HAS DECREASED by  27 bpm INVERTED T WAVES HAVE REPLACED NONSPECIFIC T WAVE ABNORMALITY IN INFERIOR LEADS  ECG 12 Lead Result Date: 03/31/2024 NORMAL SINUS RHYTHM LEFT VENTRICULAR HYPERTROPHY WITH REPOLARIZATION ABNORMALITY ( Cornell product ) ABNORMAL ECG WHEN COMPARED WITH ECG OF 31-Mar-2024 03:13, NO SIGNIFICANT CHANGE WAS FOUND Confirmed by Antonetta Gull ([id number]) on 03/31/2024 1:52:17 PM  ECG 12 Lead Result Date: 03/31/2024 NORMAL SINUS RHYTHM POSSIBLE ANTERIOR INFARCT  , AGE UNDETERMINED T WAVE ABNORMALITY, CONSIDER LATERAL ISCHEMIA ABNORMAL ECG WHEN COMPARED WITH ECG OF 30-Mar-2024 23:26, NO SIGNFICANT CHANGE Confirmed by Antonetta Gull ([id number]) on 03/31/2024 1:50:40 PM  XR Chest 2 views Result Date: 03/31/2024 EXAM: XR CHEST 2 VIEWS ACCESSION: [id number] UN REPORT DATE: 03/31/2024 12:05 AM CLINICAL INDICATION: CHEST PAIN ; Chest Pain  TECHNIQUE: PA and Lateral Chest Radiographs. COMPARISON: 07/24/2020 FINDINGS: Unchanged cardiac silhouette size. Pulmonary vascular congestion. No pulmonary consolidation, sizable pleural effusion, or pneumothorax. Visualized osseous structures are intact. IMPRESSION: Pulmonary vascular congestion. No pulmonary consolidation.    ______________________________________________________________________ Discharge Instructions:      Other Instructions     Discharge instructions     You were admitted to [facility] for chest pain and shortness of breath.  Please carefully read and follow these instructions below upon your discharge:  1) Please take your medications as prescribed and note the changes listed on your discharge. At future follow-up appointments, please be sure to take all of your medications with you so your provider can better guide your care.   2) Seek medical care with your primary care doctor or local Emergency Room or Urgent Care if you develop any changes in your mental status, worsening abdominal pain, fevers greater than 101.5, any unexplained/unrelieved shortness of breath, uncontrolled nausea and vomiting that keeps you from remaining hydrated or taking your medication, or any other concerning symptoms.   3) Please go to your follow-up appointments. Some of your follow-up appointments have been  listed below. If you do not see an appointment listed below with your primary care doctor, please call your doctor's office as soon as possible to schedule an appointment to be seen within 7-10 days of discharge.   4) If you have any concerns before you are able to follow-up with your primary care doctor, you can reach us  by calling [phone].          Follow Up instructions and Outpatient Referrals    Discharge instructions       Appointments which have been scheduled for you    Apr 14, 2024 3:00 PM Appointment with Margaretann Seip, FNP [address] [place], [place] [address]   In person hospital follow up appointment at [facility] Phone: [phone]   Apr 30, 2024 2:25 PM (Arrive by 2:00 PM) NEW NEUROLOGY with Penne LITTIE Boone, DO [facility] NEUROLOGY CLINIC [address] [place] [facility] REGION) [address] [place] [address] [phone]     May 12, 2024 4:00 PM (Arrive by 3:45 PM) TELEPHONE with Clorinda Ole Chalk, MD [facility] UROLOGY SERVICE [place] [facility]) [address] [facility] 1 through 4 [place] [address] [phone]     Jun 04, 2024 4:00 PM (Arrive by 3:35 PM) RETURN STROKE with Alan CHRISTELLA Carwin, MD [facility] NEUROLOGY CLINIC [address] [place] [facility] REGION) [address] [place] [address] [phone]     Jul 23, 2024 8:40 AM RETURN CARDIOLOGY with Margery Ruth, MD [facility] CARDIOLOGY AT [facility] REGION) [address] [place] [address] [phone]     Nov 20, 2024 10:00 AM (Arrive by 9:30 AM) VASCULAR ULTRASOUND CAROTID DUPLEX BILATERAL with [facility]L OUTPATIENT 2 IMG PVL [facility]) [address] [place] [address] [phone]     Nov 20, 2024 11:15 AM (Arrive by 10:45 AM) RETURN VASCULAR with Oneil Juliene Phlegm, MD [facility] VASCULAR SURGERY [place] [facility] REGION) [address] [place] [address] [phone]        ______________________________________________________________________ Discharge Day Services: BP (!) 151/82   Pulse 69   Temp 36.4 C (97.5 F) (Oral)   Resp 22   Ht 165.1 cm (5' 5)   Wt 92.4 kg (203 lb 11.3 oz)   SpO2 100%   BMI 33.90 kg/m  Pt seen on the day of discharge and determined appropriate for discharge.  GEN: well appearing, lying in bed, NAD  HEENT: NCAT, MMM. EOMI. Neck: Supple. CV: Regular rate and rhythm. No murmurs/rubs/gallops. Pulm: CTAB. No wheezing, crackles, or rhonchi. Abd: Flat.  Nontender. No guarding, rebound.  Normoactive bowel sounds.   Neuro: A&O x 3. No focal deficits.  Ext: No peripheral edema.  Palpable distal pulses.  Condition at Discharge: good  Length of Discharge: I spent greater than 30 mins in  the discharge of this patient.

## 2024-04-03 ENCOUNTER — Observation Stay

## 2024-04-03 ENCOUNTER — Emergency Department

## 2024-04-03 ENCOUNTER — Other Ambulatory Visit: Payer: Self-pay

## 2024-04-03 ENCOUNTER — Inpatient Hospital Stay
Admission: EM | Admit: 2024-04-03 | Discharge: 2024-04-07 | DRG: 291 | Disposition: A | Discharge status: Home / Self Care | Attending: Internal Medicine | Admitting: Internal Medicine

## 2024-04-03 DIAGNOSIS — Z87891 Personal history of nicotine dependence: Secondary | ICD-10-CM

## 2024-04-03 DIAGNOSIS — Q613 Polycystic kidney, unspecified: Secondary | ICD-10-CM

## 2024-04-03 DIAGNOSIS — Z9049 Acquired absence of other specified parts of digestive tract: Secondary | ICD-10-CM

## 2024-04-03 DIAGNOSIS — I639 Cerebral infarction, unspecified: Secondary | ICD-10-CM | POA: Diagnosis present

## 2024-04-03 DIAGNOSIS — Z7902 Long term (current) use of antithrombotics/antiplatelets: Secondary | ICD-10-CM

## 2024-04-03 DIAGNOSIS — R600 Localized edema: Secondary | ICD-10-CM | POA: Diagnosis present

## 2024-04-03 DIAGNOSIS — J810 Acute pulmonary edema: Secondary | ICD-10-CM

## 2024-04-03 DIAGNOSIS — R7989 Other specified abnormal findings of blood chemistry: Secondary | ICD-10-CM

## 2024-04-03 DIAGNOSIS — I150 Renovascular hypertension: Secondary | ICD-10-CM | POA: Diagnosis present

## 2024-04-03 DIAGNOSIS — E785 Hyperlipidemia, unspecified: Secondary | ICD-10-CM | POA: Diagnosis present

## 2024-04-03 DIAGNOSIS — Z794 Long term (current) use of insulin: Secondary | ICD-10-CM

## 2024-04-03 DIAGNOSIS — K219 Gastro-esophageal reflux disease without esophagitis: Secondary | ICD-10-CM | POA: Diagnosis present

## 2024-04-03 DIAGNOSIS — Z833 Family history of diabetes mellitus: Secondary | ICD-10-CM

## 2024-04-03 DIAGNOSIS — I5033 Acute on chronic diastolic (congestive) heart failure: Secondary | ICD-10-CM | POA: Diagnosis present

## 2024-04-03 DIAGNOSIS — E119 Type 2 diabetes mellitus without complications: Secondary | ICD-10-CM

## 2024-04-03 DIAGNOSIS — R0789 Other chest pain: Secondary | ICD-10-CM

## 2024-04-03 DIAGNOSIS — E1122 Type 2 diabetes mellitus with diabetic chronic kidney disease: Secondary | ICD-10-CM | POA: Diagnosis present

## 2024-04-03 DIAGNOSIS — R0602 Shortness of breath: Principal | ICD-10-CM

## 2024-04-03 DIAGNOSIS — E7800 Pure hypercholesterolemia, unspecified: Secondary | ICD-10-CM | POA: Diagnosis present

## 2024-04-03 DIAGNOSIS — I5031 Acute diastolic (congestive) heart failure: Secondary | ICD-10-CM

## 2024-04-03 DIAGNOSIS — I110 Hypertensive heart disease with heart failure: Principal | ICD-10-CM | POA: Diagnosis present

## 2024-04-03 DIAGNOSIS — I2510 Atherosclerotic heart disease of native coronary artery without angina pectoris: Secondary | ICD-10-CM | POA: Diagnosis present

## 2024-04-03 DIAGNOSIS — Z7982 Long term (current) use of aspirin: Secondary | ICD-10-CM

## 2024-04-03 DIAGNOSIS — Z8673 Personal history of transient ischemic attack (TIA), and cerebral infarction without residual deficits: Secondary | ICD-10-CM

## 2024-04-03 DIAGNOSIS — I509 Heart failure, unspecified: Secondary | ICD-10-CM

## 2024-04-03 DIAGNOSIS — N184 Chronic kidney disease, stage 4 (severe): Secondary | ICD-10-CM | POA: Diagnosis present

## 2024-04-03 DIAGNOSIS — N261 Atrophy of kidney (terminal): Secondary | ICD-10-CM | POA: Diagnosis present

## 2024-04-03 DIAGNOSIS — Z7984 Long term (current) use of oral hypoglycemic drugs: Secondary | ICD-10-CM

## 2024-04-03 DIAGNOSIS — I2489 Other forms of acute ischemic heart disease: Secondary | ICD-10-CM | POA: Diagnosis present

## 2024-04-03 DIAGNOSIS — Z79899 Other long term (current) drug therapy: Secondary | ICD-10-CM

## 2024-04-03 DIAGNOSIS — I701 Atherosclerosis of renal artery: Secondary | ICD-10-CM | POA: Diagnosis present

## 2024-04-03 LAB — CBG MONITORING, ED
Glucose-Capillary: 188 mg/dL — ABNORMAL HIGH (ref 70–99)
Glucose-Capillary: 152 mg/dL — ABNORMAL HIGH (ref 70–99)
Glucose-Capillary: 126 mg/dL — ABNORMAL HIGH (ref 70–99)

## 2024-04-03 LAB — CBC WITH DIFFERENTIAL/PLATELET
Abs Immature Granulocytes: 0.08 K/uL — ABNORMAL HIGH (ref 0.00–0.07)
Basophils Absolute: 0.1 K/uL (ref 0.0–0.1)
Basophils Relative: 1 %
Eosinophils Absolute: 0.2 K/uL (ref 0.0–0.5)
Eosinophils Relative: 2 %
HCT: 38.7 % — ABNORMAL LOW (ref 39.0–52.0)
Hemoglobin: 13.7 g/dL (ref 13.0–17.0)
Immature Granulocytes: 1 %
Lymphocytes Relative: 17 %
Lymphs Abs: 1.8 K/uL (ref 0.7–4.0)
MCH: 30.3 pg (ref 26.0–34.0)
MCHC: 35.4 g/dL (ref 30.0–36.0)
MCV: 85.6 fL (ref 80.0–100.0)
Monocytes Absolute: 0.6 K/uL (ref 0.1–1.0)
Monocytes Relative: 6 %
Neutro Abs: 8 K/uL — ABNORMAL HIGH (ref 1.7–7.7)
Neutrophils Relative %: 73 %
Platelets: 295 K/uL (ref 150–400)
RBC: 4.52 MIL/uL (ref 4.22–5.81)
RDW: 12.2 % (ref 11.5–15.5)
WBC: 10.8 K/uL — ABNORMAL HIGH (ref 4.0–10.5)
nRBC: 0 % (ref 0.0–0.2)

## 2024-04-03 LAB — TROPONIN T, HIGH SENSITIVITY
Troponin T High Sensitivity: 46 ng/L — ABNORMAL HIGH (ref 0–19)
Troponin T High Sensitivity: 45 ng/L — ABNORMAL HIGH (ref 0–19)

## 2024-04-03 LAB — COMPREHENSIVE METABOLIC PANEL WITH GFR
ALT: 17 U/L (ref 0–44)
AST: 17 U/L (ref 15–41)
Albumin: 4.1 g/dL (ref 3.5–5.0)
Alkaline Phosphatase: 144 U/L — ABNORMAL HIGH (ref 38–126)
Anion gap: 15 (ref 5–15)
BUN: 41 mg/dL — ABNORMAL HIGH (ref 8–23)
CO2: 19 mmol/L — ABNORMAL LOW (ref 22–32)
Calcium: 9.2 mg/dL (ref 8.9–10.3)
Chloride: 96 mmol/L — ABNORMAL LOW (ref 98–111)
Creatinine, Ser: 3.16 mg/dL — ABNORMAL HIGH (ref 0.61–1.24)
GFR, Estimated: 21 mL/min — ABNORMAL LOW
Glucose, Bld: 194 mg/dL — ABNORMAL HIGH (ref 70–99)
Potassium: 4.2 mmol/L (ref 3.5–5.1)
Sodium: 130 mmol/L — ABNORMAL LOW (ref 135–145)
Total Bilirubin: 0.6 mg/dL (ref 0.0–1.2)
Total Protein: 7.6 g/dL (ref 6.5–8.1)

## 2024-04-03 LAB — CREATININE, SERUM
Creatinine, Ser: 2.97 mg/dL — ABNORMAL HIGH (ref 0.61–1.24)
GFR, Estimated: 23 mL/min — ABNORMAL LOW

## 2024-04-03 LAB — CBC
HCT: 35.9 % — ABNORMAL LOW (ref 39.0–52.0)
Hemoglobin: 13 g/dL (ref 13.0–17.0)
MCH: 30.9 pg (ref 26.0–34.0)
MCHC: 36.2 g/dL — ABNORMAL HIGH (ref 30.0–36.0)
MCV: 85.3 fL (ref 80.0–100.0)
Platelets: 280 K/uL (ref 150–400)
RBC: 4.21 MIL/uL — ABNORMAL LOW (ref 4.22–5.81)
RDW: 12.2 % (ref 11.5–15.5)
WBC: 10.3 K/uL (ref 4.0–10.5)
nRBC: 0 % (ref 0.0–0.2)

## 2024-04-03 LAB — RESP PANEL BY RT-PCR (RSV, FLU A&B, COVID)  RVPGX2
Influenza A by PCR: NEGATIVE
Influenza B by PCR: NEGATIVE
Resp Syncytial Virus by PCR: NEGATIVE
SARS Coronavirus 2 by RT PCR: NEGATIVE

## 2024-04-03 LAB — D-DIMER, QUANTITATIVE: D-Dimer, Quant: 0.8 ug{FEU}/mL — ABNORMAL HIGH (ref 0.00–0.50)

## 2024-04-03 LAB — PRO BRAIN NATRIURETIC PEPTIDE: Pro Brain Natriuretic Peptide: 1064 pg/mL — ABNORMAL HIGH

## 2024-04-03 MED ORDER — EZETIMIBE 10 MG PO TABS
10.0000 mg | ORAL_TABLET | Freq: Every day | ORAL | Status: DC
  Administered 2024-04-03 – 2024-04-06 (×4): 10 mg via ORAL
  Filled 2024-04-03 (×4): qty 1

## 2024-04-03 MED ORDER — VERAPAMIL HCL 80 MG PO TABS: 80.0000 mg | ORAL_TABLET | Freq: Three times a day (TID) | ORAL | Status: DC

## 2024-04-03 MED ORDER — ACETAMINOPHEN 325 MG PO TABS: 650.0000 mg | ORAL_TABLET | Freq: Four times a day (QID) | ORAL | Status: DC | PRN

## 2024-04-03 MED ORDER — NIFEDIPINE ER 60 MG PO TB24
60.0000 mg | ORAL_TABLET | Freq: Every day | ORAL | Status: DC
  Administered 2024-04-03 – 2024-04-07 (×5): 60 mg via ORAL
  Filled 2024-04-03 (×5): qty 1

## 2024-04-03 MED ORDER — ASPIRIN 81 MG PO CHEW
324.0000 mg | CHEWABLE_TABLET | Freq: Once | ORAL | Status: AC
  Administered 2024-04-03: 324 mg via ORAL
  Filled 2024-04-03: qty 4

## 2024-04-03 MED ORDER — ONDANSETRON HCL 4 MG/2ML IJ SOLN: 4.0000 mg | Freq: Four times a day (QID) | INTRAMUSCULAR | Status: DC | PRN

## 2024-04-03 MED ORDER — INSULIN ASPART 100 UNIT/ML IJ SOLN
0.0000 [IU] | Freq: Three times a day (TID) | INTRAMUSCULAR | Status: DC
  Administered 2024-04-03 (×2): 1 [IU] via SUBCUTANEOUS
  Administered 2024-04-04: 2 [IU] via SUBCUTANEOUS
  Administered 2024-04-04 (×2): 1 [IU] via SUBCUTANEOUS
  Filled 2024-04-03 (×3): qty 1
  Filled 2024-04-03: qty 2
  Filled 2024-04-03: qty 1
  Filled 2024-04-03: qty 2
  Filled 2024-04-03: qty 1
  Filled 2024-04-03: qty 2
  Filled 2024-04-03: qty 3

## 2024-04-03 MED ORDER — FUROSEMIDE 10 MG/ML IJ SOLN
40.0000 mg | Freq: Two times a day (BID) | INTRAMUSCULAR | Status: AC
  Administered 2024-04-03 – 2024-04-04 (×2): 40 mg via INTRAVENOUS
  Filled 2024-04-03 (×2): qty 4

## 2024-04-03 MED ORDER — TICAGRELOR 90 MG PO TABS
90.0000 mg | ORAL_TABLET | Freq: Two times a day (BID) | ORAL | Status: DC
  Administered 2024-04-03 – 2024-04-07 (×9): 90 mg via ORAL
  Filled 2024-04-03 (×9): qty 1

## 2024-04-03 MED ORDER — INSULIN ASPART 100 UNIT/ML IJ SOLN
0.0000 [IU] | Freq: Every day | INTRAMUSCULAR | Status: DC
  Administered 2024-04-04 – 2024-04-05 (×2): 2 [IU] via SUBCUTANEOUS
  Filled 2024-04-03 (×2): qty 2

## 2024-04-03 MED ORDER — ASPIRIN 81 MG PO CHEW
81.0000 mg | CHEWABLE_TABLET | Freq: Every day | ORAL | Status: DC
  Administered 2024-04-04 – 2024-04-06 (×3): 81 mg via ORAL
  Filled 2024-04-03 (×3): qty 1

## 2024-04-03 MED ORDER — POLYVINYL ALCOHOL 1.4 % OP SOLN: 1.0000 [drp] | OPHTHALMIC | Status: DC | PRN

## 2024-04-03 MED ORDER — ACETAMINOPHEN 650 MG RE SUPP: 650.0000 mg | Freq: Four times a day (QID) | RECTAL | Status: DC | PRN

## 2024-04-03 MED ORDER — ONDANSETRON HCL 4 MG PO TABS: 4.0000 mg | ORAL_TABLET | Freq: Four times a day (QID) | ORAL | Status: DC | PRN

## 2024-04-03 MED ORDER — FUROSEMIDE 10 MG/ML IJ SOLN
40.0000 mg | Freq: Once | INTRAMUSCULAR | Status: AC
  Administered 2024-04-03: 40 mg via INTRAVENOUS
  Filled 2024-04-03: qty 4

## 2024-04-03 MED ORDER — POLYETHYLENE GLYCOL 3350 17 G PO PACK
17.0000 g | PACK | Freq: Every day | ORAL | Status: DC | PRN
  Administered 2024-04-04: 17 g via ORAL
  Filled 2024-04-03: qty 1

## 2024-04-03 MED ORDER — OXYCODONE HCL 5 MG PO TABS: 5.0000 mg | ORAL_TABLET | ORAL | Status: DC | PRN

## 2024-04-03 MED ORDER — CLONIDINE HCL 0.1 MG PO TABS: 0.1000 mg | ORAL_TABLET | Freq: Every day | ORAL | Status: DC | PRN

## 2024-04-03 MED ORDER — PANTOPRAZOLE SODIUM 40 MG PO TBEC
40.0000 mg | DELAYED_RELEASE_TABLET | Freq: Every day | ORAL | Status: DC
  Administered 2024-04-03 – 2024-04-07 (×5): 40 mg via ORAL
  Filled 2024-04-03 (×5): qty 1

## 2024-04-03 MED ORDER — PREGABALIN 25 MG PO CAPS
25.0000 mg | ORAL_CAPSULE | Freq: Two times a day (BID) | ORAL | Status: DC
  Administered 2024-04-03 – 2024-04-07 (×9): 25 mg via ORAL
  Filled 2024-04-03 (×9): qty 1

## 2024-04-03 MED ORDER — ATORVASTATIN CALCIUM 80 MG PO TABS
80.0000 mg | ORAL_TABLET | Freq: Every day | ORAL | Status: DC
  Administered 2024-04-03 – 2024-04-06 (×4): 80 mg via ORAL
  Filled 2024-04-03 (×3): qty 1
  Filled 2024-04-03: qty 4

## 2024-04-03 MED ORDER — HEPARIN SODIUM (PORCINE) 5000 UNIT/ML IJ SOLN
5000.0000 [IU] | Freq: Three times a day (TID) | INTRAMUSCULAR | Status: DC
  Administered 2024-04-03 – 2024-04-07 (×12): 5000 [IU] via SUBCUTANEOUS
  Filled 2024-04-03 (×12): qty 1

## 2024-04-03 MED ORDER — CARVEDILOL 12.5 MG PO TABS
12.5000 mg | ORAL_TABLET | Freq: Two times a day (BID) | ORAL | Status: DC
  Administered 2024-04-03 – 2024-04-07 (×9): 12.5 mg via ORAL
  Filled 2024-04-03: qty 2
  Filled 2024-04-03: qty 1
  Filled 2024-04-03: qty 2
  Filled 2024-04-03 (×6): qty 1

## 2024-04-03 MED ORDER — MORPHINE SULFATE (PF) 2 MG/ML IV SOLN: 2.0000 mg | INTRAVENOUS | Status: DC | PRN

## 2024-04-03 NOTE — H&P (Signed)
 "  History and Physical    Richard Hunt FMW:[id number] DOB: Apr 16, 1960 DOA: 04/03/2024  DOS: the patient was seen and examined on 04/03/2024  PCP: Cletus Glenn   Patient coming from: Home  I have personally briefly reviewed patient's old medical records in [facility]  Chief Complaint: Shortness of breath  HPI: Richard Hunt is a pleasant 64 y.o. 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  Patient was recently admitted and discharged from [facility] on 04/02/2024 after being treated for the same problem.  He had a severe hypertension, pulmonary edema and treated with diuresis and blood pressure was controlled breathing was improved and discharged home.  Patient stated that when he goes home he had the similar problem only improves for 6 hours.  And he was not able to breathe this morning and called the ambulance for evaluation.  He stated that he has been taking his prescription medications.  He denies any fever, chills, nausea, vomiting, chest pain, palpitations.  He denies any dysuria, hematuria, diarrhea.  ED Course: Upon arrival to the ED, patient is found to be in pulmonary edema, chest x-ray showed pulmonary edema, received Lasix 40 mg IV, aspirin  324 mg.  His D-dimer was elevated but unable to get CT angio due to chronic kidney failure.  VQ scan is not revealing due to congestive changes in his lungs too.  He is not hypoxemic right now.  His vital signs are stable.  Hospitalist service was consulted for evaluation for admission for uncontrolled hypertension and pulmonary edema.  Review of Systems:  ROS  All other systems negative except as noted in the  HPI.  Past Medical History:  Diagnosis Date   Acute cholecystitis 12/11/2016   Acute kidney injury (nontraumatic)    Bilateral edema of lower extremity 01/02/2014   Chronic venous insufficiency 01/16/2014   Diabetes (HCC) 12/11/2016   Diabetes mellitus without complication (HCC)    GERD (gastroesophageal reflux disease)    HLD (hyperlipidemia)    HTN (hypertension) 12/11/2016   Hypercholesterolemia    Hypertension    Osteoarthritis    Polycystic kidney disease    Ulcer of calf (HCC) 08/29/2013    Past Surgical History:  Procedure Laterality Date   CHOLECYSTECTOMY N/A 12/11/2016   Procedure: LAPAROSCOPIC CHOLECYSTECTOMY;  Surgeon: Shelva Dunnings, MD;  Location: [facility] ORS;  Service: General;  Laterality: N/A;   VEIN SURGERY       reports that he has never smoked. He has never used smokeless tobacco. He reports that he does not drink alcohol  and does not use drugs.  Allergies[1]  Family History  Problem Relation Age of Onset   Diabetes Mother     Prior to Admission medications  Medication Sig Start Date End Date Taking? Authorizing Provider  artificial tears ophthalmic solution Place 1 drop into both eyes as needed for dry eyes. 12/29/23  Yes Samtani, Jai-Gurmukh, MD  aspirin  81 MG chewable tablet Chew 1 tablet (81 mg total) by mouth daily. Patient taking differently: Chew 81 mg by mouth at bedtime. 10/27/23  Yes Fausto Sor A, DO  atorvastatin  (LIPITOR ) 80 MG tablet Take 1 tablet (80 mg total) by mouth at bedtime. 10/26/23  Yes Fausto Sor A, DO  carvedilol  (COREG ) 12.5 MG tablet Take  12.5 mg by mouth 2 (two) times daily.   Yes [provider]  cloNIDine  (CATAPRES  - DOSED IN MG/24 HR) 0.1 mg/24hr patch Place 1 patch onto the skin once a week. 04/02/24 07/01/24 Yes [provider]  Docusate Sodium  (DSS) 100 MG CAPS Take 100 mg by mouth 2 (two) times daily as needed. 01/29/24  Yes [provider]  ezetimibe (ZETIA) 10 MG tablet Take 10 mg by mouth at  bedtime. 04/02/24 07/01/24 Yes [provider]  glimepiride  (AMARYL ) 1 MG tablet Take 1 mg by mouth daily with breakfast.   Yes [provider]  KERENDIA 10 MG TABS Take 1 tablet by mouth daily. 01/07/24  Yes [provider]  NIFEdipine (PROCARDIA XL/NIFEDICAL XL) 60 MG 24 hr tablet Take 60 mg by mouth daily. 04/03/24 05/08/25 Yes [provider]  omeprazole (PRILOSEC) 40 MG capsule Take 40 mg by mouth daily.   Yes [provider]  pregabalin (LYRICA) 25 MG capsule Take 25 mg by mouth 2 (two) times daily. 02/27/24  Yes [provider]  ticagrelor  (BRILINTA ) 90 MG TABS tablet Take 1 tablet (90 mg total) by mouth 2 (two) times daily. 12/29/23  Yes Samtani, Jai-Gurmukh, MD  acetaminophen  (TYLENOL ) 325 MG tablet Take 2 tablets (650 mg total) by mouth every 4 (four) hours as needed for mild pain (pain score 1-3) or fever (or temp > 37.5 C (99.5 F)). Patient not taking: Reported on 12/26/2023 10/26/23   Fausto Sor A, DO  cloNIDine  (CATAPRES ) 0.1 MG tablet Take 1 tablet (0.1 mg total) by mouth daily as needed (bP >180 systolic). Patient not taking: Reported on 04/03/2024 12/29/23   Samtani, Jai-Gurmukh, MD  gabapentin  (NEURONTIN ) 100 MG capsule Take 100 mg by mouth at bedtime. Patient not taking: Reported on 04/03/2024 10/02/23   [provider]  hydrALAZINE  (APRESOLINE ) 100 MG tablet Take 1 tablet (100 mg total) by mouth 3 (three) times daily. Patient not taking: Reported on 04/03/2024 10/26/23   Fausto Sor A, DO  hydrochlorothiazide  (HYDRODIURIL ) 25 MG tablet Take 25 mg by mouth daily in the afternoon. Patient not taking: Reported on 04/03/2024 09/06/23   [provider]  losartan  (COZAAR ) 100 MG tablet Take 100 mg by mouth at bedtime. Patient not taking: Reported on 04/03/2024    [provider]  valACYclovir  (VALTREX ) 1000 MG tablet Take 1 tablet (1,000 mg total) by mouth every 12 (twelve) hours. Patient not taking: Reported on  04/03/2024 12/29/23   Samtani, Jai-Gurmukh, MD  verapamil  (CALAN ) 80 MG tablet Take 1 tablet (80 mg total) by mouth every 8 (eight) hours. Patient not taking: Reported on 04/03/2024 10/26/23   Fausto Sor LABOR, DO    Physical Exam: Vitals:   04/03/24 0630 04/03/24 0728 04/03/24 0728 04/03/24 1130  BP: 137/72  138/71 (!) 159/75  Pulse: 66  63 75  Resp: 11  19   Temp:      SpO2: 94% 91% 92%     Physical Exam   Constitutional: Alert, awake, calm, comfortable HEENT: Neck supple Respiratory: Bilateral fine basal rales, no wheezing or rhonchi Cardiovascular: Regular rate and rhythm, no murmurs / rubs / gallops. No extremity edema. 2+ pedal pulses. No carotid bruits.  Abdomen: Soft, no tenderness, Bowel sounds positive.  Musculoskeletal: no clubbing / cyanosis. Good ROM, no contractures. Normal muscle tone.  Skin: no rashes, lesions, ulcers. Neurologic: CN 2-12 grossly intact. Sensation intact, No focal deficit identified Psychiatric: Alert and oriented x 3. Normal mood.    Labs on Admission:  I have personally reviewed following labs and imaging studies  CBC: Recent Labs  Lab 04/03/24 0635 04/03/24 1030  WBC 10.8* 10.3  NEUTROABS 8.0*  --   HGB 13.7 13.0  HCT 38.7* 35.9*  MCV 85.6 85.3  PLT 295 280   Basic Metabolic Panel: Recent Labs  Lab 04/03/24 0635 04/03/24 1030  NA 130*  --   K 4.2  --   CL 96*  --   CO2 19*  --   GLUCOSE 194*  --   BUN 41*  --   CREATININE 3.16* 2.97*  CALCIUM  9.2  --    GFR: CrCl cannot be calculated (Unknown ideal weight.). Liver Function Tests: Recent Labs  Lab 04/03/24 0635  AST 17  ALT 17  ALKPHOS 144*  BILITOT 0.6  PROT 7.6  ALBUMIN 4.1   No results for input(s): LIPASE, AMYLASE in the last 168 hours. No results for input(s): AMMONIA in the last 168 hours. Coagulation Profile: No results for input(s): INR, PROTIME in the last 168 hours. Cardiac Enzymes: No results for input(s): CKTOTAL, CKMB, CKMBINDEX,  TROPONINI, TROPONINIHS in the last 168 hours. BNP (last 3 results) No results for input(s): BNP in the last 8760 hours. HbA1C: No results for input(s): HGBA1C in the last 72 hours. CBG: Recent Labs  Lab 04/03/24 1126  GLUCAP 188*   Lipid Profile: No results for input(s): CHOL, HDL, LDLCALC, TRIG, CHOLHDL, LDLDIRECT in the last 72 hours. Thyroid Function Tests: No results for input(s): TSH, T4TOTAL, FREET4, T3FREE, THYROIDAB in the last 72 hours. Anemia Panel: No results for input(s): VITAMINB12, FOLATE, FERRITIN, TIBC, IRON, RETICCTPCT in the last 72 hours. Urine analysis:    Component Value Date/Time   COLORURINE YELLOW (A) 10/22/2023 2040   APPEARANCEUR CLEAR (A) 10/22/2023 2040   APPEARANCEUR Hazy 06/27/2011 0919   LABSPEC 1.012 10/22/2023 2040   LABSPEC 1.019 06/27/2011 0919   PHURINE 6.0 10/22/2023 2040   GLUCOSEU 50 (A) 10/22/2023 2040   GLUCOSEU Negative 06/27/2011 0919   HGBUR NEGATIVE 10/22/2023 2040   BILIRUBINUR NEGATIVE 10/22/2023 2040   BILIRUBINUR Negative 06/27/2011 0919   KETONESUR NEGATIVE 10/22/2023 2040   PROTEINUR >=300 (A) 10/22/2023 2040   NITRITE NEGATIVE 10/22/2023 2040   LEUKOCYTESUR NEGATIVE 10/22/2023 2040   LEUKOCYTESUR Negative 06/27/2011 0919    Radiological Exams on Admission: I have personally reviewed images DG Chest Port 1 View Result Date: 04/03/2024 EXAM: 1 VIEW(S) XRAY OF THE CHEST 04/03/2024 05:51:00 AM COMPARISON: 08/14/2023 CLINICAL HISTORY: FINDINGS: LUNGS AND PLEURA: Low lung volumes. Indistinct pulmonary vasculature with cephalization. No focal pulmonary opacity. No pleural effusion. No pneumothorax. HEART AND MEDIASTINUM: Cardiomegaly. BONES AND SOFT TISSUES: No acute osseous abnormality. IMPRESSION: 1. Cardiomegaly. 2. Low lung volumes with indistinct pulmonary vasculature and cephalization, suggestive of pulmonary venous congestion. Electronically signed by: Evalene Coho MD 04/03/2024  06:33 AM EST RP Workstation: [id number]    EKG: My personal interpretation of EKG shows: Sinus rhythm at 67 bpm, no ST elevation    Assessment/Plan Principal Problem:   CHF (congestive heart failure) (HCC) Active Problems:   Diabetes (HCC)   HLD (hyperlipidemia)   Bilateral edema of lower extremity   CVA (cerebral vascular accident) (HCC)   GERD (gastroesophageal reflux disease)   Renovascular hypertension    Assessment and Plan: 64 year old male with complicated medical history with secondary renovascular hypertension, HFpEF, last EF was more than 55% in September 2025, diabetes, HLD, history of stroke, GERD came into ED complaining of shortness of breath.  1.  Acute exacerbation of  congestive heart failure/pulmonary edema - He will be placed in observation in telemetry - Patient was given 40 mg of Lasix in the emergency room with improvement of his symptoms. - He will be given 40 mg of Lasix twice daily. - Input output charting, daily weight. - Will monitor his oxygenation.  2.  Secondary renovascular hypertension - Patient has a left renal artery stenosis with left kidney atrophy - Being planned for nephrectomy at [facility] - Continue blood pressure medication including Coreg  12.5 mg twice daily, Lasix 40 mg twice daily, nifedipine 60 mg daily, clonidine  patch weekly when he gets discharged - Continue to monitor blood pressure - Blood pressure goal should be less than 140 systolic.  3.  Type 2 diabetes - Continue insulin  sliding scale, diabetic diet - Continue monitor blood sugars  4.  Hyperlipidemia - Continue Lipitor  and Zetia  5.  History of stroke - Continue blood pressure control, statins, aspirin  and Brilinta  per neurology at [facility]  6.  GERD - Continue Protonix   7.  Slightly elevated troponin - This is related to CHF exacerbation due to demand ischemia - They are flat - There is no chest pain or EKG changes - There is no concern for acute coronary syndrome. -  Echocardiogram will be checked to evaluate for ejection fraction and wall motion abnormalities.  8.  CKD 3B - He has renovascular hypertension and left kidney atrophy - He is being planned for left nephrectomy - He is still making good urine - Continue to monitor kidney function and follow-up with outpatient at [facility]      DVT prophylaxis: SQ Heparin  Code Status: Full Code Family Communication: Patient's wife was at bedside Disposition Plan: Home Consults called: None Admission status: Observation, Telemetry bed   Nena Rebel, MD [facility] 04/03/2024, 11:44 AM       [1] No Known Allergies  "

## 2024-04-03 NOTE — ED Provider Notes (Signed)
----------------------------------------- °  8:23 AM on 04/03/2024 ----------------------------------------- Patient care assumed from Dr. Cyrena.  Patient states he is feeling slightly improved but still feeling short of breath.  Patient's workup today shows pulmonary vasculature suggestive of venous congestion.  Patient's troponin is unchanged after 2 hours.  BNP is elevated to over thousand CMP shows no significant findings besides slight worsening of the patient's creatinine.  D-dimer is positive, however this is quite nonspecific.  He denies any pleuritic pain.  Patient's creatinine would not allow a CTA and based off of chest x-ray I do not believe a VQ scan would provide any high degree of certainty either.  Patient would also have trouble lying flat for a prolonged amount of time.  Will obtain bilateral venous lower extremity ultrasounds as a precaution if the ultrasounds are negative I do not believe the patient necessarily needs any further VTE workup.  However patient remains short of breath currently satting between 90 and 92% on room air while lying in bed.  States significant shortness of breath with minimal exertion.  Patient has received IV Lasix.  Will admit for IV diuresis patient may also benefit from an echocardiogram.  Patient agreeable to plan of care.   Dorothyann Drivers, MD 04/03/24 [phone]

## 2024-04-03 NOTE — ED Provider Notes (Signed)
 "  [facility] Provider Note    Event Date/Time   First MD Initiated Contact with Patient 04/03/24 [phone]     (approximate)   History   Shortness of Breath   HPI  Richard Hunt is a 64 y.o. male   Past medical history of hypertension, diabetes, GERD, hyperlipidemia, prior CVA here with shortness of breath and chest pressure.  Was seen for similar symptoms at [facility] and discharged just yesterday.  He felt somewhat better but still symptomatic upon discharge, per his report.  But since being home the symptoms have progressively worsened.  Was initially admitted for severe hypertension to [facility] and was observed to return to a near hide normotensive blood pressures by discharge, and underwent diuresis for suspected pulmonary edema at that time as well.  He reports no respiratory infectious symptoms GI or GU symptoms.  He maintains compliance with his discharge prescription medications.  He is Spanish-speaking but is speaking fluently in English and declines Spanish interpreter.  External Medical Documents Reviewed: [facility] hospital medical records as above      Physical Exam   Triage Vital Signs: ED Triage Vitals  Encounter Vitals Group     BP 04/03/24 0523 133/71     Girls Systolic BP Percentile --      Girls Diastolic BP Percentile --      Boys Systolic BP Percentile --      Boys Diastolic BP Percentile --      Pulse Rate 04/03/24 0523 68     Resp 04/03/24 0523 18     Temp 04/03/24 0523 (!) 97.5 F (36.4 C)     Temp src --      SpO2 04/03/24 0523 95 %     Weight --      Height --      Head Circumference --      Peak Flow --      Pain Score 04/03/24 0529 5     Pain Loc --      Pain Education --      Exclude from Growth Chart --     Most recent vital signs: Vitals:   04/03/24 0523  BP: 133/71  Pulse: 68  Resp: 18  Temp: (!) 97.5 F (36.4 C)  SpO2: 95%    General: Awake, no distress.  CV:  Good peripheral  perfusion.  Resp:  Normal effort.  Abd:  No distention.  Other:  Mild bilateral ankle pitting edema.  Nontender nonperitoneal abdominal exam.  Fine rales to lung bases bilaterally without focality.  95% on room air no respiratory distress and otherwise normal vital signs normotensive.   ED Results / Procedures / Treatments   Labs (all labs ordered are listed, but only abnormal results are displayed) Labs Reviewed  PRO BRAIN NATRIURETIC PEPTIDE  COMPREHENSIVE METABOLIC PANEL WITH GFR  CBC WITH DIFFERENTIAL/PLATELET  D-DIMER, QUANTITATIVE  TROPONIN T, HIGH SENSITIVITY      EKG  ED ECG REPORT I, Ginnie Shams, the attending physician, personally viewed and interpreted this ECG.   Date: 04/03/2024  EKG Time: 0527  Rate: 67  Rhythm: sinus rhythm  Axis: nl  Intervals:nl  ST&T Change:  no STEMI    RADIOLOGY I independently reviewed and interpreted chest x-ray and see evidence of pulmonary edema I also reviewed radiologist's formal read.   PROCEDURES:  Critical Care performed: No  Procedures   MEDICATIONS ORDERED IN ED: Medications  furosemide (LASIX) injection 40 mg (has no administration in time  range)  aspirin  chewable tablet 324 mg (324 mg Oral Given 04/03/24 0620)     IMPRESSION / MDM / ASSESSMENT AND PLAN / ED COURSE  I reviewed the triage vital signs and the nursing notes.                                Patient's presentation is most consistent with acute presentation with potential threat to life or bodily function.  Differential diagnosis includes, but is not limited to, CHF exacerbation, ACS, PE, pneumothorax, respiratory infection   The patient is on the cardiac monitor to evaluate for evidence of arrhythmia and/or significant heart rate changes.  MDM: Ongoing chest pressure and shortness of breath and this patient recently seen and diuresed for suspected pulmonary edema in the setting of marked hypertension.  Progressively worsening symptoms since  being discharged now with normal blood pressures normal vital signs but fine rales, chest pressure, warranting additional workup for cardiopulmonary emergencies like ACS, PE, respiratory infection, or pulmonary edema reaccumulation.  Check basic labs, EKG, troponins, D-dimer, chest x-ray.  Patient with chest x-ray findings suggestive of pulmonary edema, ordered for 1 dose of IV Lasix.  Remainder of lab testing pending, disposition will be pending remainder of testing and response to treatment and reassessment.  Signed out to morning provider.        FINAL CLINICAL IMPRESSION(S) / ED DIAGNOSES   Final diagnoses:  SOB (shortness of breath)  Chest pressure  Acute pulmonary edema (HCC)     Rx / DC Orders   ED Discharge Orders     None        Note:  This document was prepared using Dragon voice recognition software and may include unintentional dictation errors.    Cyrena Mylar, MD 04/03/24 [phone]  "

## 2024-04-03 NOTE — ED Triage Notes (Signed)
 Pt arrives via [facility] from home with C/O shortness of breath. Pt had fluid drained from his lungs 3 days ago, just discharged from the hospital in yesterday. Pt currently 94 % on room air.  97.8 143/66 91% on room air when ems first arrived 4 liters 97%

## 2024-04-03 NOTE — ED Notes (Signed)
 Pt given water

## 2024-04-03 NOTE — ED Notes (Addendum)
 Urinal and warm blankets provided to pt and family. Pts o2 fluctuates between 92-94% with good pleth. Pt is still endorsing some SOB, but does not have labored breathing, talking in full sentences and is able to follow commands.

## 2024-04-04 ENCOUNTER — Observation Stay
Admit: 2024-04-04 | Discharge: 2024-04-04 | Disposition: A | Discharge status: Home / Self Care | Attending: Hospitalist | Admitting: Hospitalist

## 2024-04-04 DIAGNOSIS — I5033 Acute on chronic diastolic (congestive) heart failure: Secondary | ICD-10-CM | POA: Diagnosis not present

## 2024-04-04 LAB — COMPREHENSIVE METABOLIC PANEL WITH GFR
ALT: 15 U/L (ref 0–44)
AST: 15 U/L (ref 15–41)
Albumin: 3.7 g/dL (ref 3.5–5.0)
Alkaline Phosphatase: 111 U/L (ref 38–126)
Anion gap: 14 (ref 5–15)
BUN: 38 mg/dL — ABNORMAL HIGH (ref 8–23)
CO2: 23 mmol/L (ref 22–32)
Calcium: 8.6 mg/dL — ABNORMAL LOW (ref 8.9–10.3)
Chloride: 99 mmol/L (ref 98–111)
Creatinine, Ser: 3.14 mg/dL — ABNORMAL HIGH (ref 0.61–1.24)
GFR, Estimated: 21 mL/min — ABNORMAL LOW
Glucose, Bld: 143 mg/dL — ABNORMAL HIGH (ref 70–99)
Potassium: 3.4 mmol/L — ABNORMAL LOW (ref 3.5–5.1)
Sodium: 136 mmol/L (ref 135–145)
Total Bilirubin: 0.5 mg/dL (ref 0.0–1.2)
Total Protein: 6.4 g/dL — ABNORMAL LOW (ref 6.5–8.1)

## 2024-04-04 LAB — ECHOCARDIOGRAM COMPLETE
AR max vel: 3.55 cm2
AV Area VTI: 3.7 cm2
AV Area mean vel: 3.36 cm2
AV Mean grad: 7 mmHg
AV Peak grad: 12.3 mmHg
Ao pk vel: 1.75 m/s
Area-P 1/2: 4.74 cm2
Height: 65 in
MV M vel: 5.6 m/s
MV Peak grad: 125.4 mmHg
MV VTI: 3.37 cm2
Radius: 0.7 cm
S' Lateral: 3.4 cm
Weight: 3220.48 [oz_av]

## 2024-04-04 LAB — CBC
HCT: 34.6 % — ABNORMAL LOW (ref 39.0–52.0)
Hemoglobin: 12.4 g/dL — ABNORMAL LOW (ref 13.0–17.0)
MCH: 31.1 pg (ref 26.0–34.0)
MCHC: 35.8 g/dL (ref 30.0–36.0)
MCV: 86.7 fL (ref 80.0–100.0)
Platelets: 246 K/uL (ref 150–400)
RBC: 3.99 MIL/uL — ABNORMAL LOW (ref 4.22–5.81)
RDW: 12.3 % (ref 11.5–15.5)
WBC: 8.7 K/uL (ref 4.0–10.5)
nRBC: 0 % (ref 0.0–0.2)

## 2024-04-04 LAB — GLUCOSE, CAPILLARY
Glucose-Capillary: 144 mg/dL — ABNORMAL HIGH (ref 70–99)
Glucose-Capillary: 185 mg/dL — ABNORMAL HIGH (ref 70–99)
Glucose-Capillary: 216 mg/dL — ABNORMAL HIGH (ref 70–99)
Glucose-Capillary: 202 mg/dL — ABNORMAL HIGH (ref 70–99)

## 2024-04-04 LAB — PROTIME-INR
INR: 1 (ref 0.8–1.2)
Prothrombin Time: 13.6 s (ref 11.4–15.2)

## 2024-04-04 MED ORDER — FUROSEMIDE 40 MG PO TABS
40.0000 mg | ORAL_TABLET | Freq: Every day | ORAL | Status: DC
  Administered 2024-04-04 – 2024-04-07 (×4): 40 mg via ORAL
  Filled 2024-04-04 (×4): qty 1

## 2024-04-04 NOTE — Progress Notes (Signed)
 " Progress Note   Patient: Richard Hunt FMW:[id number] DOB: 10/06/1960 DOA: 04/03/2024     0 DOS: the patient was seen and examined on 04/04/2024   Brief hospital course: The patient is a 64 yr old man who presented to [facility] ED on 04/03/2024 with complaints of shortness of breath of one day's duration. The patient had recently been admitted to and discharged from [facility] on 04/02/2024 for the same complaint. Upon discharged the patient was improved, but the shortness of breath returned after 6 hours. He states that he was compliant with discharge medications.   The patient carries a past medical history significant for RAS with secondary hypertension and a left sided atrophic kidney, DM II, CKD 3B, history of CVA, and pulmonary edema.   In the ED the patient was found to be in pulmonary edema.  He was admitted to a telemetry bed. He is being diuresed. Echocardiogram has demonstrated EF of 55-60% with normal LV function. He has LVH. There is evidence for Grade II diastolic dysfunction. RV is normal. There is no significant valvular abnormality.  On 04/04/2024 the patient has been ambulated in the hall on room air. However, in the middle of the walk the patient became SOB and had chest tightness. He also complains that he continues to have chest tightness and shortness of breath when he tried to lie flat.  Will continue diuresis.   Assessment and Plan: 64 year old male with complicated medical history with secondary renovascular hypertension, HFpEF, last EF was more than 55% in September 2025, diabetes, HLD, history of stroke, GERD came into ED complaining of shortness of breath.   1.  Acute exacerbation of congestive heart failure/pulmonary edema - The patient is receiving lasix  40 mg IV BID.  - He is currently 1160 cc of fluid negative. - Will monitor his oxygenation. - Consider stress testing for DOE   2.  Secondary renovascular hypertension - Patient has a left renal artery  stenosis with left kidney atrophy - Being planned for nephrectomy at [facility] - Continue blood pressure medication including Coreg  12.5 mg twice daily, Lasix  40 mg twice daily, nifedipine  60 mg daily, clonidine  patch weekly when he gets discharged - Continue to monitor blood pressure - Blood pressure goal should be less than 140 systolic.   3.  Type 2 diabetes - Continue insulin  sliding scale, diabetic diet - Continue monitor blood sugars   4.  Hyperlipidemia - Continue Lipitor  and Zetia    5.  History of stroke - Continue blood pressure control, statins, aspirin  and Brilinta  per neurology at [facility]   6.  GERD - Continue Protonix    7.  Slightly elevated troponin - This is related to CHF exacerbation due to demand ischemia - They are flat - There is no chest pain or EKG changes - There is no concern for acute coronary syndrome. - Echocardiogram will be checked to evaluate for ejection fraction and wall motion abnormalities.   8.  CKD 3B - He has renovascular hypertension and left kidney atrophy - He is being planned for left nephrectomy - He is still making good urine - Continue to monitor kidney function and follow-up with outpatient at [facility]   DVT prophylaxis: SQ Heparin  Code Status: Full Code Family Communication: Patient's wife was at bedside Disposition Plan: Home Consults called: None Admission status: Observation, Telemetry bed       Subjective: The patient is sitting up at bedside with family. Complaining of chest pain and SOB with lying flat.  Physical Exam:  Vitals:   04/04/24 0500 04/04/24 0753 04/04/24 1157 04/04/24 1631  BP: (!) 149/78 (!) 151/76 (!) 163/89 (!) 141/68  Pulse: 70 70 72 73  Resp: (!) 23 16 16 16   Temp: 98.1 F (36.7 C) 97.8 F (36.6 C)  98.1 F (36.7 C)  TempSrc: Oral     SpO2: 100% 99% 100% 100%  Weight: 91.3 kg     Height:       Exam:  Constitutional:  The patient is awake, alert, and oriented x 3. No acute distress. Respiratory:  No  increased work of breathing. No wheezes, rales, or rhonchi No tactile fremitus Cardiovascular:  Regular rate and rhythm No murmurs, ectopy, or gallups. No lateral PMI. No thrills. Abdomen:  Abdomen is soft, non-tender, non-distended No hernias, masses, or organomegaly Normoactive bowel sounds.  Musculoskeletal:  No cyanosis, clubbing, or edema Skin:  No rashes, lesions, ulcers palpation of skin: no induration or nodules Neurologic:  CN 2-12 intact Sensation all 4 extremities intact Psychiatric:  Mental status Mood, affect appropriate Orientation to person, place, time  judgment and insight appear intact  Data Reviewed:  Echocardiogram, CBC, BMP  Family Communication: Family at bedside.  Disposition: Status is: Observation The patient remains OBS appropriate and will d/c before 2 midnights.  Planned Discharge Destination: Home    Time spent: 44 minutes  Author: Katie Faraone, DO 04/04/2024 7:07 PM  For on call review [email or web address].  "

## 2024-04-04 NOTE — Care Management Obs Status (Signed)
 MEDICARE OBSERVATION STATUS NOTIFICATION   Patient Details  Name: Richard Hunt MRN: [id number] Date of Birth: Mar 14, 1961   Medicare Observation Status Notification Given:  Yes    [name] 04/04/2024, 11:56 AM

## 2024-04-04 NOTE — Plan of Care (Signed)
  Problem: Education: Goal: Knowledge of General Education information will improve Description: Including pain rating scale, medication(s)/side effects and non-pharmacologic comfort measures Outcome: Progressing   Problem: Health Behavior/Discharge Planning: Goal: Ability to manage health-related needs will improve Outcome: Progressing   Problem: Clinical Measurements: Goal: Ability to maintain clinical measurements within normal limits will improve Outcome: Progressing   Problem: Activity: Goal: Risk for activity intolerance will decrease Outcome: Progressing   Problem: Nutrition: Goal: Adequate nutrition will be maintained Outcome: Progressing   Problem: Pain Managment: Goal: General experience of comfort will improve and/or be controlled Outcome: Progressing   Problem: Safety: Goal: Ability to remain free from injury will improve Outcome: Progressing   Problem: Skin Integrity: Goal: Risk for impaired skin integrity will decrease Outcome: Progressing

## 2024-04-04 NOTE — Progress Notes (Signed)
*  PRELIMINARY RESULTS* Echocardiogram 2D Echocardiogram has been performed.  Floydene Harder 04/04/2024, 8:51 AM

## 2024-04-04 NOTE — TOC CM/SW Note (Signed)
 Transition of Care [facility]) - Inpatient Brief Assessment   Patient Details  Name: Richard Hunt MRN: [id number] Date of Birth: 07-25-1960  Transition of Care [facility]) CM/SW Contact:    Shasta DELENA Daring, RN Phone Number: 04/04/2024, 9:43 AM   Clinical Narrative:  Transition of Care Department [facility]) has reviewed patient and no TOC needs have been identified at this time.  If new patient transition needs arise, please place a TOC consult.   Transition of Care Asessment: Insurance and Status: Insurance coverage has been reviewed Patient has primary care physician: Yes Home environment has been reviewed: Home Prior level of function:: independent   Social Drivers of Health Review: SDOH reviewed no interventions necessary Readmission risk has been reviewed: No Transition of care needs: no transition of care needs at this time

## 2024-04-05 DIAGNOSIS — I150 Renovascular hypertension: Secondary | ICD-10-CM | POA: Diagnosis present

## 2024-04-05 DIAGNOSIS — E1122 Type 2 diabetes mellitus with diabetic chronic kidney disease: Secondary | ICD-10-CM | POA: Diagnosis present

## 2024-04-05 DIAGNOSIS — E7800 Pure hypercholesterolemia, unspecified: Secondary | ICD-10-CM | POA: Diagnosis present

## 2024-04-05 DIAGNOSIS — I110 Hypertensive heart disease with heart failure: Secondary | ICD-10-CM | POA: Diagnosis present

## 2024-04-05 DIAGNOSIS — R600 Localized edema: Secondary | ICD-10-CM | POA: Diagnosis not present

## 2024-04-05 DIAGNOSIS — I2510 Atherosclerotic heart disease of native coronary artery without angina pectoris: Secondary | ICD-10-CM | POA: Diagnosis present

## 2024-04-05 DIAGNOSIS — I5033 Acute on chronic diastolic (congestive) heart failure: Secondary | ICD-10-CM | POA: Diagnosis present

## 2024-04-05 DIAGNOSIS — I2489 Other forms of acute ischemic heart disease: Secondary | ICD-10-CM | POA: Diagnosis present

## 2024-04-05 DIAGNOSIS — Z7982 Long term (current) use of aspirin: Secondary | ICD-10-CM | POA: Diagnosis not present

## 2024-04-05 DIAGNOSIS — N184 Chronic kidney disease, stage 4 (severe): Secondary | ICD-10-CM | POA: Diagnosis present

## 2024-04-05 DIAGNOSIS — N261 Atrophy of kidney (terminal): Secondary | ICD-10-CM | POA: Diagnosis present

## 2024-04-05 DIAGNOSIS — Z8673 Personal history of transient ischemic attack (TIA), and cerebral infarction without residual deficits: Secondary | ICD-10-CM | POA: Diagnosis not present

## 2024-04-05 DIAGNOSIS — Z833 Family history of diabetes mellitus: Secondary | ICD-10-CM | POA: Diagnosis not present

## 2024-04-05 DIAGNOSIS — Z87891 Personal history of nicotine dependence: Secondary | ICD-10-CM | POA: Diagnosis not present

## 2024-04-05 DIAGNOSIS — Z7902 Long term (current) use of antithrombotics/antiplatelets: Secondary | ICD-10-CM | POA: Diagnosis not present

## 2024-04-05 DIAGNOSIS — I509 Heart failure, unspecified: Secondary | ICD-10-CM | POA: Diagnosis present

## 2024-04-05 DIAGNOSIS — Z794 Long term (current) use of insulin: Secondary | ICD-10-CM | POA: Diagnosis not present

## 2024-04-05 DIAGNOSIS — E785 Hyperlipidemia, unspecified: Secondary | ICD-10-CM | POA: Diagnosis not present

## 2024-04-05 DIAGNOSIS — J810 Acute pulmonary edema: Secondary | ICD-10-CM | POA: Diagnosis not present

## 2024-04-05 DIAGNOSIS — R0609 Other forms of dyspnea: Secondary | ICD-10-CM | POA: Diagnosis not present

## 2024-04-05 DIAGNOSIS — Z79899 Other long term (current) drug therapy: Secondary | ICD-10-CM | POA: Diagnosis not present

## 2024-04-05 DIAGNOSIS — Z7984 Long term (current) use of oral hypoglycemic drugs: Secondary | ICD-10-CM | POA: Diagnosis not present

## 2024-04-05 DIAGNOSIS — R0789 Other chest pain: Secondary | ICD-10-CM | POA: Diagnosis not present

## 2024-04-05 DIAGNOSIS — Q613 Polycystic kidney, unspecified: Secondary | ICD-10-CM | POA: Diagnosis not present

## 2024-04-05 DIAGNOSIS — Z9049 Acquired absence of other specified parts of digestive tract: Secondary | ICD-10-CM | POA: Diagnosis not present

## 2024-04-05 DIAGNOSIS — K219 Gastro-esophageal reflux disease without esophagitis: Secondary | ICD-10-CM | POA: Diagnosis present

## 2024-04-05 DIAGNOSIS — R0602 Shortness of breath: Secondary | ICD-10-CM | POA: Diagnosis not present

## 2024-04-05 DIAGNOSIS — R7989 Other specified abnormal findings of blood chemistry: Secondary | ICD-10-CM | POA: Diagnosis not present

## 2024-04-05 DIAGNOSIS — I701 Atherosclerosis of renal artery: Secondary | ICD-10-CM | POA: Diagnosis present

## 2024-04-05 LAB — HEMOGLOBIN A1C
Hgb A1c MFr Bld: 5.7 % — ABNORMAL HIGH (ref 4.8–5.6)
Mean Plasma Glucose: 116.89 mg/dL

## 2024-04-05 LAB — CBC
HCT: 35.3 % — ABNORMAL LOW (ref 39.0–52.0)
Hemoglobin: 12.5 g/dL — ABNORMAL LOW (ref 13.0–17.0)
MCH: 30.6 pg (ref 26.0–34.0)
MCHC: 35.4 g/dL (ref 30.0–36.0)
MCV: 86.3 fL (ref 80.0–100.0)
Platelets: 262 K/uL (ref 150–400)
RBC: 4.09 MIL/uL — ABNORMAL LOW (ref 4.22–5.81)
RDW: 12.2 % (ref 11.5–15.5)
WBC: 11.3 K/uL — ABNORMAL HIGH (ref 4.0–10.5)
nRBC: 0 % (ref 0.0–0.2)

## 2024-04-05 LAB — BASIC METABOLIC PANEL WITH GFR
Anion gap: 13 (ref 5–15)
BUN: 39 mg/dL — ABNORMAL HIGH (ref 8–23)
CO2: 24 mmol/L (ref 22–32)
Calcium: 8.8 mg/dL — ABNORMAL LOW (ref 8.9–10.3)
Chloride: 100 mmol/L (ref 98–111)
Creatinine, Ser: 3.15 mg/dL — ABNORMAL HIGH (ref 0.61–1.24)
GFR, Estimated: 21 mL/min — ABNORMAL LOW
Glucose, Bld: 153 mg/dL — ABNORMAL HIGH (ref 70–99)
Potassium: 3.5 mmol/L (ref 3.5–5.1)
Sodium: 137 mmol/L (ref 135–145)

## 2024-04-05 LAB — GLUCOSE, CAPILLARY
Glucose-Capillary: 154 mg/dL — ABNORMAL HIGH (ref 70–99)
Glucose-Capillary: 222 mg/dL — ABNORMAL HIGH (ref 70–99)
Glucose-Capillary: 194 mg/dL — ABNORMAL HIGH (ref 70–99)
Glucose-Capillary: 208 mg/dL — ABNORMAL HIGH (ref 70–99)

## 2024-04-05 MED ORDER — LACTULOSE 10 GM/15ML PO SOLN
20.0000 g | Freq: Two times a day (BID) | ORAL | Status: DC
  Administered 2024-04-05 – 2024-04-07 (×4): 20 g via ORAL
  Filled 2024-04-05 (×4): qty 30

## 2024-04-05 MED ORDER — INSULIN ASPART 100 UNIT/ML IJ SOLN
0.0000 [IU] | Freq: Three times a day (TID) | INTRAMUSCULAR | Status: DC
  Administered 2024-04-05: 2 [IU] via SUBCUTANEOUS
  Administered 2024-04-05 – 2024-04-06 (×3): 1 [IU] via SUBCUTANEOUS

## 2024-04-05 MED ORDER — ISOSORBIDE MONONITRATE ER 60 MG PO TB24
60.0000 mg | ORAL_TABLET | Freq: Every day | ORAL | Status: DC
  Administered 2024-04-05 – 2024-04-07 (×3): 60 mg via ORAL
  Filled 2024-04-05 (×3): qty 1

## 2024-04-05 MED ORDER — BISACODYL 10 MG RE SUPP: 10.0000 mg | Freq: Once | RECTAL | Status: DC

## 2024-04-05 NOTE — Progress Notes (Signed)
 " Progress Note   Patient: Richard Hunt FMW:[id number] DOB: 1961-02-27 DOA: 04/03/2024     0 DOS: the patient was seen and examined on 04/05/2024   Brief hospital course: The patient is a 64 yr old man who presented to [facility] ED on 04/03/2024 with complaints of shortness of breath of one day's duration. The patient had recently been admitted to and discharged from [facility] on 04/02/2024 for the same complaint. Upon discharged the patient was improved, but the shortness of breath returned after 6 hours. He states that he was compliant with discharge medications.   The patient carries a past medical history significant for RAS with secondary hypertension and a left sided atrophic kidney, DM II, CKD 3B, history of CVA, and pulmonary edema.   In the ED the patient was found to be in pulmonary edema.  He was admitted to a telemetry bed. He is being diuresed. Echocardiogram has demonstrated EF of 55-60% with normal LV function. He has LVH. There is evidence for Grade II diastolic dysfunction. RV is normal. There is no significant valvular abnormality.  On 04/04/2024 the patient has been ambulated in the hall on room air. However, in the middle of the walk the patient became SOB and had chest tightness. He also complains that he continues to have chest tightness and shortness of breath when he tried to lie flat. Continue diuresis.   Cardiology consulted for dyspnea/chest pain with exertion. The patient will undergo nuclear medicine stress on Monday.  Assessment and Plan: 64 year old male with complicated medical history with secondary renovascular hypertension, HFpEF, last EF was more than 55% in September 2025, diabetes, HLD, history of stroke, GERD came into ED complaining of shortness of breath.   1.  Acute exacerbation of congestive heart failure/pulmonary edema - The patient is receiving lasix  40 mg IV BID.  - He is currently 1160 cc of fluid negative. - Will monitor his oxygenation. -  Consider stress testing for DOE  2. Exertional dyspnea and chest pain. - Cardiology has been consulted. -The patient has been started on Imdur . -Plan is for nuclear medicine stress test on Monday. - Cardiology's assistance is greatly appreciated.   2.  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   3.  Type 2 diabetes - Continue insulin  sliding scale, diabetic diet - Continue monitor blood sugars   4.  Hyperlipidemia - Continue Lipitor  and Zetia    5.  History of stroke - Continue blood pressure control, statins, aspirin  and Brilinta  per neurology at [facility]   6.  GERD - Continue Protonix    7.  Slightly elevated troponin - This is related to CHF exacerbation due to demand ischemia - They are flat - There is no chest pain or EKG changes - There is no concern for acute coronary syndrome. - Echocardiogram will be checked to evaluate for ejection fraction and wall motion abnormalities.   8.  CKD 3B - He has renovascular hypertension and left kidney atrophy - He is being planned for left nephrectomy - He is still making good urine - Continue to monitor kidney function and follow-up with outpatient at [facility]   DVT prophylaxis: SQ Heparin  Code  Status: Full Code Family Communication: Patient's wife was at bedside Disposition Plan: Home Consults called: None Admission status: Observation, Telemetry bed       Subjective: The patient is sitting up at bedside with family. Complaining of chest pain and SOB with lying flat.  Physical Exam: Vitals:   04/05/24 0500 04/05/24 0826 04/05/24 1306 04/05/24 1654  BP:  (!) 157/73 (!)  172/79 (!) 146/70  Pulse:  74 77 76  Resp:  18 18 18   Temp:  98 F (36.7 C) 98 F (36.7 C) (!) 97.5 F (36.4 C)  TempSrc:  Oral    SpO2:  100% 100% 97%  Weight: 90.2 kg     Height:       Exam:  Constitutional:  The patient is awake, alert, and oriented x 3. No acute distress. Respiratory:  No increased work of breathing. No wheezes, rales, or rhonchi No tactile fremitus Cardiovascular:  Regular rate and rhythm No murmurs, ectopy, or gallups. No lateral PMI. No thrills. Abdomen:  Abdomen is soft, non-tender, non-distended No hernias, masses, or organomegaly Normoactive bowel sounds.  Musculoskeletal:  No cyanosis, clubbing, or edema Skin:  No rashes, lesions, ulcers palpation of skin: no induration or nodules Neurologic:  CN 2-12 intact Sensation all 4 extremities intact Psychiatric:  Mental status Mood, affect appropriate Orientation to person, place, time  judgment and insight appear intact  Data Reviewed:  Echocardiogram, CBC, BMP  Family Communication: Family at bedside.  Disposition: Status is: Observation The patient remains OBS appropriate and will d/c before 2 midnights.  Planned Discharge Destination: Home    Time spent: 40 minutes  Author: Gracin Mcpartland, DO 04/05/2024 5:22 PM  For on call review [email or web address].  "

## 2024-04-05 NOTE — Consult Note (Signed)
 [facility] Cardiology  CARDIOLOGY CONSULT NOTE  Patient ID: Richard Hunt MRN: [id number] DOB/AGE: 11/27/1960 64 y.o.  Admit date: 04/03/2024 Referring Physician chest pain, shortness of breath Reason for Consultation shortness of breath, chest discomfort  HPI: 64 year old male on whom our service is consulted for shortness of breath, chest discomfort.  Past medical history significant for coronary artery disease as noted by three-vessel coronary artery calcification CT chest, resistant hypertension, heart failure with preserved EF, type 2 diabetes, hyperlipidemia, history of stroke, left renal artery stenosis with atrophic kidney, CKD stage IV.  Patient was recently at outside hospital with shortness of breath and elevated blood pressure also had bradycardia.  Treated with diuresis and medications were adjusted.  There was also concern for noncompliance with meds at home.  He again presented with shortness of breath with exertion and chest tightness.  Chest x-ray showed pulmonary vascular congestion.  BNP elevated.  Troponin minimal and flat.  EKG showed sinus rhythm without any ischemic changes.  Echocardiogram with normal biventricular systolic function, no wall motion abnormality, no significant valvular abnormality, mild to moderate MR.  Moderate LVH  Review of systems complete and found to be negative unless listed above     Past Medical History:  Diagnosis Date   Acute cholecystitis 12/11/2016   Acute kidney injury (nontraumatic)    Bilateral edema of lower extremity 01/02/2014   Chronic venous insufficiency 01/16/2014   Diabetes (HCC) 12/11/2016   Diabetes mellitus without complication (HCC)    GERD (gastroesophageal reflux disease)    HLD (hyperlipidemia)    HTN (hypertension) 12/11/2016   Hypercholesterolemia    Hypertension    Osteoarthritis    Polycystic kidney disease    Ulcer of calf (HCC) 08/29/2013    Past Surgical History:  Procedure Laterality Date    CHOLECYSTECTOMY N/A 12/11/2016   Procedure: LAPAROSCOPIC CHOLECYSTECTOMY;  Surgeon: Shelva Dunnings, MD;  Location: [facility] ORS;  Service: General;  Laterality: N/A;   VEIN SURGERY      Medications Prior to Admission  Medication Sig Dispense Refill Last Dose/Taking   artificial tears ophthalmic solution Place 1 drop into both eyes as needed for dry eyes. 15 mL 0 Unknown   aspirin  81 MG chewable tablet Chew 1 tablet (81 mg total) by mouth daily. (Patient taking differently: Chew 81 mg by mouth at bedtime.) 30 tablet 2 04/02/2024   atorvastatin  (LIPITOR ) 80 MG tablet Take 1 tablet (80 mg total) by mouth at bedtime. 30 tablet 2 04/02/2024   carvedilol  (COREG ) 12.5 MG tablet Take 12.5 mg by mouth 2 (two) times daily.   04/02/2024   cloNIDine  (CATAPRES  - DOSED IN MG/24 HR) 0.1 mg/24hr patch Place 1 patch onto the skin once a week.   Unknown   Docusate Sodium  (DSS) 100 MG CAPS Take 100 mg by mouth 2 (two) times daily as needed.   Unknown   ezetimibe  (ZETIA ) 10 MG tablet Take 10 mg by mouth at bedtime.   04/02/2024   glimepiride  (AMARYL ) 1 MG tablet Take 1 mg by mouth daily with breakfast.   04/02/2024   KERENDIA 10 MG TABS Take 1 tablet by mouth daily.   04/02/2024   NIFEdipine  (PROCARDIA  XL/NIFEDICAL XL) 60 MG 24 hr tablet Take 60 mg by mouth daily.   Taking   omeprazole (PRILOSEC) 40 MG capsule Take 40 mg by mouth daily.   04/02/2024   pregabalin  (LYRICA ) 25 MG capsule Take 25 mg by mouth 2 (two) times daily.   04/02/2024   ticagrelor  (BRILINTA ) 90 MG TABS  tablet Take 1 tablet (90 mg total) by mouth 2 (two) times daily. 60 tablet 11 04/02/2024   acetaminophen  (TYLENOL ) 325 MG tablet Take 2 tablets (650 mg total) by mouth every 4 (four) hours as needed for mild pain (pain score 1-3) or fever (or temp > 37.5 C (99.5 F)). (Patient not taking: Reported on 12/26/2023)   Not Taking   cloNIDine  (CATAPRES ) 0.1 MG tablet Take 1 tablet (0.1 mg total) by mouth daily as needed (bP >180 systolic). (Patient not  taking: Reported on 04/03/2024) 60 tablet 11 Not Taking   gabapentin  (NEURONTIN ) 100 MG capsule Take 100 mg by mouth at bedtime. (Patient not taking: Reported on 04/03/2024)   Not Taking   hydrALAZINE  (APRESOLINE ) 100 MG tablet Take 1 tablet (100 mg total) by mouth 3 (three) times daily. (Patient not taking: Reported on 04/03/2024) 90 tablet 2 Not Taking   hydrochlorothiazide  (HYDRODIURIL ) 25 MG tablet Take 25 mg by mouth daily in the afternoon. (Patient not taking: Reported on 04/03/2024)   Not Taking   losartan  (COZAAR ) 100 MG tablet Take 100 mg by mouth at bedtime. (Patient not taking: Reported on 04/03/2024)   Not Taking   valACYclovir  (VALTREX ) 1000 MG tablet Take 1 tablet (1,000 mg total) by mouth every 12 (twelve) hours. (Patient not taking: Reported on 04/03/2024) 11 tablet 0 Not Taking   verapamil  (CALAN ) 80 MG tablet Take 1 tablet (80 mg total) by mouth every 8 (eight) hours. (Patient not taking: Reported on 04/03/2024) 90 tablet 2 Not Taking   Social History   Socioeconomic History   Marital status: Married    Spouse name: Not on file   Number of children: Not on file   Years of education: Not on file   Highest education level: Not on file  Occupational History   Not on file  Tobacco Use   Smoking status: Never   Smokeless tobacco: Never  Vaping Use   Vaping status: Never Used  Substance and Sexual Activity   Alcohol  use: No   Drug use: No   Sexual activity: Not on file  Other Topics Concern   Not on file  Social History Narrative   Not on file   Social Drivers of Health   Tobacco Use: Low Risk (04/03/2024)   Patient History    Smoking Tobacco Use: Never    Smokeless Tobacco Use: Never    Passive Exposure: Not on file  Recent Concern: Tobacco Use - Medium Risk (04/01/2024)   Received from [facility]   Patient History    Smoking Tobacco Use: Former    Smokeless Tobacco Use: Never    Passive Exposure: Not on file  Financial Resource Strain: Low Risk  (01/23/2024)    Received from [facility]   Overall Financial Resource Strain (CARDIA)    Difficulty of Paying Living Expenses: Not very hard  Food Insecurity: No Food Insecurity (04/04/2024)   Epic    Worried About Running Out of Food in the Last Year: Never true    Ran Out of Food in the Last Year: Never true  Transportation Needs: No Transportation Needs (04/04/2024)   Epic    Lack of Transportation (Medical): No    Lack of Transportation (Non-Medical): No  Physical Activity: Not on file  Stress: Not on file  Social Connections: Moderately Integrated (04/04/2024)   Social Connection and Isolation Panel    Frequency of Communication with Friends and Family: More than three times a week    Frequency of  Social Gatherings with Friends and Family: More than three times a week    Attends Religious Services: 1 to 4 times per year    Active Member of [employer] or Organizations: No    Attends Banker Meetings: Never    Marital Status: Married  Catering Manager Violence: Not At Risk (04/04/2024)   Epic    Fear of Current or Ex-Partner: No    Emotionally Abused: No    Physically Abused: No    Sexually Abused: No  Depression (PHQ2-9): Not on file  Alcohol  Screen: Not on file  Housing: Low Risk (04/04/2024)   Epic    Unable to Pay for Housing in the Last Year: No    Number of Times Moved in the Last Year: 0    Homeless in the Last Year: No  Utilities: Not At Risk (04/04/2024)   Epic    Threatened with loss of utilities: No  Health Literacy: Not on file    Family History  Problem Relation Age of Onset   Diabetes Mother       Review of systems complete and found to be negative unless listed above      PHYSICAL EXAM  Alert and oriented x 3.   No significant murmur No wheeze or crackles No pedal edema.  Labs:   Lab Results  Component Value Date   WBC 11.3 (H) 04/05/2024   HGB 12.5 (L) 04/05/2024   HCT 35.3 (L) 04/05/2024   MCV 86.3 04/05/2024   PLT 262 04/05/2024     Recent Labs  Lab 04/04/24 0324 04/05/24 0447  NA 136 137  K 3.4* 3.5  CL 99 100  CO2 23 24  BUN 38* 39*  CREATININE 3.14* 3.15*  CALCIUM  8.6* 8.8*  PROT 6.4*  --   BILITOT 0.5  --   ALKPHOS 111  --   ALT 15  --   AST 15  --   GLUCOSE 143* 153*   Lab Results  Component Value Date   TROPONINI <0.03 12/11/2016    Lab Results  Component Value Date   CHOL 96 12/27/2023   CHOL 138 10/21/2023   CHOL 156 12/11/2016   Lab Results  Component Value Date   HDL 30 (L) 12/27/2023   HDL 41 10/21/2023   HDL 46 12/11/2016   Lab Results  Component Value Date   LDLCALC 50 12/27/2023   LDLCALC 74 10/21/2023   LDLCALC 85 12/11/2016   Lab Results  Component Value Date   TRIG 79 12/27/2023   TRIG 113 10/21/2023   TRIG 127 12/11/2016   Lab Results  Component Value Date   CHOLHDL 3.2 12/27/2023   CHOLHDL 3.4 10/21/2023   CHOLHDL 3.4 12/11/2016   No results found for: LDLDIRECT    Radiology: ECHOCARDIOGRAM COMPLETE Result Date: 04/04/2024    ECHOCARDIOGRAM REPORT   Patient Name:   YAZEN ROSKO Date of Exam: 04/04/2024 Medical Rec #:  [id number]               Height:       65.0 in Accession #:    [id number]              Weight:       201.3 lb Date of Birth:  1961-03-22               BSA:          1.984 m Patient Age:    60 years  BP:           149/78 mmHg Patient Gender: M                       HR:           70 bpm. Exam Location:  [facility] Procedure: 2D Echo, 3D Echo, Cardiac Doppler, Color Doppler and Strain Analysis            (Both Spectral and Color Flow Doppler were utilized during            procedure). Indications:     CHF-acute diastolic I50.31  History:         Patient has prior history of Echocardiogram examinations, most                  recent 12/27/2023. Risk Factors:Diabetes, Hypertension and                  Dyslipidemia.  Sonographer:     Christopher Furnace Referring Phys:  [id number] [facility] PAUDEL Diagnosing Phys: Keller Paterson  Sonographer Comments: Global  longitudinal strain was attempted. IMPRESSIONS  1. Left ventricular ejection fraction, by estimation, is 55 to 60%. The left ventricle has normal function. The left ventricle has no regional wall motion abnormalities. There is moderate concentric left ventricular hypertrophy. Left ventricular diastolic parameters are consistent with Grade II diastolic dysfunction (pseudonormalization).  2. Right ventricular systolic function is normal. The right ventricular size is normal.  3. Left atrial size was mildly dilated.  4. The mitral valve is normal in structure. Mild to moderate mitral valve regurgitation.  5. The aortic valve is tricuspid. Aortic valve regurgitation is not visualized. FINDINGS  Left Ventricle: Left ventricular ejection fraction, by estimation, is 55 to 60%. The left ventricle has normal function. The left ventricle has no regional wall motion abnormalities. The left ventricular internal cavity size was normal in size. There is  moderate concentric left ventricular hypertrophy. Left ventricular diastolic parameters are consistent with Grade II diastolic dysfunction (pseudonormalization). Right Ventricle: The right ventricular size is normal. No increase in right ventricular wall thickness. Right ventricular systolic function is normal. Left Atrium: Left atrial size was mildly dilated. Right Atrium: Right atrial size was normal in size. Pericardium: There is no evidence of pericardial effusion. Mitral Valve: The mitral valve is normal in structure. Mild to moderate mitral valve regurgitation. MV peak gradient, 9.1 mmHg. The mean mitral valve gradient is 3.0 mmHg. Tricuspid Valve: The tricuspid valve is normal in structure. Tricuspid valve regurgitation is trivial. Aortic Valve: The aortic valve is tricuspid. Aortic valve regurgitation is not visualized. Aortic valve mean gradient measures 7.0 mmHg. Aortic valve peak gradient measures 12.2 mmHg. Aortic valve area, by VTI measures 3.70 cm. Pulmonic Valve:  The pulmonic valve was not well visualized. Pulmonic valve regurgitation is not visualized. Aorta: The aortic root and ascending aorta are structurally normal, with no evidence of dilitation. IAS/Shunts: The atrial septum is grossly normal.  LEFT VENTRICLE PLAX 2D LVIDd:         5.20 cm   Diastology LVIDs:         3.40 cm   LV e' medial:    6.53 cm/s LV PW:         1.60 cm   LV E/e' medial:  18.5 LV IVS:        1.50 cm   LV e' lateral:   7.07 cm/s LVOT diam:     2.95 cm   LV  E/e' lateral: 17.1 LV SV:         129 LV SV Index:   65        2D Longitudinal Strain LVOT Area:     6.83 cm  2D Strain GLS Avg:     16.6 % LV IVRT:       85 msec  RIGHT VENTRICLE RV Basal diam:  3.30 cm  PULMONARY VEINS RV Mid diam:    3.20 cm  Diastolic Velocity: 24.20 cm/s                          S/D Velocity:       2.90                          Systolic Velocity:  69.40 cm/s LEFT ATRIUM              Index        RIGHT ATRIUM           Index LA diam:        4.80 cm  2.42 cm/m   RA Area:     15.20 cm LA Vol (A2C):   53.3 ml  26.87 ml/m  RA Volume:   35.80 ml  18.05 ml/m LA Vol (A4C):   100.0 ml 50.42 ml/m LA Biplane Vol: 79.4 ml  40.03 ml/m  AORTIC VALVE AV Area (Vmax):    3.55 cm AV Area (Vmean):   3.36 cm AV Area (VTI):     3.70 cm AV Vmax:           175.00 cm/s AV Vmean:          122.000 cm/s AV VTI:            0.349 m AV Peak Grad:      12.2 mmHg AV Mean Grad:      7.0 mmHg LVOT Vmax:         91.00 cm/s LVOT Vmean:        60.000 cm/s LVOT VTI:          0.189 m LVOT/AV VTI ratio: 0.54  AORTA Ao Root diam: 3.60 cm MITRAL VALVE                  TRICUSPID VALVE MV Area (PHT): 4.74 cm       TR Peak grad:   13.0 mmHg MV Area VTI:   3.37 cm       TR Vmax:        180.00 cm/s MV Peak grad:  9.1 mmHg MV Mean grad:  3.0 mmHg       SHUNTS MV Vmax:       1.51 m/s       Systemic VTI:  0.19 m MV Vmean:      83.7 cm/s      Systemic Diam: 2.95 cm MV Decel Time: 160 msec MR Peak grad:    125.4 mmHg MR Mean grad:    84.0 mmHg MR Vmax:          560.00 cm/s MR Vmean:        431.0 cm/s MR PISA:         3.08 cm MR PISA Eff ROA: 20 mm MR PISA Radius:  0.70 cm MV E velocity: 121.00 cm/s MV A velocity: 61.80 cm/s MV E/A ratio:  1.96 Keller Paterson Electronically signed by Keller Paterson Signature Date/Time: 04/04/2024/1:25:58 PM  Final    US  Venous Img Lower Bilateral Result Date: 04/03/2024 EXAM: ULTRASOUND DUPLEX OF THE BILATERAL LOWER EXTREMITY VEINS TECHNIQUE: Duplex ultrasound using B-mode/gray scaled imaging and Doppler spectral analysis and color flow was obtained of the deep venous structures of the bilateral lower extremities. COMPARISON: US  Extrem Low Venous 10/21/2023. CLINICAL HISTORY: +ddimer FINDINGS: **RIGHT:** The common femoral vein, femoral vein, popliteal vein, and posterior tibial vein demonstrate normal compressibility with normal color flow and spectral analysis. **LEFT:** Status post harvesting of the left greater saphenous vein. The common femoral vein, femoral vein, popliteal vein, and posterior tibial vein demonstrate normal compressibility with normal color flow and spectral analysis. IMPRESSION: 1. No evidence of DVT. Electronically signed by: Evalene Coho MD 04/03/2024 11:51 AM EST RP Workstation: [id number]   DG Chest Port 1 View Result Date: 04/03/2024 EXAM: 1 VIEW(S) XRAY OF THE CHEST 04/03/2024 05:51:00 AM COMPARISON: 08/14/2023 CLINICAL HISTORY: FINDINGS: LUNGS AND PLEURA: Low lung volumes. Indistinct pulmonary vasculature with cephalization. No focal pulmonary opacity. No pleural effusion. No pneumothorax. HEART AND MEDIASTINUM: Cardiomegaly. BONES AND SOFT TISSUES: No acute osseous abnormality. IMPRESSION: 1. Cardiomegaly. 2. Low lung volumes with indistinct pulmonary vasculature and cephalization, suggestive of pulmonary venous congestion. Electronically signed by: Evalene Coho MD 04/03/2024 06:33 AM EST RP Workstation: [id number]    EKG: Sinus rhythm  ASSESSMENT AND PLAN:  Exertional dyspnea and chest  tightness Minimal flat troponin elevation, type II Coronary artery disease as noted by coronary calcification CT chest Heart failure with preserved EF Hypertension Moderate LVH, likely secondary to history of uncontrolled hypertension CKD stage IV History of stroke  Patient with continued symptoms of exertional dyspnea and intermittent exertional chest tightness. Diuresed with him fluid volume status.  Continue p.o. Lasix . Blood pressure reasonably controlled, continue current antihypertensives. Will add Imdur  60 mg daily as antianginal. Will have nuclear stress test on Monday to evaluate for any significant ischemia. He is on antiplatelet therapy for prior stroke, continue the same Continue statin, Zetia   Signed: Keller JAYSON Paterson MD 04/05/2024, 10:30 AM

## 2024-04-06 DIAGNOSIS — N184 Chronic kidney disease, stage 4 (severe): Secondary | ICD-10-CM | POA: Insufficient documentation

## 2024-04-06 DIAGNOSIS — Z8673 Personal history of transient ischemic attack (TIA), and cerebral infarction without residual deficits: Secondary | ICD-10-CM | POA: Diagnosis not present

## 2024-04-06 DIAGNOSIS — R7989 Other specified abnormal findings of blood chemistry: Secondary | ICD-10-CM

## 2024-04-06 DIAGNOSIS — I5033 Acute on chronic diastolic (congestive) heart failure: Secondary | ICD-10-CM

## 2024-04-06 DIAGNOSIS — I150 Renovascular hypertension: Secondary | ICD-10-CM

## 2024-04-06 DIAGNOSIS — E785 Hyperlipidemia, unspecified: Secondary | ICD-10-CM | POA: Diagnosis not present

## 2024-04-06 DIAGNOSIS — K219 Gastro-esophageal reflux disease without esophagitis: Secondary | ICD-10-CM | POA: Diagnosis not present

## 2024-04-06 DIAGNOSIS — E1122 Type 2 diabetes mellitus with diabetic chronic kidney disease: Secondary | ICD-10-CM

## 2024-04-06 DIAGNOSIS — R600 Localized edema: Secondary | ICD-10-CM

## 2024-04-06 LAB — GLUCOSE, CAPILLARY
Glucose-Capillary: 138 mg/dL — ABNORMAL HIGH (ref 70–99)
Glucose-Capillary: 196 mg/dL — ABNORMAL HIGH (ref 70–99)
Glucose-Capillary: 235 mg/dL — ABNORMAL HIGH (ref 70–99)
Glucose-Capillary: 174 mg/dL — ABNORMAL HIGH (ref 70–99)

## 2024-04-06 NOTE — Assessment & Plan Note (Signed)
Continue with statin and Zetia

## 2024-04-06 NOTE — Assessment & Plan Note (Signed)
 Recent labs consistent with CKD stage IV. Creatinine around baseline. - Monitor renal function -Avoid nephrotoxins

## 2024-04-06 NOTE — Assessment & Plan Note (Signed)
 History of left renal artery stenosis with left kidney atrophy.  Being planned for nephrectomy at [facility]. -Blood pressure mildly elevated this morning -Continue Coreg , Lasix , nifedipine , clonidine 

## 2024-04-06 NOTE — Assessment & Plan Note (Signed)
 Patient was experiencing some chest tightness with exertion. Echocardiogram was negative for any regional wall motion abnormalities. - Stress test on Monday

## 2024-04-06 NOTE — Assessment & Plan Note (Signed)
 Echocardiogram with normal EF and grade 2 diastolic dysfunction.  Initially received IV diuresis which has been switched to p.o. now.  Still having some exertional dyspnea. No hypoxia even with ambulation. Cardiology is on board -Continue with p.o. Lasix 

## 2024-04-06 NOTE — Assessment & Plan Note (Signed)
 Patient with history of type 2 diabetes. -Continue with SSI

## 2024-04-06 NOTE — Plan of Care (Signed)

## 2024-04-06 NOTE — Assessment & Plan Note (Signed)
 Continue with PPI

## 2024-04-06 NOTE — Hospital Course (Addendum)
 Partly taken from prior notes.  Richard Hunt is a pleasant 64 y.o. 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  Patient was recently admitted and discharged from [facility] on 04/02/2024 after being treated for the same problem.  He had a severe hypertension, pulmonary edema and treated with diuresis and blood pressure was controlled breathing was improved and discharged home.  Patient stated that when he goes home he had the similar problem only improves for 6 hours.  And he was not able to breathe this morning and called the ambulance for evaluation.  On presentation vitals stable, chest x-ray with pulmonary edema, D-dimer was mildly elevated but unable to get CTA due to CKD, VQ scan was not revealing due to congestive changes in his lungs.  Patient was diuresed, continue to have exertional dyspnea, orthopnea and chest tightness with ambulation so cardiology was consulted.  Patient will be going for stress test on 04/07/2024.  Imdur  was added.  IV Lasix  has been switched with p.o.  1/4: Vital stable, patient thinks that he need oxygen for home, he was ambulated without any significant desaturation.  Awaiting stress test on Monday.

## 2024-04-06 NOTE — Progress Notes (Signed)
 " Progress Note   Patient: Richard Hunt FMW:[id number] DOB: November 29, 1960 DOA: 04/03/2024     1 DOS: the patient was seen and examined on 04/06/2024   Brief hospital course: Partly taken from prior notes.  Richard Hunt is a pleasant 64 y.o. 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  Patient was recently admitted and discharged from [facility] on 04/02/2024 after being treated for the same problem.  He had a severe hypertension, pulmonary edema and treated with diuresis and blood pressure was controlled breathing was improved and discharged home.  Patient stated that when he goes home he had the similar problem only improves for 6 hours.  And he was not able to breathe this morning and called the ambulance for evaluation.  On presentation vitals stable, chest x-ray with pulmonary edema, D-dimer was mildly elevated but unable to get CTA due to CKD, VQ scan was not revealing due to congestive changes in his lungs.  Patient was diuresed, continue to have exertional dyspnea, orthopnea and chest tightness with ambulation so cardiology was consulted.  Patient will be going for stress test on 04/07/2024.  Imdur  was added.  IV Lasix  has been switched with p.o.  1/4: Vital stable, patient thinks that he need oxygen for home, he was ambulated without any significant desaturation.  Awaiting stress test on Monday.  Assessment and Plan: * Acute on chronic heart failure with preserved ejection fraction (HFpEF) (HCC) Echocardiogram with normal EF and grade 2 diastolic dysfunction.  Initially received IV diuresis which has been switched to p.o. now.  Still having some exertional dyspnea. No hypoxia even  with ambulation. Cardiology is on board -Continue with p.o. Lasix   Renovascular hypertension History of left renal artery stenosis with left kidney atrophy.  Being planned for nephrectomy at [facility]. -Blood pressure mildly elevated this morning -Continue Coreg , Lasix , nifedipine , clonidine   Diabetes (HCC) Patient with history of type 2 diabetes. -Continue with SSI  History of CVA (cerebrovascular accident) without residual deficits - Continue with aspirin , Brilinta  and statin  Elevated troponin Patient was experiencing some chest tightness with exertion. Echocardiogram was negative for any regional wall motion abnormalities. - Stress test on Monday  CKD (chronic kidney disease), stage IV (HCC) Recent labs consistent with CKD stage IV. Creatinine around baseline. - Monitor renal function -Avoid nephrotoxins  HLD (hyperlipidemia) - Continue with statin and Zetia   GERD (gastroesophageal reflux disease) - Continue with PPI   Subjective: Patient was seen and examined today.  Still having some exertional dyspnea.  Patient was asking to prescribe him some home oxygen.  Physical Exam: Vitals:   04/06/24 0500 04/06/24 0828 04/06/24 1447 04/06/24 1709  BP:  (!) 147/82 137/74 (!) 141/72  Pulse:  70 63 66  Resp:  19 18 18   Temp:  98 F (36.7 C) 97.7 F (36.5 C) 97.7 F (36.5 C)  TempSrc:  Oral    SpO2:  100% 99% 100%  Weight: 88.2 kg     Height:       General.  Obese gentleman, in no acute distress. Pulmonary.  Lungs clear bilaterally, normal respiratory effort. CV.  Regular rate and rhythm, no JVD, rub or murmur. Abdomen.  Soft, nontender, nondistended, BS positive. CNS.  Alert and oriented .  No focal  neurologic deficit. Extremities.  No edema,  pulses intact and symmetrical. Psychiatry.  Judgment and insight appears normal.   Data Reviewed: Prior data reviewed  Family Communication: Discussed with patient  Disposition: Status is: Inpatient Remains inpatient  appropriate because: Severity of illness  Planned Discharge Destination: Home  DVT prophylaxis.  Subcu heparin  Time spent: 50 minutes  This record has been created using Conservation officer, historic buildings. Errors have been sought and corrected,but may not always be located. Such creation errors do not reflect on the standard of care.   Author: Amaryllis Dare, MD 04/06/2024 6:12 PM  For on call review [email or web address].  "

## 2024-04-06 NOTE — Progress Notes (Signed)
 [facility] Cardiology  CARDIOLOGY CONSULT NOTE  Patient ID: Richard Hunt MRN: [id number] DOB/AGE: 66-Feb-1962 66 y.o.  Admit date: 04/03/2024 Referring Physician Dr. Caleen Reason for Consultation Chest pain, SOB  HPI: Patient with no complaint this morning.  Resting comfortably in bed.  Review of systems complete and found to be negative unless listed above     Past Medical History:  Diagnosis Date   Acute cholecystitis 12/11/2016   Acute kidney injury (nontraumatic)    Bilateral edema of lower extremity 01/02/2014   Chronic venous insufficiency 01/16/2014   Diabetes (HCC) 12/11/2016   Diabetes mellitus without complication (HCC)    GERD (gastroesophageal reflux disease)    HLD (hyperlipidemia)    HTN (hypertension) 12/11/2016   Hypercholesterolemia    Hypertension    Osteoarthritis    Polycystic kidney disease    Ulcer of calf (HCC) 08/29/2013    Past Surgical History:  Procedure Laterality Date   CHOLECYSTECTOMY N/A 12/11/2016   Procedure: LAPAROSCOPIC CHOLECYSTECTOMY;  Surgeon: Shelva Dunnings, MD;  Location: [facility] ORS;  Service: General;  Laterality: N/A;   VEIN SURGERY      Medications Prior to Admission  Medication Sig Dispense Refill Last Dose/Taking   artificial tears ophthalmic solution Place 1 drop into both eyes as needed for dry eyes. 15 mL 0 Unknown   aspirin  81 MG chewable tablet Chew 1 tablet (81 mg total) by mouth daily. (Patient taking differently: Chew 81 mg by mouth at bedtime.) 30 tablet 2 04/02/2024   atorvastatin  (LIPITOR ) 80 MG tablet Take 1 tablet (80 mg total) by mouth at bedtime. 30 tablet 2 04/02/2024   carvedilol  (COREG ) 12.5 MG tablet Take 12.5 mg by mouth 2 (two) times daily.   04/02/2024   cloNIDine  (CATAPRES  - DOSED IN MG/24 HR) 0.1 mg/24hr patch Place 1 patch onto the skin once a week.   Unknown   Docusate Sodium  (DSS) 100 MG CAPS Take 100 mg by mouth 2 (two) times daily as needed.   Unknown   ezetimibe  (ZETIA ) 10 MG tablet Take 10 mg by mouth  at bedtime.   04/02/2024   glimepiride  (AMARYL ) 1 MG tablet Take 1 mg by mouth daily with breakfast.   04/02/2024   KERENDIA 10 MG TABS Take 1 tablet by mouth daily.   04/02/2024   NIFEdipine  (PROCARDIA  XL/NIFEDICAL XL) 60 MG 24 hr tablet Take 60 mg by mouth daily.   Taking   omeprazole (PRILOSEC) 40 MG capsule Take 40 mg by mouth daily.   04/02/2024   pregabalin  (LYRICA ) 25 MG capsule Take 25 mg by mouth 2 (two) times daily.   04/02/2024   ticagrelor  (BRILINTA ) 90 MG TABS tablet Take 1 tablet (90 mg total) by mouth 2 (two) times daily. 60 tablet 11 04/02/2024   acetaminophen  (TYLENOL ) 325 MG tablet Take 2 tablets (650 mg total) by mouth every 4 (four) hours as needed for mild pain (pain score 1-3) or fever (or temp > 37.5 C (99.5 F)). (Patient not taking: Reported on 12/26/2023)   Not Taking   cloNIDine  (CATAPRES ) 0.1 MG tablet Take 1 tablet (0.1 mg total) by mouth daily as needed (bP >180 systolic). (Patient not taking: Reported on 04/03/2024) 60 tablet 11 Not Taking   gabapentin  (NEURONTIN ) 100 MG capsule Take 100 mg by mouth at bedtime. (Patient not taking: Reported on 04/03/2024)   Not Taking   hydrALAZINE  (APRESOLINE ) 100 MG tablet Take 1 tablet (100 mg total) by mouth 3 (three) times daily. (Patient not taking: Reported on 04/03/2024) 90 tablet  2 Not Taking   hydrochlorothiazide  (HYDRODIURIL ) 25 MG tablet Take 25 mg by mouth daily in the afternoon. (Patient not taking: Reported on 04/03/2024)   Not Taking   losartan  (COZAAR ) 100 MG tablet Take 100 mg by mouth at bedtime. (Patient not taking: Reported on 04/03/2024)   Not Taking   valACYclovir  (VALTREX ) 1000 MG tablet Take 1 tablet (1,000 mg total) by mouth every 12 (twelve) hours. (Patient not taking: Reported on 04/03/2024) 11 tablet 0 Not Taking   verapamil  (CALAN ) 80 MG tablet Take 1 tablet (80 mg total) by mouth every 8 (eight) hours. (Patient not taking: Reported on 04/03/2024) 90 tablet 2 Not Taking   Social History   Socioeconomic History   Marital  status: Married    Spouse name: Not on file   Number of children: Not on file   Years of education: Not on file   Highest education level: Not on file  Occupational History   Not on file  Tobacco Use   Smoking status: Never   Smokeless tobacco: Never  Vaping Use   Vaping status: Never Used  Substance and Sexual Activity   Alcohol  use: No   Drug use: No   Sexual activity: Not on file  Other Topics Concern   Not on file  Social History Narrative   Not on file   Social Drivers of Health   Tobacco Use: Low Risk (04/03/2024)   Patient History    Smoking Tobacco Use: Never    Smokeless Tobacco Use: Never    Passive Exposure: Not on file  Recent Concern: Tobacco Use - Medium Risk (04/01/2024)   [facility]   Patient History    Smoking Tobacco Use: Former    Smokeless Tobacco Use: Never    Passive Exposure: Not on file  Financial Resource Strain: Low Risk  (01/23/2024)   Received from [facility]   Overall Financial Resource Strain (CARDIA)    Difficulty of Paying Living Expenses: Not very hard  Food Insecurity: No Food Insecurity (04/04/2024)   Epic    Worried About Running Out of Food in the Last Year: Never true    Ran Out of Food in the Last Year: Never true  Transportation Needs: No Transportation Needs (04/04/2024)   Epic    Lack of Transportation (Medical): No    Lack of Transportation (Non-Medical): No  Physical Activity: Not on file  Stress: Not on file  Social Connections: Moderately Integrated (04/04/2024)   Social Connection and Isolation Panel    Frequency of Communication with Friends and Family: More than three times a week    Frequency of Social Gatherings with Friends and Family: More than three times a week    Attends Religious Services: 1 to 4 times per year    Active Member of [employer] or Organizations: No    Attends Banker Meetings: Never    Marital Status: Married  Catering Manager Violence: Not At Risk  (04/04/2024)   Epic    Fear of Current or Ex-Partner: No    Emotionally Abused: No    Physically Abused: No    Sexually Abused: No  Depression (PHQ2-9): Not on file  Alcohol  Screen: Not on file  Housing: Low Risk (04/04/2024)   Epic    Unable to Pay for Housing in the Last Year: No    Number of Times Moved in the Last Year: 0    Homeless in the Last Year: No  Utilities: Not At Risk (  04/04/2024)   Epic    Threatened with loss of utilities: No  Health Literacy: Not on file    Family History  Problem Relation Age of Onset   Diabetes Mother       Review of systems complete and found to be negative unless listed above      PHYSICAL EXAM  Alert and oriented x 3.   No significant murmur No wheeze or crackles No pedal edema.  Labs:   Lab Results  Component Value Date   WBC 11.3 (H) 04/05/2024   HGB 12.5 (L) 04/05/2024   HCT 35.3 (L) 04/05/2024   MCV 86.3 04/05/2024   PLT 262 04/05/2024    Recent Labs  Lab 04/04/24 0324 04/05/24 0447  NA 136 137  K 3.4* 3.5  CL 99 100  CO2 23 24  BUN 38* 39*  CREATININE 3.14* 3.15*  CALCIUM  8.6* 8.8*  PROT 6.4*  --   BILITOT 0.5  --   ALKPHOS 111  --   ALT 15  --   AST 15  --   GLUCOSE 143* 153*   Lab Results  Component Value Date   TROPONINI <0.03 12/11/2016    Lab Results  Component Value Date   CHOL 96 12/27/2023   CHOL 138 10/21/2023   CHOL 156 12/11/2016   Lab Results  Component Value Date   HDL 30 (L) 12/27/2023   HDL 41 10/21/2023   HDL 46 12/11/2016   Lab Results  Component Value Date   LDLCALC 50 12/27/2023   LDLCALC 74 10/21/2023   LDLCALC 85 12/11/2016   Lab Results  Component Value Date   TRIG 79 12/27/2023   TRIG 113 10/21/2023   TRIG 127 12/11/2016   Lab Results  Component Value Date   CHOLHDL 3.2 12/27/2023   CHOLHDL 3.4 10/21/2023   CHOLHDL 3.4 12/11/2016   No results found for: LDLDIRECT    Radiology: ECHOCARDIOGRAM COMPLETE Result Date: 04/04/2024    ECHOCARDIOGRAM REPORT    Patient Name:   KINCAID TIGER Date of Exam: 04/04/2024 Medical Rec #:  [id number]               Height:       65.0 in Accession #:    [id number]              Weight:       201.3 lb Date of Birth:  1961-01-05               BSA:          1.984 m Patient Age:    63 years                BP:           149/78 mmHg Patient Gender: M                       HR:           70 bpm. Exam Location:  [facility] Procedure: 2D Echo, 3D Echo, Cardiac Doppler, Color Doppler and Strain Analysis            (Both Spectral and Color Flow Doppler were utilized during            procedure). Indications:     CHF-acute diastolic I50.31  History:         Patient has prior history of Echocardiogram examinations, most  recent 12/27/2023. Risk Factors:Diabetes, Hypertension and                  Dyslipidemia.  Sonographer:     Christopher Furnace Referring Phys:  [id number] [facility] PAUDEL Diagnosing Phys: Keller Paterson  Sonographer Comments: Global longitudinal strain was attempted. IMPRESSIONS  1. Left ventricular ejection fraction, by estimation, is 55 to 60%. The left ventricle has normal function. The left ventricle has no regional wall motion abnormalities. There is moderate concentric left ventricular hypertrophy. Left ventricular diastolic parameters are consistent with Grade II diastolic dysfunction (pseudonormalization).  2. Right ventricular systolic function is normal. The right ventricular size is normal.  3. Left atrial size was mildly dilated.  4. The mitral valve is normal in structure. Mild to moderate mitral valve regurgitation.  5. The aortic valve is tricuspid. Aortic valve regurgitation is not visualized. FINDINGS  Left Ventricle: Left ventricular ejection fraction, by estimation, is 55 to 60%. The left ventricle has normal function. The left ventricle has no regional wall motion abnormalities. The left ventricular internal cavity size was normal in size. There is  moderate concentric left ventricular hypertrophy. Left  ventricular diastolic parameters are consistent with Grade II diastolic dysfunction (pseudonormalization). Right Ventricle: The right ventricular size is normal. No increase in right ventricular wall thickness. Right ventricular systolic function is normal. Left Atrium: Left atrial size was mildly dilated. Right Atrium: Right atrial size was normal in size. Pericardium: There is no evidence of pericardial effusion. Mitral Valve: The mitral valve is normal in structure. Mild to moderate mitral valve regurgitation. MV peak gradient, 9.1 mmHg. The mean mitral valve gradient is 3.0 mmHg. Tricuspid Valve: The tricuspid valve is normal in structure. Tricuspid valve regurgitation is trivial. Aortic Valve: The aortic valve is tricuspid. Aortic valve regurgitation is not visualized. Aortic valve mean gradient measures 7.0 mmHg. Aortic valve peak gradient measures 12.2 mmHg. Aortic valve area, by VTI measures 3.70 cm. Pulmonic Valve: The pulmonic valve was not well visualized. Pulmonic valve regurgitation is not visualized. Aorta: The aortic root and ascending aorta are structurally normal, with no evidence of dilitation. IAS/Shunts: The atrial septum is grossly normal.  LEFT VENTRICLE PLAX 2D LVIDd:         5.20 cm   Diastology LVIDs:         3.40 cm   LV e' medial:    6.53 cm/s LV PW:         1.60 cm   LV E/e' medial:  18.5 LV IVS:        1.50 cm   LV e' lateral:   7.07 cm/s LVOT diam:     2.95 cm   LV E/e' lateral: 17.1 LV SV:         129 LV SV Index:   65        2D Longitudinal Strain LVOT Area:     6.83 cm  2D Strain GLS Avg:     16.6 % LV IVRT:       85 msec  RIGHT VENTRICLE RV Basal diam:  3.30 cm  PULMONARY VEINS RV Mid diam:    3.20 cm  Diastolic Velocity: 24.20 cm/s                          S/D Velocity:       2.90  Systolic Velocity:  69.40 cm/s LEFT ATRIUM              Index        RIGHT ATRIUM           Index LA diam:        4.80 cm  2.42 cm/m   RA Area:     15.20 cm LA Vol (A2C):    53.3 ml  26.87 ml/m  RA Volume:   35.80 ml  18.05 ml/m LA Vol (A4C):   100.0 ml 50.42 ml/m LA Biplane Vol: 79.4 ml  40.03 ml/m  AORTIC VALVE AV Area (Vmax):    3.55 cm AV Area (Vmean):   3.36 cm AV Area (VTI):     3.70 cm AV Vmax:           175.00 cm/s AV Vmean:          122.000 cm/s AV VTI:            0.349 m AV Peak Grad:      12.2 mmHg AV Mean Grad:      7.0 mmHg LVOT Vmax:         91.00 cm/s LVOT Vmean:        60.000 cm/s LVOT VTI:          0.189 m LVOT/AV VTI ratio: 0.54  AORTA Ao Root diam: 3.60 cm MITRAL VALVE                  TRICUSPID VALVE MV Area (PHT): 4.74 cm       TR Peak grad:   13.0 mmHg MV Area VTI:   3.37 cm       TR Vmax:        180.00 cm/s MV Peak grad:  9.1 mmHg MV Mean grad:  3.0 mmHg       SHUNTS MV Vmax:       1.51 m/s       Systemic VTI:  0.19 m MV Vmean:      83.7 cm/s      Systemic Diam: 2.95 cm MV Decel Time: 160 msec MR Peak grad:    125.4 mmHg MR Mean grad:    84.0 mmHg MR Vmax:         560.00 cm/s MR Vmean:        431.0 cm/s MR PISA:         3.08 cm MR PISA Eff ROA: 20 mm MR PISA Radius:  0.70 cm MV E velocity: 121.00 cm/s MV A velocity: 61.80 cm/s MV E/A ratio:  1.96 Keller Agustin Swatek Electronically signed by Keller Paterson Signature Date/Time: 04/04/2024/1:25:58 PM    Final    US  Venous Img Lower Bilateral Result Date: 04/03/2024 EXAM: ULTRASOUND DUPLEX OF THE BILATERAL LOWER EXTREMITY VEINS TECHNIQUE: Duplex ultrasound using B-mode/gray scaled imaging and Doppler spectral analysis and color flow was obtained of the deep venous structures of the bilateral lower extremities. COMPARISON: US  Extrem Low Venous 10/21/2023. CLINICAL HISTORY: +ddimer FINDINGS: **RIGHT:** The common femoral vein, femoral vein, popliteal vein, and posterior tibial vein demonstrate normal compressibility with normal color flow and spectral analysis. **LEFT:** Status post harvesting of the left greater saphenous vein. The common femoral vein, femoral vein, popliteal vein, and posterior tibial vein  demonstrate normal compressibility with normal color flow and spectral analysis. IMPRESSION: 1. No evidence of DVT. Electronically signed by: Evalene Coho MD 04/03/2024 11:51 AM EST RP Workstation: [id number]   DG Chest Port 1 View Result Date: 04/03/2024 EXAM:  1 VIEW(S) XRAY OF THE CHEST 04/03/2024 05:51:00 AM COMPARISON: 08/14/2023 CLINICAL HISTORY: FINDINGS: LUNGS AND PLEURA: Low lung volumes. Indistinct pulmonary vasculature with cephalization. No focal pulmonary opacity. No pleural effusion. No pneumothorax. HEART AND MEDIASTINUM: Cardiomegaly. BONES AND SOFT TISSUES: No acute osseous abnormality. IMPRESSION: 1. Cardiomegaly. 2. Low lung volumes with indistinct pulmonary vasculature and cephalization, suggestive of pulmonary venous congestion. Electronically signed by: Evalene Coho MD 04/03/2024 06:33 AM EST RP Workstation: [id number]    ASSESSMENT AND PLAN:  Exertional dyspnea and chest tightness Minimal flat troponin elevation, type II Coronary artery disease as noted by coronary calcification CT chest Heart failure with preserved EF Hypertension Moderate LVH, likely secondary to history of uncontrolled hypertension CKD stage IV History of stroke   Patient with symptoms of exertional dyspnea and intermittent exertional chest tightness. Diuresed with improved volume status.  Continue p.o. Lasix . Blood pressure reasonably controlled, continue current antihypertensives. Imdur  60 mg daily added yesterday Will have nuclear stress test tomorrow to evaluate for any significant ischemia. He is on antiplatelet therapy for prior stroke, continue the same Continue statin, Zetia     Signed: Keller JAYSON Paterson MD 04/06/2024, 9:03 AM

## 2024-04-06 NOTE — Assessment & Plan Note (Signed)
-   Continue with aspirin , Brilinta  and statin

## 2024-04-07 ENCOUNTER — Inpatient Hospital Stay

## 2024-04-07 ENCOUNTER — Other Ambulatory Visit: Payer: Self-pay

## 2024-04-07 DIAGNOSIS — N184 Chronic kidney disease, stage 4 (severe): Secondary | ICD-10-CM | POA: Diagnosis not present

## 2024-04-07 DIAGNOSIS — Z8673 Personal history of transient ischemic attack (TIA), and cerebral infarction without residual deficits: Secondary | ICD-10-CM | POA: Diagnosis not present

## 2024-04-07 DIAGNOSIS — E785 Hyperlipidemia, unspecified: Secondary | ICD-10-CM | POA: Diagnosis not present

## 2024-04-07 DIAGNOSIS — R0602 Shortness of breath: Principal | ICD-10-CM

## 2024-04-07 DIAGNOSIS — E1122 Type 2 diabetes mellitus with diabetic chronic kidney disease: Secondary | ICD-10-CM | POA: Diagnosis not present

## 2024-04-07 DIAGNOSIS — I150 Renovascular hypertension: Secondary | ICD-10-CM | POA: Diagnosis not present

## 2024-04-07 DIAGNOSIS — R0789 Other chest pain: Secondary | ICD-10-CM

## 2024-04-07 DIAGNOSIS — K219 Gastro-esophageal reflux disease without esophagitis: Secondary | ICD-10-CM | POA: Diagnosis not present

## 2024-04-07 DIAGNOSIS — J810 Acute pulmonary edema: Secondary | ICD-10-CM | POA: Diagnosis not present

## 2024-04-07 DIAGNOSIS — I5033 Acute on chronic diastolic (congestive) heart failure: Secondary | ICD-10-CM | POA: Diagnosis not present

## 2024-04-07 DIAGNOSIS — R7989 Other specified abnormal findings of blood chemistry: Secondary | ICD-10-CM | POA: Diagnosis not present

## 2024-04-07 LAB — RENAL FUNCTION PANEL
Albumin: 3.6 g/dL (ref 3.5–5.0)
Anion gap: 13 (ref 5–15)
BUN: 35 mg/dL — ABNORMAL HIGH (ref 8–23)
CO2: 26 mmol/L (ref 22–32)
Calcium: 8.9 mg/dL (ref 8.9–10.3)
Chloride: 99 mmol/L (ref 98–111)
Creatinine, Ser: 3.27 mg/dL — ABNORMAL HIGH (ref 0.61–1.24)
GFR, Estimated: 20 mL/min — ABNORMAL LOW
Glucose, Bld: 164 mg/dL — ABNORMAL HIGH (ref 70–99)
Phosphorus: 3.8 mg/dL (ref 2.5–4.6)
Potassium: 3.8 mmol/L (ref 3.5–5.1)
Sodium: 137 mmol/L (ref 135–145)

## 2024-04-07 LAB — NM MYOCAR MULTI W/SPECT W/WALL MOTION / EF
Estimated workload: 1
Exercise duration (min): 1 min
Exercise duration (sec): 0 s
LV dias vol: 183 mL (ref 62–150)
LV sys vol: 108 mL
MPHR: 155 {beats}/min
Nuc Stress EF: 41 %
Peak HR: 85 {beats}/min
Percent HR: 54 %
Rest HR: 76 {beats}/min
Rest Nuclear Isotope Dose: 10.6 mCi
SDS: 1
SRS: 0
SSS: 2
ST Depression (mm): 0 mm
Stress Nuclear Isotope Dose: 29.8 mCi
TID: 1.03

## 2024-04-07 LAB — GLUCOSE, CAPILLARY
Glucose-Capillary: 135 mg/dL — ABNORMAL HIGH (ref 70–99)
Glucose-Capillary: 199 mg/dL — ABNORMAL HIGH (ref 70–99)

## 2024-04-07 MED ORDER — TECHNETIUM TC 99M TETROFOSMIN IV KIT
10.6400 | PACK | Freq: Once | INTRAVENOUS | Status: AC | PRN
  Administered 2024-04-07: 10.64 via INTRAVENOUS

## 2024-04-07 MED ORDER — FUROSEMIDE 40 MG PO TABS
40.0000 mg | ORAL_TABLET | Freq: Every day | ORAL | 1 refills | Status: AC
  Filled 2024-04-07: qty 30, 30d supply, fill #0

## 2024-04-07 MED ORDER — LACTULOSE 10 GM/15ML PO SOLN
20.0000 g | Freq: Two times a day (BID) | ORAL | 0 refills | Status: AC
  Filled 2024-04-07: qty 236, 4d supply, fill #0

## 2024-04-07 MED ORDER — TECHNETIUM TC 99M TETROFOSMIN IV KIT
30.0000 | PACK | Freq: Once | INTRAVENOUS | Status: AC | PRN
  Administered 2024-04-07: 29.81 via INTRAVENOUS

## 2024-04-07 MED ORDER — REGADENOSON 0.4 MG/5ML IV SOLN
0.4000 mg | Freq: Once | INTRAVENOUS | Status: AC
  Administered 2024-04-07: 0.4 mg via INTRAVENOUS

## 2024-04-07 MED ORDER — ISOSORBIDE MONONITRATE ER 60 MG PO TB24
60.0000 mg | ORAL_TABLET | Freq: Every day | ORAL | 1 refills | Status: AC
  Filled 2024-04-07: qty 30, 30d supply, fill #0

## 2024-04-07 NOTE — Discharge Summary (Signed)
 " Physician Discharge Summary   Patient: Richard Hunt MRN: [id number] DOB: [birth year]  Admit date:     04/03/2024  Discharge date: 04/07/2024  Discharge Physician: Amaryllis Dare   PCP: [facility]   Recommendations at discharge:  Please obtain CBC and BMP on follow-up Follow-up with cardiology Follow-up with his nephrology Follow-up with primary care provider  Discharge Diagnoses: Principal Problem:   Acute on chronic heart failure with preserved ejection fraction (HFpEF) (HCC) Active Problems:   Renovascular hypertension   Diabetes (HCC)   History of CVA (cerebrovascular accident) without residual deficits   Elevated troponin   CKD (chronic kidney disease), stage IV (HCC)   HLD (hyperlipidemia)   GERD (gastroesophageal reflux disease)   Bilateral edema of lower extremity   SOB (shortness of breath)   Acute pulmonary edema (HCC)   Chest pressure   Hospital Course: Partly taken from prior notes.  Richard Hunt is a pleasant 64 y.o. 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  Patient was recently admitted and discharged from [facility] on 04/02/2024 after being treated for the same problem.  He had a severe hypertension, pulmonary edema and treated with diuresis and blood pressure was controlled breathing was improved and discharged home.  Patient stated that when he goes home he had the similar problem only improves for 6 hours.  And he was not able to breathe this morning and called the ambulance for evaluation.  On presentation vitals stable, chest x-ray with pulmonary edema, D-dimer was mildly elevated but unable to get CTA due to CKD, VQ scan was not  revealing due to congestive changes in his lungs.  Patient was diuresed, continue to have exertional dyspnea, orthopnea and chest tightness with ambulation so cardiology was consulted.  Patient will be going for stress test on 04/07/2024.  Imdur  was added.  IV Lasix  has been switched with p.o.  1/4: Vital stable, patient thinks that he need oxygen for home, he was ambulated without any significant desaturation.    1/5: Remained hemodynamically stable.  Stress test was done today and it was negative for any reversible ischemia.  Patient was started on p.o. Lasix  and Imdur  during current hospitalization.  He was advised to start taking medications according to the new prescription.  He will continue most of his home medications what he was taking it before current hospitalization and need to have a close follow-up with his cardiologist and nephrologist for further assistance.  Assessment and Plan: * Acute on chronic heart failure with preserved ejection fraction (HFpEF) (HCC) Echocardiogram with normal EF and grade 2 diastolic dysfunction.  Initially received IV diuresis which has been switched to p.o. now.  Still having some exertional dyspnea. No hypoxia even with ambulation. Cardiology is on board -Continue with p.o. Lasix   Renovascular hypertension History of left renal artery stenosis with left kidney atrophy.  Being planned for nephrectomy at [facility]. -Blood pressure mildly elevated this morning -Continue Coreg , Lasix , nifedipine , clonidine   Diabetes (HCC) Patient with history of type 2 diabetes. He was managed with SSI during hospitalization and will continue home medications on discharge.  History of CVA (cerebrovascular accident) without residual deficits - Continue with aspirin , Brilinta  and statin  Elevated troponin Patient was experiencing some chest tightness with exertion. Echocardiogram  was negative for any regional wall motion abnormalities. - Stress test on Monday-was negative  for any reversible ischemia  CKD (chronic kidney disease), stage IV (HCC) Recent labs consistent with CKD stage IV. Creatinine around baseline. - Monitor renal function -Avoid nephrotoxins  HLD (hyperlipidemia) - Continue with statin and Zetia   GERD (gastroesophageal reflux disease) - Continue with PPI   Consultants: Cardiology Procedures performed: NM Myoview  Disposition: Home Diet recommendation:  Cardiac and Carb modified diet DISCHARGE MEDICATION: Allergies as of 04/07/2024   No Known Allergies      Medication List     STOP taking these medications    cloNIDine  0.1 MG tablet Commonly known as: CATAPRES    gabapentin  100 MG capsule Commonly known as: NEURONTIN    hydrALAZINE  100 MG tablet Commonly known as: APRESOLINE    hydrochlorothiazide  25 MG tablet Commonly known as: HYDRODIURIL    losartan  100 MG tablet Commonly known as: COZAAR    valACYclovir  1000 MG tablet Commonly known as: VALTREX    verapamil  80 MG tablet Commonly known as: CALAN        TAKE these medications    acetaminophen  325 MG tablet Commonly known as: TYLENOL  Take 2 tablets (650 mg total) by mouth every 4 (four) hours as needed for mild pain (pain score 1-3) or fever (or temp > 37.5 C (99.5 F)).   artificial tears ophthalmic solution Place 1 drop into both eyes as needed for dry eyes.   Aspirin  Low Dose 81 MG chewable tablet Generic drug: aspirin  Masticar 1 tableta (81 mg en total) por va oral diariamente. (Chew 1 tablet (81 mg total) by mouth daily.) What changed: when to take this   atorvastatin  80 MG tablet Commonly known as: LIPITOR  Tome 1 tableta (80 mg en total) por va oral antes de acostarse. (Take 1 tablet (80 mg total) by mouth at bedtime.)   carvedilol  12.5 MG tablet Commonly known as: COREG  Take 12.5 mg by mouth 2 (two) times daily.   cloNIDine  0.1 mg/24hr patch Commonly known as: CATAPRES  - Dosed in mg/24 hr Place 1 patch onto the skin once a week.   DSS 100  MG Caps Take 100 mg by mouth 2 (two) times daily as needed.   ezetimibe  10 MG tablet Commonly known as: ZETIA  Take 10 mg by mouth at bedtime.   furosemide  40 MG tablet Commonly known as: LASIX  Tome 1 tableta (40 mg en total) por va oral diariamente. (Take 1 tablet (40 mg total) by mouth daily.) Start taking on: April 08, 2024   glimepiride  1 MG tablet Commonly known as: AMARYL  Take 1 mg by mouth daily with breakfast.   isosorbide  mononitrate 60 MG 24 hr tablet Commonly known as: IMDUR  Tome 1 tableta (60 mg en total) por va oral diariamente. (Take 1 tablet (60 mg total) by mouth daily.) Start taking on: April 08, 2024   Kerendia 10 MG Tabs Generic drug: Finerenone Take 1 tablet by mouth daily.   lactulose  10 GM/15ML solution Commonly known as: CHRONULAC  Take 30 mLs (20 g total) by mouth 2 (two) times daily.   NIFEdipine  60 MG 24 hr tablet Commonly known as: PROCARDIA  XL/NIFEDICAL XL Take 60 mg by mouth daily.   omeprazole 40 MG capsule Commonly known as: PRILOSEC Take 40 mg by mouth daily.   pregabalin  25 MG capsule Commonly known as: LYRICA  Take 25 mg by mouth 2 (two) times daily.   ticagrelor  90 MG Tabs tablet Commonly known as: BRILINTA  Tome 1 tableta (90 mg en total) por va oral 2 (dos) veces  al da. (Take 1 tablet (90 mg total) by mouth 2 (two) times daily.)        Follow-up Information     Alluri, Keller BROCKS, MD. Go in 1 week(s).   Specialty: Cardiology Why: Appointment scheduled for Contact information: [address] [place] [address] [phone]         [facility]. Schedule an appointment as soon as possible for a visit in 1 week(s).   Contact information: [address] [place] [address] [phone]                Discharge Exam: Fredricka Weights   04/05/24 0500 04/06/24 0500 04/07/24 0500  Weight: 90.2 kg 88.2 kg 88.2 kg   General.  Well-developed gentleman, in no acute  distress. Pulmonary.  Lungs clear bilaterally, normal respiratory effort. CV.  Regular rate and rhythm, no JVD, rub or murmur. Abdomen.  Soft, nontender, nondistended, BS positive. CNS.  Alert and oriented .  No focal neurologic deficit. Extremities.  No edema,  pulses intact and symmetrical. Psychiatry.  Judgment and insight appears normal.   Condition at discharge: stable  The results of significant diagnostics from this hospitalization (including imaging, microbiology, ancillary and laboratory) are listed below for reference.   Imaging Studies: ECHOCARDIOGRAM COMPLETE Result Date: 04/04/2024    ECHOCARDIOGRAM REPORT   Patient Name:   IBN STIEF Date of Exam: 04/04/2024 Medical Rec #:  [id number]               Height:       65.0 in Accession #:    [id number]              Weight:       201.3 lb Date of Birth:  1960/12/29               BSA:          1.984 m Patient Age:    63 years                BP:           149/78 mmHg Patient Gender: M                       HR:           70 bpm. Exam Location:  [facility] Procedure: 2D Echo, 3D Echo, Cardiac Doppler, Color Doppler and Strain Analysis            (Both Spectral and Color Flow Doppler were utilized during            procedure). Indications:     CHF-acute diastolic I50.31  History:         Patient has prior history of Echocardiogram examinations, most                  recent 12/27/2023. Risk Factors:Diabetes, Hypertension and                  Dyslipidemia.  Sonographer:     Christopher Furnace Referring Phys:  [id number] [facility] PAUDEL Diagnosing Phys: Keller Paterson  Sonographer Comments: Global longitudinal strain was attempted. IMPRESSIONS  1. Left ventricular ejection fraction, by estimation, is 55 to 60%. The left ventricle has normal function. The left ventricle has no regional wall motion abnormalities. There is moderate concentric left ventricular hypertrophy. Left ventricular diastolic parameters are consistent with Grade II diastolic dysfunction  (pseudonormalization).  2. Right ventricular systolic function is normal. The right  ventricular size is normal.  3. Left atrial size was mildly dilated.  4. The mitral valve is normal in structure. Mild to moderate mitral valve regurgitation.  5. The aortic valve is tricuspid. Aortic valve regurgitation is not visualized. FINDINGS  Left Ventricle: Left ventricular ejection fraction, by estimation, is 55 to 60%. The left ventricle has normal function. The left ventricle has no regional wall motion abnormalities. The left ventricular internal cavity size was normal in size. There is  moderate concentric left ventricular hypertrophy. Left ventricular diastolic parameters are consistent with Grade II diastolic dysfunction (pseudonormalization). Right Ventricle: The right ventricular size is normal. No increase in right ventricular wall thickness. Right ventricular systolic function is normal. Left Atrium: Left atrial size was mildly dilated. Right Atrium: Right atrial size was normal in size. Pericardium: There is no evidence of pericardial effusion. Mitral Valve: The mitral valve is normal in structure. Mild to moderate mitral valve regurgitation. MV peak gradient, 9.1 mmHg. The mean mitral valve gradient is 3.0 mmHg. Tricuspid Valve: The tricuspid valve is normal in structure. Tricuspid valve regurgitation is trivial. Aortic Valve: The aortic valve is tricuspid. Aortic valve regurgitation is not visualized. Aortic valve mean gradient measures 7.0 mmHg. Aortic valve peak gradient measures 12.2 mmHg. Aortic valve area, by VTI measures 3.70 cm. Pulmonic Valve: The pulmonic valve was not well visualized. Pulmonic valve regurgitation is not visualized. Aorta: The aortic root and ascending aorta are structurally normal, with no evidence of dilitation. IAS/Shunts: The atrial septum is grossly normal.  LEFT VENTRICLE PLAX 2D LVIDd:         5.20 cm   Diastology LVIDs:         3.40 cm   LV e' medial:    6.53 cm/s LV PW:          1.60 cm   LV E/e' medial:  18.5 LV IVS:        1.50 cm   LV e' lateral:   7.07 cm/s LVOT diam:     2.95 cm   LV E/e' lateral: 17.1 LV SV:         129 LV SV Index:   65        2D Longitudinal Strain LVOT Area:     6.83 cm  2D Strain GLS Avg:     16.6 % LV IVRT:       85 msec  RIGHT VENTRICLE RV Basal diam:  3.30 cm  PULMONARY VEINS RV Mid diam:    3.20 cm  Diastolic Velocity: 24.20 cm/s                          S/D Velocity:       2.90                          Systolic Velocity:  69.40 cm/s LEFT ATRIUM              Index        RIGHT ATRIUM           Index LA diam:        4.80 cm  2.42 cm/m   RA Area:     15.20 cm LA Vol (A2C):   53.3 ml  26.87 ml/m  RA Volume:   35.80 ml  18.05 ml/m LA Vol (A4C):   100.0 ml 50.42 ml/m LA Biplane Vol: 79.4 ml  40.03 ml/m  AORTIC VALVE AV Area (  Vmax):    3.55 cm AV Area (Vmean):   3.36 cm AV Area (VTI):     3.70 cm AV Vmax:           175.00 cm/s AV Vmean:          122.000 cm/s AV VTI:            0.349 m AV Peak Grad:      12.2 mmHg AV Mean Grad:      7.0 mmHg LVOT Vmax:         91.00 cm/s LVOT Vmean:        60.000 cm/s LVOT VTI:          0.189 m LVOT/AV VTI ratio: 0.54  AORTA Ao Root diam: 3.60 cm MITRAL VALVE                  TRICUSPID VALVE MV Area (PHT): 4.74 cm       TR Peak grad:   13.0 mmHg MV Area VTI:   3.37 cm       TR Vmax:        180.00 cm/s MV Peak grad:  9.1 mmHg MV Mean grad:  3.0 mmHg       SHUNTS MV Vmax:       1.51 m/s       Systemic VTI:  0.19 m MV Vmean:      83.7 cm/s      Systemic Diam: 2.95 cm MV Decel Time: 160 msec MR Peak grad:    125.4 mmHg MR Mean grad:    84.0 mmHg MR Vmax:         560.00 cm/s MR Vmean:        431.0 cm/s MR PISA:         3.08 cm MR PISA Eff ROA: 20 mm MR PISA Radius:  0.70 cm MV E velocity: 121.00 cm/s MV A velocity: 61.80 cm/s MV E/A ratio:  1.96 Keller Alluri Electronically signed by Keller Paterson Signature Date/Time: 04/04/2024/1:25:58 PM    Final    US  Venous Img Lower Bilateral Result Date: 04/03/2024 EXAM: ULTRASOUND  DUPLEX OF THE BILATERAL LOWER EXTREMITY VEINS TECHNIQUE: Duplex ultrasound using B-mode/gray scaled imaging and Doppler spectral analysis and color flow was obtained of the deep venous structures of the bilateral lower extremities. COMPARISON: US  Extrem Low Venous 10/21/2023. CLINICAL HISTORY: +ddimer FINDINGS: **RIGHT:** The common femoral vein, femoral vein, popliteal vein, and posterior tibial vein demonstrate normal compressibility with normal color flow and spectral analysis. **LEFT:** Status post harvesting of the left greater saphenous vein. The common femoral vein, femoral vein, popliteal vein, and posterior tibial vein demonstrate normal compressibility with normal color flow and spectral analysis. IMPRESSION: 1. No evidence of DVT. Electronically signed by: Evalene Coho MD 04/03/2024 11:51 AM EST RP Workstation: [id number]   DG Chest Port 1 View Result Date: 04/03/2024 EXAM: 1 VIEW(S) XRAY OF THE CHEST 04/03/2024 05:51:00 AM COMPARISON: 08/14/2023 CLINICAL HISTORY: FINDINGS: LUNGS AND PLEURA: Low lung volumes. Indistinct pulmonary vasculature with cephalization. No focal pulmonary opacity. No pleural effusion. No pneumothorax. HEART AND MEDIASTINUM: Cardiomegaly. BONES AND SOFT TISSUES: No acute osseous abnormality. IMPRESSION: 1. Cardiomegaly. 2. Low lung volumes with indistinct pulmonary vasculature and cephalization, suggestive of pulmonary venous congestion. Electronically signed by: Evalene Coho MD 04/03/2024 06:33 AM EST RP Workstation: [id number]    Microbiology: Results for orders placed or performed during the hospital encounter of 04/03/24  Resp panel by RT-PCR (RSV, Flu A&B, Covid) Anterior Nasal Swab  Status: None   Collection Time: 04/03/24 10:30 AM   Specimen: Anterior Nasal Swab  Result Value Ref Range Status   SARS Coronavirus 2 by RT PCR NEGATIVE NEGATIVE Final    Comment: (NOTE) SARS-CoV-2 target nucleic acids are NOT DETECTED.  The SARS-CoV-2 RNA is generally  detectable in upper respiratory specimens during the acute phase of infection. The lowest concentration of SARS-CoV-2 viral copies this assay can detect is 138 copies/mL. A negative result does not preclude SARS-Cov-2 infection and should not be used as the sole basis for treatment or other patient management decisions. A negative result may occur with  improper specimen collection/handling, submission of specimen other than nasopharyngeal swab, presence of viral mutation(s) within the areas targeted by this assay, and inadequate number of viral copies(<138 copies/mL). A negative result must be combined with clinical observations, patient history, and epidemiological information. The expected result is Negative.  Fact Sheet for Patients:  bloggercourse.com  Fact Sheet for Healthcare Providers:  seriousbroker.it  This test is no t yet approved or cleared by the [place]  FDA and  has been authorized for detection and/or diagnosis of SARS-CoV-2 by FDA under an Emergency Use Authorization (EUA). This EUA will remain  in effect (meaning this test can be used) for the duration of the COVID-19 declaration under Section 564(b)(1) of the Act, 21 U.S.C.section 360bbb-3(b)(1), unless the authorization is terminated  or revoked sooner.       Influenza A by PCR NEGATIVE NEGATIVE Final   Influenza B by PCR NEGATIVE NEGATIVE Final    Comment: (NOTE) The Xpert Xpress SARS-CoV-2/FLU/RSV plus assay is intended as an aid in the diagnosis of influenza from Nasopharyngeal swab specimens and should not be used as a sole basis for treatment. Nasal washings and aspirates are unacceptable for Xpert Xpress SARS-CoV-2/FLU/RSV testing.  Fact Sheet for Patients: bloggercourse.com  Fact Sheet for Healthcare Providers: seriousbroker.it  This test is not yet approved or cleared by the [place]  FDA  and has been authorized for detection and/or diagnosis of SARS-CoV-2 by FDA under an Emergency Use Authorization (EUA). This EUA will remain in effect (meaning this test can be used) for the duration of the COVID-19 declaration under Section 564(b)(1) of the Act, 21 U.S.C. section 360bbb-3(b)(1), unless the authorization is terminated or revoked.     Resp Syncytial Virus by PCR NEGATIVE NEGATIVE Final    Comment: (NOTE) Fact Sheet for Patients: bloggercourse.com  Fact Sheet for Healthcare Providers: seriousbroker.it  This test is not yet approved or cleared by the [place]  FDA and has been authorized for detection and/or diagnosis of SARS-CoV-2 by FDA under an Emergency Use Authorization (EUA). This EUA will remain in effect (meaning this test can be used) for the duration of the COVID-19 declaration under Section 564(b)(1) of the Act, 21 U.S.C. section 360bbb-3(b)(1), unless the authorization is terminated or revoked.  Performed at [facility], [address] [place], [place] [address]     Labs: CBC: Recent Labs  Lab 04/03/24 [phone] 04/03/24 1030 04/04/24 0324 04/05/24 0447  WBC 10.8* 10.3 8.7 11.3*  NEUTROABS 8.0*  --   --   --   HGB 13.7 13.0 12.4* 12.5*  HCT 38.7* 35.9* 34.6* 35.3*  MCV 85.6 85.3 86.7 86.3  PLT 295 280 246 262   Basic Metabolic Panel: Recent Labs  Lab 04/03/24 0635 04/03/24 1030 04/04/24 0324 04/05/24 0447 04/07/24 0509  NA 130*  --  136 137 137  K 4.2  --  3.4* 3.5 3.8  CL 96*  --  99 100 99  CO2 19*  --  23 24 26   GLUCOSE 194*  --  143* 153* 164*  BUN 41*  --  38* 39* 35*  CREATININE 3.16* 2.97* 3.14* 3.15* 3.27*  CALCIUM  9.2  --  8.6* 8.8* 8.9  PHOS  --   --   --   --  3.8   Liver Function Tests: Recent Labs  Lab 04/03/24 0635 04/04/24 0324 04/07/24 0509  AST 17 15  --   ALT 17 15  --   ALKPHOS 144* 111  --   BILITOT 0.6 0.5  --   PROT 7.6 6.4*  --    ALBUMIN 4.1 3.7 3.6   CBG: Recent Labs  Lab 04/06/24 1445 04/06/24 1713 04/06/24 2146 04/07/24 0821 04/07/24 1223  GLUCAP 196* 235* 174* 135* 199*    Discharge time spent: greater than 30 minutes.  This record has been created using Conservation officer, historic buildings. Errors have been sought and corrected,but may not always be located. Such creation errors do not reflect on the standard of care.   Signed: Amaryllis Dare, MD [facility] 04/07/2024 "

## 2024-04-07 NOTE — Care Management Important Message (Signed)
 Important Message  Patient Details  Name: Richard Hunt MRN: [id number] Date of Birth: 21-Jul-1960   Important Message Given:  Yes - Medicare IM     [name] 04/07/2024, 2:34 PM

## 2024-04-07 NOTE — Progress Notes (Signed)
 Heart Failure Navigator Progress Note Assessed for Heart & Vascular TOC clinic readiness.  Does not meet criteria due to current [facility] patient.  Navigator will sign off at this time.  Charmaine Pines, RN, BSN [facility] Heart Failure Navigator Secure Chat Only

## 2024-04-07 NOTE — Progress Notes (Addendum)
 " CARDIOLOGY PROGRESS NOTE         Patient ID: Richard Hunt MRN: [id number] DOB/AGE: Jan 20, 1961 64 y.o.  Admit date: 04/03/2024 Referring Physician Dr. Brigida Bureau Reason for Consultation shortness of breath, chest discomfort  HPI: 64 year old male on whom our service is consulted for shortness of breath, chest discomfort. He has a past medical history significant for coronary artery disease as noted by three-vessel coronary artery calcification CT chest, resistant hypertension, heart failure with preserved EF, type 2 diabetes, hyperlipidemia, history of stroke, left renal artery stenosis with atrophic kidney, CKD stage IV.  Patient was recently at outside hospital with shortness of breath and elevated blood pressure also had bradycardia.  Treated with diuresis and medications were adjusted.  There was also concern for noncompliance with meds at home.  He again presented with shortness of breath with exertion and chest tightness.  Chest x-ray showed pulmonary vascular congestion.  BNP elevated.  Troponin minimal and flat.  EKG showed sinus rhythm without any ischemic changes.  Echocardiogram with normal biventricular systolic function, no wall motion abnormality, no significant valvular abnormality, mild to moderate MR.  Moderate LVH  Review of systems complete and found to be negative unless listed above     Past Medical History:  Diagnosis Date   Acute cholecystitis 12/11/2016   Acute kidney injury (nontraumatic)    Bilateral edema of lower extremity 01/02/2014   Chronic venous insufficiency 01/16/2014   Diabetes (HCC) 12/11/2016   Diabetes mellitus without complication (HCC)    GERD (gastroesophageal reflux disease)    HLD (hyperlipidemia)    HTN (hypertension) 12/11/2016   Hypercholesterolemia    Hypertension    Osteoarthritis    Polycystic kidney disease    Ulcer of calf (HCC) 08/29/2013    Past Surgical History:  Procedure Laterality Date   CHOLECYSTECTOMY N/A 12/11/2016    Procedure: LAPAROSCOPIC CHOLECYSTECTOMY;  Surgeon: Shelva Dunnings, MD;  Location: [facility] ORS;  Service: General;  Laterality: N/A;   VEIN SURGERY      Medications Prior to Admission  Medication Sig Dispense Refill Last Dose/Taking   artificial tears ophthalmic solution Place 1 drop into both eyes as needed for dry eyes. 15 mL 0 Unknown   aspirin  81 MG chewable tablet Chew 1 tablet (81 mg total) by mouth daily. (Patient taking differently: Chew 81 mg by mouth at bedtime.) 30 tablet 2 04/02/2024   atorvastatin  (LIPITOR ) 80 MG tablet Take 1 tablet (80 mg total) by mouth at bedtime. 30 tablet 2 04/02/2024   carvedilol  (COREG ) 12.5 MG tablet Take 12.5 mg by mouth 2 (two) times daily.   04/02/2024   cloNIDine  (CATAPRES  - DOSED IN MG/24 HR) 0.1 mg/24hr patch Place 1 patch onto the skin once a week.   Unknown   Docusate Sodium  (DSS) 100 MG CAPS Take 100 mg by mouth 2 (two) times daily as needed.   Unknown   ezetimibe  (ZETIA ) 10 MG tablet Take 10 mg by mouth at bedtime.   04/02/2024   glimepiride  (AMARYL ) 1 MG tablet Take 1 mg by mouth daily with breakfast.   04/02/2024   KERENDIA 10 MG TABS Take 1 tablet by mouth daily.   04/02/2024   NIFEdipine  (PROCARDIA  XL/NIFEDICAL XL) 60 MG 24 hr tablet Take 60 mg by mouth daily.   Taking   omeprazole (PRILOSEC) 40 MG capsule Take 40 mg by mouth daily.   04/02/2024   pregabalin  (LYRICA ) 25 MG capsule Take 25 mg by mouth 2 (two) times daily.   04/02/2024  ticagrelor  (BRILINTA ) 90 MG TABS tablet Take 1 tablet (90 mg total) by mouth 2 (two) times daily. 60 tablet 11 04/02/2024   acetaminophen  (TYLENOL ) 325 MG tablet Take 2 tablets (650 mg total) by mouth every 4 (four) hours as needed for mild pain (pain score 1-3) or fever (or temp > 37.5 C (99.5 F)). (Patient not taking: Reported on 12/26/2023)   Not Taking   cloNIDine  (CATAPRES ) 0.1 MG tablet Take 1 tablet (0.1 mg total) by mouth daily as needed (bP >180 systolic). (Patient not taking: Reported on 04/03/2024) 60  tablet 11 Not Taking   gabapentin  (NEURONTIN ) 100 MG capsule Take 100 mg by mouth at bedtime. (Patient not taking: Reported on 04/03/2024)   Not Taking   hydrALAZINE  (APRESOLINE ) 100 MG tablet Take 1 tablet (100 mg total) by mouth 3 (three) times daily. (Patient not taking: Reported on 04/03/2024) 90 tablet 2 Not Taking   hydrochlorothiazide  (HYDRODIURIL ) 25 MG tablet Take 25 mg by mouth daily in the afternoon. (Patient not taking: Reported on 04/03/2024)   Not Taking   losartan  (COZAAR ) 100 MG tablet Take 100 mg by mouth at bedtime. (Patient not taking: Reported on 04/03/2024)   Not Taking   valACYclovir  (VALTREX ) 1000 MG tablet Take 1 tablet (1,000 mg total) by mouth every 12 (twelve) hours. (Patient not taking: Reported on 04/03/2024) 11 tablet 0 Not Taking   verapamil  (CALAN ) 80 MG tablet Take 1 tablet (80 mg total) by mouth every 8 (eight) hours. (Patient not taking: Reported on 04/03/2024) 90 tablet 2 Not Taking   Social History   Socioeconomic History   Marital status: Married    Spouse name: Not on file   Number of children: Not on file   Years of education: Not on file   Highest education level: Not on file  Occupational History   Not on file  Tobacco Use   Smoking status: Never   Smokeless tobacco: Never  Vaping Use   Vaping status: Never Used  Substance and Sexual Activity   Alcohol  use: No   Drug use: No   Sexual activity: Not on file  Other Topics Concern   Not on file  Social History Narrative   Not on file   Social Drivers of Health   Tobacco Use: Low Risk (04/03/2024)   Patient History    Smoking Tobacco Use: Never    Smokeless Tobacco Use: Never    Passive Exposure: Not on file  Recent Concern: Tobacco Use - Medium Risk (04/01/2024)   Received from [facility]   Patient History    Smoking Tobacco Use: Former    Smokeless Tobacco Use: Never    Passive Exposure: Not on file  Financial Resource Strain: Low Risk  (01/23/2024)   [facility]   Overall Financial Resource Strain (CARDIA)    Difficulty of Paying Living Expenses: Not very hard  Food Insecurity: No Food Insecurity (04/04/2024)   Epic    Worried About Running Out of Food in the Last Year: Never true    Ran Out of Food in the Last Year: Never true  Transportation Needs: No Transportation Needs (04/04/2024)   Epic    Lack of Transportation (Medical): No    Lack of Transportation (Non-Medical): No  Physical Activity: Not on file  Stress: Not on file  Social Connections: Moderately Integrated (04/04/2024)   Social Connection and Isolation Panel    Frequency of Communication with Friends and Family: More than three times a week  Frequency of Social Gatherings with Friends and Family: More than three times a week    Attends Religious Services: 1 to 4 times per year    Active Member of [employer] or Organizations: No    Attends Banker Meetings: Never    Marital Status: Married  Catering Manager Violence: Not At Risk (04/04/2024)   Epic    Fear of Current or Ex-Partner: No    Emotionally Abused: No    Physically Abused: No    Sexually Abused: No  Depression (PHQ2-9): Not on file  Alcohol  Screen: Not on file  Housing: Low Risk (04/04/2024)   Epic    Unable to Pay for Housing in the Last Year: No    Number of Times Moved in the Last Year: 0    Homeless in the Last Year: No  Utilities: Not At Risk (04/04/2024)   Epic    Threatened with loss of utilities: No  Health Literacy: Not on file    Family History  Problem Relation Age of Onset   Diabetes Mother       Review of systems complete and found to be negative unless listed above   PHYSICAL EXAM General: Well appearing male, well nourished, in no acute distress. HEENT: Normocephalic and atraumatic. Neck: No JVD.  Lungs: Normal respiratory effort on room air. Clear bilaterally to auscultation. No wheezes, crackles, rhonchi.  Heart: HRRR. Normal S1 and S2 without gallops or murmurs. Radial & DP  pulses 2+ bilaterally. Abdomen: Non-distended appearing.  Msk: Normal strength and tone for age. Extremities: No clubbing, cyanosis or edema.   Neuro: Alert and oriented X 3. Psych: Mood appropriate, affect congruent.    Labs:   Lab Results  Component Value Date   WBC 11.3 (H) 04/05/2024   HGB 12.5 (L) 04/05/2024   HCT 35.3 (L) 04/05/2024   MCV 86.3 04/05/2024   PLT 262 04/05/2024    Recent Labs  Lab 04/04/24 0324 04/05/24 0447 04/07/24 0509  NA 136   < > 137  K 3.4*   < > 3.8  CL 99   < > 99  CO2 23   < > 26  BUN 38*   < > 35*  CREATININE 3.14*   < > 3.27*  CALCIUM  8.6*   < > 8.9  PROT 6.4*  --   --   BILITOT 0.5  --   --   ALKPHOS 111  --   --   ALT 15  --   --   AST 15  --   --   GLUCOSE 143*   < > 164*   < > = values in this interval not displayed.   Lab Results  Component Value Date   TROPONINI <0.03 12/11/2016    Lab Results  Component Value Date   CHOL 96 12/27/2023   CHOL 138 10/21/2023   CHOL 156 12/11/2016   Lab Results  Component Value Date   HDL 30 (L) 12/27/2023   HDL 41 10/21/2023   HDL 46 12/11/2016   Lab Results  Component Value Date   LDLCALC 50 12/27/2023   LDLCALC 74 10/21/2023   LDLCALC 85 12/11/2016   Lab Results  Component Value Date   TRIG 79 12/27/2023   TRIG 113 10/21/2023   TRIG 127 12/11/2016   Lab Results  Component Value Date   CHOLHDL 3.2 12/27/2023   CHOLHDL 3.4 10/21/2023   CHOLHDL 3.4 12/11/2016   No results found for: LDLDIRECT    Radiology: ECHOCARDIOGRAM  COMPLETE Result Date: 04/04/2024    ECHOCARDIOGRAM REPORT   Patient Name:   GARDINER ESPANA Date of Exam: 04/04/2024 Medical Rec #:  [id number]               Height:       65.0 in Accession #:    [id number]              Weight:       201.3 lb Date of Birth:  11/17/1960               BSA:          1.984 m Patient Age:    63 years                BP:           149/78 mmHg Patient Gender: M                       HR:           70 bpm. Exam Location:  [facility]  Procedure: 2D Echo, 3D Echo, Cardiac Doppler, Color Doppler and Strain Analysis            (Both Spectral and Color Flow Doppler were utilized during            procedure). Indications:     CHF-acute diastolic I50.31  History:         Patient has prior history of Echocardiogram examinations, most                  recent 12/27/2023. Risk Factors:Diabetes, Hypertension and                  Dyslipidemia.  Sonographer:     Christopher Furnace Referring Phys:  [id number] [facility] Diagnosing Phys: Keller Paterson  Sonographer Comments: Global longitudinal strain was attempted. IMPRESSIONS  1. Left ventricular ejection fraction, by estimation, is 55 to 60%. The left ventricle has normal function. The left ventricle has no regional wall motion abnormalities. There is moderate concentric left ventricular hypertrophy. Left ventricular diastolic parameters are consistent with Grade II diastolic dysfunction (pseudonormalization).  2. Right ventricular systolic function is normal. The right ventricular size is normal.  3. Left atrial size was mildly dilated.  4. The mitral valve is normal in structure. Mild to moderate mitral valve regurgitation.  5. The aortic valve is tricuspid. Aortic valve regurgitation is not visualized. FINDINGS  Left Ventricle: Left ventricular ejection fraction, by estimation, is 55 to 60%. The left ventricle has normal function. The left ventricle has no regional wall motion abnormalities. The left ventricular internal cavity size was normal in size. There is  moderate concentric left ventricular hypertrophy. Left ventricular diastolic parameters are consistent with Grade II diastolic dysfunction (pseudonormalization). Right Ventricle: The right ventricular size is normal. No increase in right ventricular wall thickness. Right ventricular systolic function is normal. Left Atrium: Left atrial size was mildly dilated. Right Atrium: Right atrial size was normal in size. Pericardium: There is no evidence of  pericardial effusion. Mitral Valve: The mitral valve is normal in structure. Mild to moderate mitral valve regurgitation. MV peak gradient, 9.1 mmHg. The mean mitral valve gradient is 3.0 mmHg. Tricuspid Valve: The tricuspid valve is normal in structure. Tricuspid valve regurgitation is trivial. Aortic Valve: The aortic valve is tricuspid. Aortic valve regurgitation is not visualized. Aortic valve mean gradient measures 7.0 mmHg. Aortic valve peak gradient measures 12.2 mmHg. Aortic valve area, by  VTI measures 3.70 cm. Pulmonic Valve: The pulmonic valve was not well visualized. Pulmonic valve regurgitation is not visualized. Aorta: The aortic root and ascending aorta are structurally normal, with no evidence of dilitation. IAS/Shunts: The atrial septum is grossly normal.  LEFT VENTRICLE PLAX 2D LVIDd:         5.20 cm   Diastology LVIDs:         3.40 cm   LV e' medial:    6.53 cm/s LV PW:         1.60 cm   LV E/e' medial:  18.5 LV IVS:        1.50 cm   LV e' lateral:   7.07 cm/s LVOT diam:     2.95 cm   LV E/e' lateral: 17.1 LV SV:         129 LV SV Index:   65        2D Longitudinal Strain LVOT Area:     6.83 cm  2D Strain GLS Avg:     16.6 % LV IVRT:       85 msec  RIGHT VENTRICLE RV Basal diam:  3.30 cm  PULMONARY VEINS RV Mid diam:    3.20 cm  Diastolic Velocity: 24.20 cm/s                          S/D Velocity:       2.90                          Systolic Velocity:  69.40 cm/s LEFT ATRIUM              Index        RIGHT ATRIUM           Index LA diam:        4.80 cm  2.42 cm/m   RA Area:     15.20 cm LA Vol (A2C):   53.3 ml  26.87 ml/m  RA Volume:   35.80 ml  18.05 ml/m LA Vol (A4C):   100.0 ml 50.42 ml/m LA Biplane Vol: 79.4 ml  40.03 ml/m  AORTIC VALVE AV Area (Vmax):    3.55 cm AV Area (Vmean):   3.36 cm AV Area (VTI):     3.70 cm AV Vmax:           175.00 cm/s AV Vmean:          122.000 cm/s AV VTI:            0.349 m AV Peak Grad:      12.2 mmHg AV Mean Grad:      7.0 mmHg LVOT Vmax:          91.00 cm/s LVOT Vmean:        60.000 cm/s LVOT VTI:          0.189 m LVOT/AV VTI ratio: 0.54  AORTA Ao Root diam: 3.60 cm MITRAL VALVE                  TRICUSPID VALVE MV Area (PHT): 4.74 cm       TR Peak grad:   13.0 mmHg MV Area VTI:   3.37 cm       TR Vmax:        180.00 cm/s MV Peak grad:  9.1 mmHg MV Mean grad:  3.0 mmHg       SHUNTS MV Vmax:  1.51 m/s       Systemic VTI:  0.19 m MV Vmean:      83.7 cm/s      Systemic Diam: 2.95 cm MV Decel Time: 160 msec MR Peak grad:    125.4 mmHg MR Mean grad:    84.0 mmHg MR Vmax:         560.00 cm/s MR Vmean:        431.0 cm/s MR PISA:         3.08 cm MR PISA Eff ROA: 20 mm MR PISA Radius:  0.70 cm MV E velocity: 121.00 cm/s MV A velocity: 61.80 cm/s MV E/A ratio:  1.96 Keller Alluri Electronically signed by Keller Paterson Signature Date/Time: 04/04/2024/1:25:58 PM    Final    US  Venous Img Lower Bilateral Result Date: 04/03/2024 EXAM: ULTRASOUND DUPLEX OF THE BILATERAL LOWER EXTREMITY VEINS TECHNIQUE: Duplex ultrasound using B-mode/gray scaled imaging and Doppler spectral analysis and color flow was obtained of the deep venous structures of the bilateral lower extremities. COMPARISON: US  Extrem Low Venous 10/21/2023. CLINICAL HISTORY: +ddimer FINDINGS: **RIGHT:** The common femoral vein, femoral vein, popliteal vein, and posterior tibial vein demonstrate normal compressibility with normal color flow and spectral analysis. **LEFT:** Status post harvesting of the left greater saphenous vein. The common femoral vein, femoral vein, popliteal vein, and posterior tibial vein demonstrate normal compressibility with normal color flow and spectral analysis. IMPRESSION: 1. No evidence of DVT. Electronically signed by: Evalene Coho MD 04/03/2024 11:51 AM EST RP Workstation: [id number]   DG Chest Port 1 View Result Date: 04/03/2024 EXAM: 1 VIEW(S) XRAY OF THE CHEST 04/03/2024 05:51:00 AM COMPARISON: 08/14/2023 CLINICAL HISTORY: FINDINGS: LUNGS AND PLEURA: Low lung  volumes. Indistinct pulmonary vasculature with cephalization. No focal pulmonary opacity. No pleural effusion. No pneumothorax. HEART AND MEDIASTINUM: Cardiomegaly. BONES AND SOFT TISSUES: No acute osseous abnormality. IMPRESSION: 1. Cardiomegaly. 2. Low lung volumes with indistinct pulmonary vasculature and cephalization, suggestive of pulmonary venous congestion. Electronically signed by: Evalene Coho MD 04/03/2024 06:33 AM EST RP Workstation: [id number]    Telemetry (personally reviewed by me 04/07/2024): Not on tele  ASSESSMENT AND PLAN:  64 year old male on whom our service is consulted for shortness of breath, chest discomfort. He has a past medical history significant for coronary artery disease as noted by three-vessel coronary artery calcification CT chest, resistant hypertension, heart failure with preserved EF, type 2 diabetes, hyperlipidemia, history of stroke, left renal artery stenosis with atrophic kidney, CKD stage IV.  # Exertional dyspnea and chest tightness # Minimal flat troponin elevation, type II # Coronary artery disease as noted by coronary calcification CT chest # Heart failure with preserved EF # Hypertension # Moderate LVH, likely 2/2 history of uncontrolled hypertension # CKD stage IV # History of stroke Patient with symptoms of exertional dyspnea and intermittent exertional chest tightness. EKG non ischemic. Troponins 46 > 45, BNP 1064.  Echo this admission with EF 55-60%, moderate LVH, grade 2 diastolic dysfunction, mild to moderate MR. - Plan for nuclear stress test today, further recommendations pending these results. - Continue p.o. Lasix  40 mg daily. - Continue carvedilol  12.5 mg twice daily, Imdur  60 mg daily, nifedipine  60 mg daily. - Continue aspirin  81 mg daily and Brilinta  90 mg twice daily (on DAPT for prior stroke). - Continue Zetia  10 mg daily and atorvastatin  80 mg daily.  ADDENDUM: Nuclear stress without ischemia. Ok for DC from cardiac  perspective. Follow up with Dr. Paterson scheduled for next week.   This patient's plan of  care was discussed and created with Dr. Florencio and he is in agreement.   Signed: Danita Bloch, PA-C  04/07/2024, 9:06 AM [facility]       "

## 2024-04-17 DIAGNOSIS — Z23 Encounter for immunization: Secondary | ICD-10-CM | POA: Insufficient documentation

## 2024-04-17 DIAGNOSIS — Z79899 Other long term (current) drug therapy: Secondary | ICD-10-CM | POA: Insufficient documentation

## 2024-04-17 DIAGNOSIS — Z7982 Long term (current) use of aspirin: Secondary | ICD-10-CM | POA: Diagnosis not present

## 2024-04-17 DIAGNOSIS — N189 Chronic kidney disease, unspecified: Secondary | ICD-10-CM | POA: Diagnosis not present

## 2024-04-17 DIAGNOSIS — W260XXA Contact with knife, initial encounter: Secondary | ICD-10-CM | POA: Diagnosis not present

## 2024-04-17 DIAGNOSIS — E1122 Type 2 diabetes mellitus with diabetic chronic kidney disease: Secondary | ICD-10-CM | POA: Diagnosis not present

## 2024-04-17 DIAGNOSIS — S41131A Puncture wound without foreign body of right upper arm, initial encounter: Secondary | ICD-10-CM | POA: Diagnosis present

## 2024-04-17 DIAGNOSIS — I129 Hypertensive chronic kidney disease with stage 1 through stage 4 chronic kidney disease, or unspecified chronic kidney disease: Secondary | ICD-10-CM | POA: Diagnosis not present

## 2024-04-17 NOTE — ED Triage Notes (Addendum)
 Pt presents for puncture wound to left arm after accidentally slicing self with a knife. On Brilinta . Active bleeding noted. Rewrapped in triage. PMS intact.

## 2024-04-18 ENCOUNTER — Emergency Department
Admission: EM | Admit: 2024-04-18 | Discharge: 2024-04-18 | Disposition: A | Source: Home / Self Care | Attending: Emergency Medicine | Admitting: Emergency Medicine

## 2024-04-18 DIAGNOSIS — S41131A Puncture wound without foreign body of right upper arm, initial encounter: Secondary | ICD-10-CM | POA: Diagnosis not present

## 2024-04-18 DIAGNOSIS — T148XXA Other injury of unspecified body region, initial encounter: Secondary | ICD-10-CM

## 2024-04-18 MED ORDER — TETANUS-DIPHTH-ACELL PERTUSSIS 5-2-15.5 LF-MCG/0.5 IM SUSP
0.5000 mL | Freq: Once | INTRAMUSCULAR | Status: AC
  Administered 2024-04-18: 0.5 mL via INTRAMUSCULAR
  Filled 2024-04-18: qty 0.5

## 2024-04-18 NOTE — ED Provider Notes (Signed)
 "  [facility] Provider Note    Event Date/Time   First MD Initiated Contact with Patient 04/18/24 [phone]     (approximate)   History   Puncture Wound (/)   HPI  Richard Hunt is a 64 y.o. male with history of hypertension, diabetes, hyperlipidemia, chronic kidney disease who presents to the emergency department after he was accidentally cut with a knife in the right upper extremity.  Family reports that he was sharpening knives when his dog became excited and bumped into him.  He has a small puncture wound to the upper arm.  They were unable to get the bleeding to stop at home.  Pressure dressing was applied in triage and bleeding currently controlled.  Patient is on aspirin  and Brilinta .   History provided by patient, family.    Past Medical History:  Diagnosis Date   Acute cholecystitis 12/11/2016   Acute kidney injury (nontraumatic)    Bilateral edema of lower extremity 01/02/2014   Chronic venous insufficiency 01/16/2014   Diabetes (HCC) 12/11/2016   Diabetes mellitus without complication (HCC)    GERD (gastroesophageal reflux disease)    HLD (hyperlipidemia)    HTN (hypertension) 12/11/2016   Hypercholesterolemia    Hypertension    Osteoarthritis    Polycystic kidney disease    Ulcer of calf (HCC) 08/29/2013    Past Surgical History:  Procedure Laterality Date   CHOLECYSTECTOMY N/A 12/11/2016   Procedure: LAPAROSCOPIC CHOLECYSTECTOMY;  Surgeon: Shelva Dunnings, MD;  Location: [facility] ORS;  Service: General;  Laterality: N/A;   VEIN SURGERY      MEDICATIONS:  Prior to Admission medications  Medication Sig Start Date End Date Taking? Authorizing Provider  acetaminophen  (TYLENOL ) 325 MG tablet Take 2 tablets (650 mg total) by mouth every 4 (four) hours as needed for mild pain (pain score 1-3) or fever (or temp > 37.5 C (99.5 F)). Patient not taking: Reported on 12/26/2023 10/26/23   Fausto Sor A, DO  artificial tears ophthalmic solution  Place 1 drop into both eyes as needed for dry eyes. 12/29/23   Samtani, Jai-Gurmukh, MD  aspirin  81 MG chewable tablet Chew 1 tablet (81 mg total) by mouth daily. Patient taking differently: Chew 81 mg by mouth at bedtime. 10/27/23   Fausto Sor A, DO  atorvastatin  (LIPITOR ) 80 MG tablet Take 1 tablet (80 mg total) by mouth at bedtime. 10/26/23   Fausto Sor LABOR, DO  carvedilol  (COREG ) 12.5 MG tablet Take 12.5 mg by mouth 2 (two) times daily.    [provider]  cloNIDine  (CATAPRES  - DOSED IN MG/24 HR) 0.1 mg/24hr patch Place 1 patch onto the skin once a week. 04/02/24 07/01/24  [provider]  Docusate Sodium  (DSS) 100 MG CAPS Take 100 mg by mouth 2 (two) times daily as needed. 01/29/24   [provider]  ezetimibe  (ZETIA ) 10 MG tablet Take 10 mg by mouth at bedtime. 04/02/24 07/01/24  [provider]  furosemide  (LASIX ) 40 MG tablet Take 1 tablet (40 mg total) by mouth daily. 04/08/24   Caleen Qualia, MD  glimepiride  (AMARYL ) 1 MG tablet Take 1 mg by mouth daily with breakfast.    [provider]  isosorbide  mononitrate (IMDUR ) 60 MG 24 hr tablet Take 1 tablet (60 mg total) by mouth daily. 04/08/24   Amin, Sumayya, MD  KERENDIA 10 MG TABS Take 1 tablet by mouth daily. 01/07/24   [provider]  lactulose  (CHRONULAC ) 10 GM/15ML solution Take 30 mLs (20 g  total) by mouth 2 (two) times daily. 04/07/24   Amin, Sumayya, MD  NIFEdipine  (PROCARDIA  XL/NIFEDICAL XL) 60 MG 24 hr tablet Take 60 mg by mouth daily. 04/03/24 05/08/25  [provider]  omeprazole (PRILOSEC) 40 MG capsule Take 40 mg by mouth daily.    [provider]  pregabalin  (LYRICA ) 25 MG capsule Take 25 mg by mouth 2 (two) times daily. 02/27/24   [provider]  ticagrelor  (BRILINTA ) 90 MG TABS tablet Take 1 tablet (90 mg total) by mouth 2 (two) times daily. 12/29/23   Samtani, Jai-Gurmukh, MD    Physical Exam   Triage Vital Signs: ED Triage Vitals [04/17/24 2259]   Encounter Vitals Group     BP (!) 181/86     Girls Systolic BP Percentile      Girls Diastolic BP Percentile      Boys Systolic BP Percentile      Boys Diastolic BP Percentile      Pulse Rate 80     Resp 18     Temp 97.6 F (36.4 C)     Temp Source Oral     SpO2 99 %     Weight      Height      Head Circumference      Peak Flow      Pain Score 6     Pain Loc      Pain Education      Exclude from Growth Chart     Most recent vital signs: Vitals:   04/17/24 2259  BP: (!) 181/86  Pulse: 80  Resp: 18  Temp: 97.6 F (36.4 C)  SpO2: 99%     CONSTITUTIONAL: Alert and responds appropriately to questions. Well-appearing; well-nourished HEAD: Normocephalic, atraumatic EYES: Conjunctivae clear, pupils appear equal ENT: normal nose; moist mucous membranes NECK: Normal range of motion CARD: Regular rate and rhythm RESP: Normal chest excursion without splinting or tachypnea; no hypoxia or respiratory distress, speaking full sentences ABD/GI: non-distended EXT: Normal ROM in all joints, no major deformities noted, no bony tenderness or bony deformity, no ecchymosis, no soft tissue swelling, 2+ right radial pulse, compartments soft, normal capillary refill SKIN: Normal color for age and race, no rashes on exposed skin, 1 cm superficial puncture wound to the right upper extremity without active bleeding NEURO: Moves all extremities equally, normal speech, no facial asymmetry noted PSYCH: The patient's mood and manner are appropriate. Grooming and personal hygiene are appropriate.  ED Results / Procedures / Treatments   LABS: (all labs ordered are listed, but only abnormal results are displayed) Labs Reviewed - No data to display   EKG:   RADIOLOGY: My personal review and interpretation of imaging:    I have personally reviewed all radiology reports. No results found.   PROCEDURES:  Critical Care performed: No  LACERATION REPAIR Performed by: Josette Sink Authorized by: Josette Sink Consent: Verbal consent obtained. Risks and benefits: risks, benefits and alternatives were discussed Consent given by: patient Patient identity confirmed: provided demographic data Prepped and Draped in normal sterile fashion Wound explored  Laceration Location: Right upper arm  Laceration Length: 1cm  No Foreign Bodies seen or palpated  Anesthesia: None  Amount of cleaning: standard  Skin closure: Using Dermabond  Technique: Area cleaned with alcohol  swab and closed using Dermabond.  Patient tolerance: Patient tolerated the procedure well with no immediate complications.    Procedures    IMPRESSION / MDM / ASSESSMENT AND PLAN / ED COURSE  I  reviewed the triage vital signs and the nursing notes.   Patient here with small puncture wound to the upper extremity.  Was bleeding at home.  Pressure bandage placed in triage and now no longer bleeding.     DIFFERENTIAL DIAGNOSIS (includes but not limited to):   Puncture wound, bleeding secondary to antiplatelets/anticoagulants, doubt arterial injury, doubt anemia  Patient's presentation is most consistent with acute complicated illness / injury requiring diagnostic workup.  PLAN: Patient here with small superficial puncture wound to the right upper arm.  Bleeding currently controlled.  Area cleaned and closed with Dermabond.  No hematoma on exam.  Compartments soft.  Neurovascular intact distally.  Hemodynamically stable.  Discussed bleeding return precautions and supportive care instructions.  Recommended Tylenol  as needed over-the-counter.  Tetanus vaccine updated today.   MEDICATIONS GIVEN IN ED: Medications  Tdap (ADACEL ) injection 0.5 mL (0.5 mLs Intramuscular Given 04/18/24 0134)     ED COURSE:  At this time, I do not feel there is any life-threatening condition present. I reviewed all nursing notes, vitals, pertinent previous records.  All lab and urine results, EKGs, imaging ordered  have been independently reviewed and interpreted by myself.  I reviewed all available radiology reports from any imaging ordered this visit.  Based on my assessment, I feel the patient is safe to be discharged home without further emergent workup and can continue workup as an outpatient as needed. Discussed all findings, treatment plan as well as usual and customary return precautions.  They verbalize understanding and are comfortable with this plan.  Outpatient follow-up has been provided as needed.  All questions have been answered.    CONSULTS:  none   OUTSIDE RECORDS REVIEWED: Reviewed recent internal medicine note.     FINAL CLINICAL IMPRESSION(S) / ED DIAGNOSES   Final diagnoses:  Puncture wound     Rx / DC Orders   ED Discharge Orders     None        Note:  This document was prepared using Dragon voice recognition software and may include unintentional dictation errors.   Grete Bosko, Josette SAILOR, DO 04/18/24 [phone]  "

## 2024-04-18 NOTE — Discharge Instructions (Signed)
 You may use over-the-counter Tylenol  1000 mg every 6 hours as needed for pain.  We have updated your tetanus vaccine today.  We have repaired your laceration with glue and it is no longer bleeding.  If begins to bleed again, please hold direct pressure for 30 minutes without stopping.  If this does not stop the bleeding, please return to the emergency department.

## 2024-04-24 NOTE — Progress Notes (Unsigned)
 "  Advanced Heart Failure Clinic Note   Referring Physician: 01/26 admission PCP: Cletus Glenn Cardiologist: None   Chief Complaint:    HPI:  Richard Hunt is a 64 y.o. 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 Patient was recently admitted and discharged from [facility] on 04/02/2024 after being treated for the same problem. On presentation vitals stable, chest x-ray with pulmonary edema, D-dimer was mildly elevated but unable to get CTA due to CKD, VQ scan was not revealing due to congestive changes in his lungs. Echo 04/04/24: EF 55-60%, moderate LVH, G2DD, normal RV, mild/ moderate MR. Stress test was done today and it was negative for any reversible ischemia. IV diuresed with transition to oral diuretics.   Admitted 04/10/24 with severe episodes of chest pressure and shortness of breath. He was found to have AKI due to overdiuresis and sleep study findings of severe obstructive sleep apnea. Pt underwent sleep study with impressive findings as follows. He was provided a home CPAP machine. Severe mixed sleep apnea with an overall RDI/3%AHI = 59.3. The respiratory events were associated with oxygen desaturation to a low of 68% and severe fragmentation of sleep architecture. Lasix  held due to worsening renal function.     He presents today for his initial HF visit with a chief complaint of    Review of Systems: [y] = yes, [ ]  = no   General: Weight gain [ ] ; Weight loss [ ] ; Anorexia [ ] ; Fatigue [ ] ; Fever [ ] ; Chills [ ] ; Weakness [ ]   Cardiac: Chest pain/pressure [ ] ; Resting SOB [ ] ; Exertional SOB [ ] ; Orthopnea [ ] ; Pedal Edema [ ] ; Palpitations [ ] ; Syncope [ ] ; Presyncope [ ] ; Paroxysmal nocturnal dyspnea[ ]   Pulmonary: Cough [ ] ; Wheezing[ ] ; Hemoptysis[ ] ; Sputum [ ] ; Snoring [ ]   GI: Vomiting[ ] ; Dysphagia[ ] ;  Melena[ ] ; Hematochezia [ ] ; Heartburn[ ] ; Abdominal pain [ ] ; Constipation [ ] ; Diarrhea [ ] ; BRBPR [ ]   GU: Hematuria[ ] ; Dysuria [ ] ; Nocturia[ ]   Vascular: Pain in legs with walking [ ] ; Pain in feet with lying flat [ ] ; Non-healing sores [ ] ; Stroke [ ] ; TIA [ ] ; Slurred speech [ ] ;  Neuro: Headaches[ ] ; Vertigo[ ] ; Seizures[ ] ; Paresthesias[ ] ;Blurred vision [ ] ; Diplopia [ ] ; Vision changes [ ]   Ortho/Skin: Arthritis [ ] ; Joint pain [ ] ; Muscle pain [ ] ; Joint swelling [ ] ; Back Pain [ ] ; Rash [ ]   Psych: Depression[ ] ; Anxiety[ ]   Heme: Bleeding problems [ ] ; Clotting disorders [ ] ; Anemia [ ]   Endocrine: Diabetes [ ] ; Thyroid dysfunction[ ]    Past Medical History:  Diagnosis Date   Acute cholecystitis 12/11/2016   Acute kidney injury (nontraumatic)    Bilateral edema of lower extremity 01/02/2014   Chronic venous insufficiency 01/16/2014   Diabetes (HCC) 12/11/2016   Diabetes mellitus without complication (HCC)    GERD (gastroesophageal reflux disease)    HLD (hyperlipidemia)    HTN (hypertension) 12/11/2016   Hypercholesterolemia    Hypertension    Osteoarthritis    Polycystic kidney disease    Ulcer of calf (HCC) 08/29/2013    Current Outpatient Medications  Medication Sig Dispense Refill   acetaminophen  (TYLENOL ) 325 MG tablet Take 2 tablets (650 mg total) by mouth every 4 (four) hours as needed for mild pain (pain score 1-3)  or fever (or temp > 37.5 C (99.5 F)). (Patient not taking: Reported on 12/26/2023)     artificial tears ophthalmic solution Place 1 drop into both eyes as needed for dry eyes. 15 mL 0   aspirin  81 MG chewable tablet Chew 1 tablet (81 mg total) by mouth daily. (Patient taking differently: Chew 81 mg by mouth at bedtime.) 30 tablet 2   atorvastatin  (LIPITOR ) 80 MG tablet Take 1 tablet (80 mg total) by mouth at bedtime. 30 tablet 2   carvedilol  (COREG ) 12.5 MG tablet Take 12.5 mg by mouth 2 (two) times daily.     cloNIDine  (CATAPRES  - DOSED IN MG/24 HR) 0.1  mg/24hr patch Place 1 patch onto the skin once a week.     Docusate Sodium  (DSS) 100 MG CAPS Take 100 mg by mouth 2 (two) times daily as needed.     ezetimibe  (ZETIA ) 10 MG tablet Take 10 mg by mouth at bedtime.     furosemide  (LASIX ) 40 MG tablet Take 1 tablet (40 mg total) by mouth daily. 30 tablet 1   glimepiride  (AMARYL ) 1 MG tablet Take 1 mg by mouth daily with breakfast.     isosorbide  mononitrate (IMDUR ) 60 MG 24 hr tablet Take 1 tablet (60 mg total) by mouth daily. 30 tablet 1   KERENDIA 10 MG TABS Take 1 tablet by mouth daily.     lactulose  (CHRONULAC ) 10 GM/15ML solution Take 30 mLs (20 g total) by mouth 2 (two) times daily. 236 mL 0   NIFEdipine  (PROCARDIA  XL/NIFEDICAL XL) 60 MG 24 hr tablet Take 60 mg by mouth daily.     omeprazole (PRILOSEC) 40 MG capsule Take 40 mg by mouth daily.     pregabalin  (LYRICA ) 25 MG capsule Take 25 mg by mouth 2 (two) times daily.     ticagrelor  (BRILINTA ) 90 MG TABS tablet Take 1 tablet (90 mg total) by mouth 2 (two) times daily. 60 tablet 11   No current facility-administered medications for this visit.    Allergies[1]    Social History   Socioeconomic History   Marital status: Married    Spouse name: Not on file   Number of children: Not on file   Years of education: Not on file   Highest education level: Not on file  Occupational History   Not on file  Tobacco Use   Smoking status: Never   Smokeless tobacco: Never  Vaping Use   Vaping status: Never Used  Substance and Sexual Activity   Alcohol  use: No   Drug use: No   Sexual activity: Not on file  Other Topics Concern   Not on file  Social History Narrative   Not on file   Social Drivers of Health   Tobacco Use: Low Risk (04/17/2024)   Patient History    Smoking Tobacco Use: Never    Smokeless Tobacco Use: Never    Passive Exposure: Not on file  Recent Concern: Tobacco Use - Medium Risk (04/11/2024)   Received from [facility]   Patient History    Smoking Tobacco Use:  Former    Smokeless Tobacco Use: Never    Passive Exposure: Not on file  Financial Resource Strain: Low Risk  (01/23/2024)   Received from [facility]   Overall Financial Resource Strain (CARDIA)    Difficulty of Paying Living Expenses: Not very hard  Food Insecurity: No Food Insecurity (04/04/2024)   Epic    Worried About Running Out of Food in the  Last Year: Never true    Ran Out of Food in the Last Year: Never true  Transportation Needs: No Transportation Needs (04/04/2024)   Epic    Lack of Transportation (Medical): No    Lack of Transportation (Non-Medical): No  Physical Activity: Not on file  Stress: Not on file  Social Connections: Moderately Integrated (04/04/2024)   Social Connection and Isolation Panel    Frequency of Communication with Friends and Family: More than three times a week    Frequency of Social Gatherings with Friends and Family: More than three times a week    Attends Religious Services: 1 to 4 times per year    Active Member of [employer] or Organizations: No    Attends Banker Meetings: Never    Marital Status: Married  Catering Manager Violence: Not At Risk (04/04/2024)   Epic    Fear of Current or Ex-Partner: No    Emotionally Abused: No    Physically Abused: No    Sexually Abused: No  Depression (PHQ2-9): Not on file  Alcohol  Screen: Not on file  Housing: Low Risk (04/04/2024)   Epic    Unable to Pay for Housing in the Last Year: No    Number of Times Moved in the Last Year: 0    Homeless in the Last Year: No  Utilities: Not At Risk (04/04/2024)   Epic    Threatened with loss of utilities: No  Health Literacy: Not on file      Family History  Problem Relation Age of Onset   Diabetes Mother        PHYSICAL EXAM: General:  Well appearing. No respiratory difficulty HEENT: normal Neck: supple. no JVD. Carotids 2+ bilat; no bruits. No lymphadenopathy or thyromegaly appreciated. Cor: PMI nondisplaced. Regular rate & rhythm.  No rubs, gallops or murmurs. Lungs: clear Abdomen: soft, nontender, nondistended. No hepatosplenomegaly. No bruits or masses. Good bowel sounds. Extremities: no cyanosis, clubbing, rash, edema Neuro: alert & oriented x 3, cranial nerves grossly intact. moves all 4 extremities w/o difficulty. Affect pleasant.  ECG:   ASSESSMENT & PLAN:   1:HFpEF- -   2: HTN- - BP - BMET  3: T2DM-  4: CKD-  5: CVA-    Ellouise DELENA Class, FNP 04/24/24     [1] No Known Allergies  "

## 2024-04-25 ENCOUNTER — Ambulatory Visit: Admitting: Family

## 2024-04-25 ENCOUNTER — Telehealth: Payer: Self-pay | Admitting: Family

## 2024-04-25 NOTE — Telephone Encounter (Signed)
 Patient did not show for his initial Heart Failure Clinic appointment on 04/25/24.
